# Patient Record
Sex: Female | Born: 1965 | Race: Black or African American | Hispanic: No | Marital: Married | Smoking: Never smoker
Health system: Southern US, Community
[De-identification: ages and names within clinical notes are randomized; demographics above are authoritative.]

## PROBLEM LIST (undated history)

## (undated) DIAGNOSIS — J189 Pneumonia, unspecified organism: Secondary | ICD-10-CM

## (undated) DIAGNOSIS — R0602 Shortness of breath: Secondary | ICD-10-CM

## (undated) DIAGNOSIS — K5900 Constipation, unspecified: Secondary | ICD-10-CM

## (undated) DIAGNOSIS — J3089 Other allergic rhinitis: Secondary | ICD-10-CM

## (undated) DIAGNOSIS — R5383 Other fatigue: Secondary | ICD-10-CM

## (undated) DIAGNOSIS — D649 Anemia, unspecified: Secondary | ICD-10-CM

## (undated) DIAGNOSIS — T8859XA Other complications of anesthesia, initial encounter: Secondary | ICD-10-CM

## (undated) DIAGNOSIS — M549 Dorsalgia, unspecified: Secondary | ICD-10-CM

## (undated) DIAGNOSIS — E785 Hyperlipidemia, unspecified: Secondary | ICD-10-CM

## (undated) DIAGNOSIS — K219 Gastro-esophageal reflux disease without esophagitis: Secondary | ICD-10-CM

## (undated) DIAGNOSIS — J329 Chronic sinusitis, unspecified: Secondary | ICD-10-CM

## (undated) DIAGNOSIS — T4145XA Adverse effect of unspecified anesthetic, initial encounter: Secondary | ICD-10-CM

## (undated) DIAGNOSIS — K2970 Gastritis, unspecified, without bleeding: Secondary | ICD-10-CM

## (undated) DIAGNOSIS — I499 Cardiac arrhythmia, unspecified: Secondary | ICD-10-CM

## (undated) DIAGNOSIS — E119 Type 2 diabetes mellitus without complications: Secondary | ICD-10-CM

## (undated) DIAGNOSIS — M25475 Effusion, left foot: Secondary | ICD-10-CM

## (undated) DIAGNOSIS — R51 Headache: Secondary | ICD-10-CM

## (undated) DIAGNOSIS — B9681 Helicobacter pylori [H. pylori] as the cause of diseases classified elsewhere: Secondary | ICD-10-CM

## (undated) DIAGNOSIS — J45909 Unspecified asthma, uncomplicated: Secondary | ICD-10-CM

## (undated) DIAGNOSIS — E559 Vitamin D deficiency, unspecified: Secondary | ICD-10-CM

## (undated) DIAGNOSIS — E538 Deficiency of other specified B group vitamins: Secondary | ICD-10-CM

## (undated) DIAGNOSIS — R519 Headache, unspecified: Secondary | ICD-10-CM

## (undated) DIAGNOSIS — Z87442 Personal history of urinary calculi: Secondary | ICD-10-CM

## (undated) DIAGNOSIS — Z8489 Family history of other specified conditions: Secondary | ICD-10-CM

## (undated) DIAGNOSIS — T884XXA Failed or difficult intubation, initial encounter: Secondary | ICD-10-CM

## (undated) DIAGNOSIS — M25471 Effusion, right ankle: Secondary | ICD-10-CM

## (undated) DIAGNOSIS — K469 Unspecified abdominal hernia without obstruction or gangrene: Secondary | ICD-10-CM

## (undated) DIAGNOSIS — M1990 Unspecified osteoarthritis, unspecified site: Secondary | ICD-10-CM

## (undated) DIAGNOSIS — F419 Anxiety disorder, unspecified: Secondary | ICD-10-CM

## (undated) DIAGNOSIS — M2550 Pain in unspecified joint: Secondary | ICD-10-CM

## (undated) HISTORY — DX: Dorsalgia, unspecified: M54.9

## (undated) HISTORY — DX: Pain in unspecified joint: M25.50

## (undated) HISTORY — DX: Gastritis, unspecified, without bleeding: K29.70

## (undated) HISTORY — PX: APPENDECTOMY: SHX54

## (undated) HISTORY — PX: COLONOSCOPY W/ BIOPSIES AND POLYPECTOMY: SHX1376

## (undated) HISTORY — PX: BIOPSY ENDOMETRIAL: PRO11

## (undated) HISTORY — DX: Type 2 diabetes mellitus without complications: E11.9

## (undated) HISTORY — DX: Deficiency of other specified B group vitamins: E53.8

## (undated) HISTORY — DX: Hyperlipidemia, unspecified: E78.5

## (undated) HISTORY — DX: Effusion, right ankle: M25.471

## (undated) HISTORY — DX: Shortness of breath: R06.02

## (undated) HISTORY — PX: ROTATOR CUFF REPAIR: SHX139

## (undated) HISTORY — DX: Vitamin D deficiency, unspecified: E55.9

## (undated) HISTORY — DX: Helicobacter pylori (H. pylori) as the cause of diseases classified elsewhere: B96.81

## (undated) HISTORY — DX: Other fatigue: R53.83

## (undated) HISTORY — DX: Constipation, unspecified: K59.00

## (undated) HISTORY — DX: Effusion, left foot: M25.475

---

## 2014-04-21 ENCOUNTER — Emergency Department (HOSPITAL_BASED_OUTPATIENT_CLINIC_OR_DEPARTMENT_OTHER)
Admission: EM | Admit: 2014-04-21 | Discharge: 2014-04-21 | Disposition: A | Discharge status: Home / Self Care | Payer: Managed Care, Other (non HMO) | Attending: Emergency Medicine | Admitting: Emergency Medicine

## 2014-04-21 ENCOUNTER — Encounter (HOSPITAL_BASED_OUTPATIENT_CLINIC_OR_DEPARTMENT_OTHER): Payer: Self-pay | Admitting: Emergency Medicine

## 2014-04-21 ENCOUNTER — Emergency Department (HOSPITAL_BASED_OUTPATIENT_CLINIC_OR_DEPARTMENT_OTHER): Payer: Managed Care, Other (non HMO)

## 2014-04-21 DIAGNOSIS — R102 Pelvic and perineal pain: Secondary | ICD-10-CM

## 2014-04-21 DIAGNOSIS — N949 Unspecified condition associated with female genital organs and menstrual cycle: Secondary | ICD-10-CM | POA: Insufficient documentation

## 2014-04-21 DIAGNOSIS — Z3202 Encounter for pregnancy test, result negative: Secondary | ICD-10-CM | POA: Diagnosis not present

## 2014-04-21 DIAGNOSIS — Z9089 Acquired absence of other organs: Secondary | ICD-10-CM | POA: Diagnosis not present

## 2014-04-21 DIAGNOSIS — Z9889 Other specified postprocedural states: Secondary | ICD-10-CM | POA: Diagnosis not present

## 2014-04-21 DIAGNOSIS — N926 Irregular menstruation, unspecified: Secondary | ICD-10-CM | POA: Insufficient documentation

## 2014-04-21 LAB — URINALYSIS, ROUTINE W REFLEX MICROSCOPIC
Bilirubin Urine: NEGATIVE
GLUCOSE, UA: NEGATIVE mg/dL
Hgb urine dipstick: NEGATIVE
KETONES UR: NEGATIVE mg/dL
Leukocytes, UA: NEGATIVE
Nitrite: NEGATIVE
PROTEIN: NEGATIVE mg/dL
Specific Gravity, Urine: 1.034 — ABNORMAL HIGH (ref 1.005–1.030)
Urobilinogen, UA: 1 mg/dL (ref 0.0–1.0)
pH: 6.5 (ref 5.0–8.0)

## 2014-04-21 LAB — WET PREP, GENITAL
Clue Cells Wet Prep HPF POC: NONE SEEN
TRICH WET PREP: NONE SEEN
Yeast Wet Prep HPF POC: NONE SEEN

## 2014-04-21 LAB — PREGNANCY, URINE: Preg Test, Ur: NEGATIVE

## 2014-04-21 NOTE — ED Notes (Signed)
Pt returned from [place]  Via wheelchair with tech

## 2014-04-21 NOTE — ED Provider Notes (Addendum)
CSN: [id number]     Arrival date & time 04/21/14  1643 History   First MD Initiated Contact with Patient 04/21/14 1654     Chief Complaint  Patient presents with  . Pelvic Pain     (Consider location/radiation/quality/duration/timing/severity/associated sxs/prior Treatment) HPI Complains of left-sided infraumbilical pain, nonradiating onset May 2015. Pain waxes and wanes is worse around the time of her menses. Not affected by food. Improved with urination She treated herself with Tylenol and Advil with partial relief. No nausea or vomiting. No other associated symptoms. No vaginal discharge. Last menstrual period 04/04/2014. No vaginal discharge. No change in bowel habits. History reviewed. No pertinent past medical history. Past Surgical History  Procedure Laterality Date  . Appendectomy    . Biopsy endometrial     diverticulosis No family history on file. History  Substance Use Topics  . Smoking status: Never Smoker   . Smokeless tobacco: Not on file  . Alcohol Use: Yes     Comment: occ   OB History   Grav Para Term Preterm Abortions TAB SAB Ect Mult Living                 Review of Systems  Constitutional: Negative.   HENT: Negative.   Respiratory: Negative.   Cardiovascular: Negative.   Gastrointestinal: Negative.   Genitourinary:       Pelvic pain, irregular menses  Musculoskeletal: Negative.   Skin: Negative.   Neurological: Negative.   Psychiatric/Behavioral: Negative.   All other systems reviewed and are negative.     Allergies  Fish allergy  Home Medications   Prior to Admission medications   Medication Sig Start Date End Date Taking? Authorizing Provider  Cetirizine HCl (ZYRTEC PO) Take by mouth.   Yes Historical Provider, MD   BP 145/84  Pulse 77  Temp(Src) 98.1 F (36.7 C) (Oral)  Resp 18  Ht 5\' 6"  (1.676 m)  Wt 315 lb (142.883 kg)  BMI 50.87 kg/m2  SpO2 98%  LMP 04/04/2014 Physical Exam  Nursing note and vitals reviewed. Constitutional:  She appears well-developed and well-nourished.  HENT:  Head: Normocephalic and atraumatic.  Eyes: Conjunctivae are normal. Pupils are equal, round, and reactive to light.  Neck: Neck supple. No tracheal deviation present. No thyromegaly present.  Cardiovascular: Normal rate and regular rhythm.   No murmur heard. Pulmonary/Chest: Effort normal and breath sounds normal.  Abdominal: Soft. Bowel sounds are normal. She exhibits no distension. There is no tenderness.  Morbidly obese  Genitourinary:  No external lesion cervical os closed. No cervical motion tenderness. No discharge. No blood in vault. Left adnexal tenderness present. No obvious mass  Musculoskeletal: Normal range of motion. She exhibits no edema and no tenderness.  Neurological: She is alert. Coordination normal.  Skin: Skin is warm and dry. No rash noted.  Psychiatric: She has a normal mood and affect.    ED Course  Procedures (including critical care time) Labs Review Labs Reviewed  URINALYSIS, ROUTINE W REFLEX MICROSCOPIC  PREGNANCY, URINE    Imaging Review No results found.   EKG Interpretation None     8 pM patient resting comfortably Results for orders placed during the hospital encounter of 04/21/14  WET PREP, GENITAL      Result Value Ref Range   Yeast Wet Prep HPF POC NONE SEEN  NONE SEEN   Trich, Wet Prep NONE SEEN  NONE SEEN   Clue Cells Wet Prep HPF POC NONE SEEN  NONE SEEN   WBC, Wet Prep  HPF POC FEW (*) NONE SEEN  URINALYSIS, ROUTINE W REFLEX MICROSCOPIC      Result Value Ref Range   Color, Urine YELLOW  YELLOW   APPearance CLOUDY (*) CLEAR   Specific Gravity, Urine 1.034 (*) 1.005 - 1.030   pH 6.5  5.0 - 8.0   Glucose, UA NEGATIVE  NEGATIVE mg/dL   Hgb urine dipstick NEGATIVE  NEGATIVE   Bilirubin Urine NEGATIVE  NEGATIVE   Ketones, ur NEGATIVE  NEGATIVE mg/dL   Protein, ur NEGATIVE  NEGATIVE mg/dL   Urobilinogen, UA 1.0  0.0 - 1.0 mg/dL   Nitrite NEGATIVE  NEGATIVE   Leukocytes, UA  NEGATIVE  NEGATIVE  PREGNANCY, URINE      Result Value Ref Range   Preg Test, Ur NEGATIVE  NEGATIVE   US Transvaginal Non-ob  04/21/2014   CLINICAL DATA:  Left adnexal tenderness.  EXAM: TRANSABDOMINAL ULTRASOUND OF PELVIS  DOPPLER ULTRASOUND OF OVARIES  TECHNIQUE: Transabdominal ultrasound examination of the pelvis was performed including evaluation of the uterus, ovaries, adnexal regions, and pelvic cul-de-sac.  Color and duplex Doppler ultrasound was utilized to evaluate blood flow to the ovaries.  FINDINGS: Uterus  Measurements: 10.4 x 6.4 x 6.9 cm. 1.3 x 0 1.0 x 1.5 intramural fundal fibroid.  None.  Endometrium  Thickness: 12 mm  No focal abnormality visualized.  Right ovary  Measurements: 2.6 x 2.0 x 2.3 cm Normal appearance/no adnexal mass.  Left ovary  Measurements: 2.7 x 1.8 x 2.3 cm Normal appearance/no adnexal mass. Both ovaries are difficult to visualize due to overlying bowel.  Pulsed Doppler evaluation demonstrates normal low-resistance arterial and venous waveforms in both ovaries.  IMPRESSION: 1. 1.3 cm fibroid. 2. Otherwise normal exam.  No evidence of ovarian torsion.   Electronically Signed   By: Marcello Moores  Register   On: 04/21/2014 19:05   US Pelvis Complete  04/21/2014   CLINICAL DATA:  Left adnexal tenderness.  EXAM: TRANSABDOMINAL ULTRASOUND OF PELVIS  DOPPLER ULTRASOUND OF OVARIES  TECHNIQUE: Transabdominal ultrasound examination of the pelvis was performed including evaluation of the uterus, ovaries, adnexal regions, and pelvic cul-de-sac.  Color and duplex Doppler ultrasound was utilized to evaluate blood flow to the ovaries.  FINDINGS: Uterus  Measurements: 10.4 x 6.4 x 6.9 cm. 1.3 x 0 1.0 x 1.5 intramural fundal fibroid.  None.  Endometrium  Thickness: 12 mm  No focal abnormality visualized.  Right ovary  Measurements: 2.6 x 2.0 x 2.3 cm Normal appearance/no adnexal mass.  Left ovary  Measurements: 2.7 x 1.8 x 2.3 cm Normal appearance/no adnexal mass. Both ovaries are difficult to  visualize due to overlying bowel.  Pulsed Doppler evaluation demonstrates normal low-resistance arterial and venous waveforms in both ovaries.  IMPRESSION: 1. 1.3 cm fibroid. 2. Otherwise normal exam.  No evidence of ovarian torsion.   Electronically Signed   By: Marcello Moores  Register   On: 04/21/2014 19:05   [place] Art/ven Flow Abd Pelv Doppler  04/21/2014   CLINICAL DATA:  Left adnexal tenderness.  EXAM: TRANSABDOMINAL ULTRASOUND OF PELVIS  DOPPLER ULTRASOUND OF OVARIES  TECHNIQUE: Transabdominal ultrasound examination of the pelvis was performed including evaluation of the uterus, ovaries, adnexal regions, and pelvic cul-de-sac.  Color and duplex Doppler ultrasound was utilized to evaluate blood flow to the ovaries.  FINDINGS: Uterus  Measurements: 10.4 x 6.4 x 6.9 cm. 1.3 x 0 1.0 x 1.5 intramural fundal fibroid.  None.  Endometrium  Thickness: 12 mm  No focal abnormality visualized.  Right ovary  Measurements: 2.6  x 2.0 x 2.3 cm Normal appearance/no adnexal mass.  Left ovary  Measurements: 2.7 x 1.8 x 2.3 cm Normal appearance/no adnexal mass. Both ovaries are difficult to visualize due to overlying bowel.  Pulsed Doppler evaluation demonstrates normal low-resistance arterial and venous waveforms in both ovaries.  IMPRESSION: 1. 1.3 cm fibroid. 2. Otherwise normal exam.  No evidence of ovarian torsion.   Electronically Signed   By: Marcello Moores  Register   On: 04/21/2014 19:05    MDM  Plan cultures pending. Patient requesting urine culture as she reports frequent urinary tract infections with normal UA Tylenol for pain. Patient to followup with gynecologist next month. Has scheduled appointment Final diagnoses:  None   diagnosis pelvic pain      Orlie Dakin, MD 04/21/14 2010  Orlie Dakin, MD 04/21/14 2011

## 2014-04-21 NOTE — ED Notes (Signed)
C/o pelvic pain x "few months"-GYN had plans for [place] however pt has recently moved

## 2014-04-21 NOTE — Discharge Instructions (Signed)
Take Tylenol as directed for pain. Keep your scheduled appointment with your gynecologist October. Take the disc of your ultrasound with you to your appointment

## 2014-04-22 LAB — GC/CHLAMYDIA PROBE AMP
CT Probe RNA: NEGATIVE
GC PROBE AMP APTIMA: NEGATIVE

## 2014-04-22 LAB — HIV ANTIBODY (ROUTINE TESTING W REFLEX): HIV: NONREACTIVE

## 2014-04-22 LAB — RPR

## 2014-04-23 LAB — URINE CULTURE
Colony Count: >=100000
Special Requests: NORMAL

## 2014-05-31 ENCOUNTER — Encounter (HOSPITAL_COMMUNITY): Payer: Self-pay | Admitting: Emergency Medicine

## 2014-05-31 ENCOUNTER — Emergency Department (HOSPITAL_COMMUNITY): Payer: Managed Care, Other (non HMO)

## 2014-05-31 ENCOUNTER — Emergency Department (HOSPITAL_COMMUNITY)
Admission: EM | Admit: 2014-05-31 | Discharge: 2014-05-31 | Disposition: A | Discharge status: Home / Self Care | Payer: Managed Care, Other (non HMO) | Attending: Emergency Medicine | Admitting: Emergency Medicine

## 2014-05-31 DIAGNOSIS — R109 Unspecified abdominal pain: Secondary | ICD-10-CM

## 2014-05-31 DIAGNOSIS — Z3202 Encounter for pregnancy test, result negative: Secondary | ICD-10-CM | POA: Insufficient documentation

## 2014-05-31 DIAGNOSIS — Z79899 Other long term (current) drug therapy: Secondary | ICD-10-CM | POA: Diagnosis not present

## 2014-05-31 DIAGNOSIS — Z9049 Acquired absence of other specified parts of digestive tract: Secondary | ICD-10-CM | POA: Diagnosis not present

## 2014-05-31 DIAGNOSIS — R1032 Left lower quadrant pain: Secondary | ICD-10-CM | POA: Insufficient documentation

## 2014-05-31 DIAGNOSIS — N200 Calculus of kidney: Secondary | ICD-10-CM

## 2014-05-31 LAB — COMPREHENSIVE METABOLIC PANEL
ALT: 11 U/L (ref 0–35)
ANION GAP: 14 (ref 5–15)
AST: 15 U/L (ref 0–37)
Albumin: 3.5 g/dL (ref 3.5–5.2)
Alkaline Phosphatase: 95 U/L (ref 39–117)
BILIRUBIN TOTAL: 0.3 mg/dL (ref 0.3–1.2)
BUN: 7 mg/dL (ref 6–23)
CO2: 21 meq/L (ref 19–32)
CREATININE: 0.6 mg/dL (ref 0.50–1.10)
Calcium: 9.3 mg/dL (ref 8.4–10.5)
Chloride: 104 mEq/L (ref 96–112)
GFR calc Af Amer: >90 mL/min (ref >90)
Glucose, Bld: 133 mg/dL — ABNORMAL HIGH (ref 70–99)
Potassium: 4.5 mEq/L (ref 3.7–5.3)
Sodium: 139 mEq/L (ref 137–147)
Total Protein: 8 g/dL (ref 6.0–8.3)

## 2014-05-31 LAB — POC URINE PREG, ED: Preg Test, Ur: NEGATIVE

## 2014-05-31 LAB — URINALYSIS, ROUTINE W REFLEX MICROSCOPIC
BILIRUBIN URINE: NEGATIVE
Glucose, UA: NEGATIVE mg/dL
Ketones, ur: NEGATIVE mg/dL
Leukocytes, UA: NEGATIVE
NITRITE: NEGATIVE
PH: 6 (ref 5.0–8.0)
Protein, ur: NEGATIVE mg/dL
Specific Gravity, Urine: 1.01 (ref 1.005–1.030)
Urobilinogen, UA: 0.2 mg/dL (ref 0.0–1.0)

## 2014-05-31 LAB — CBC WITH DIFFERENTIAL/PLATELET
Basophils Absolute: 0 10*3/uL (ref 0.0–0.1)
Basophils Relative: 0 % (ref 0–1)
Eosinophils Absolute: 0.1 10*3/uL (ref 0.0–0.7)
Eosinophils Relative: 2 % (ref 0–5)
HEMATOCRIT: 37.8 % (ref 36.0–46.0)
HEMOGLOBIN: 11.9 g/dL — AB (ref 12.0–15.0)
Lymphocytes Relative: 44 % (ref 12–46)
Lymphs Abs: 2.9 10*3/uL (ref 0.7–4.0)
MCH: 24.4 pg — ABNORMAL LOW (ref 26.0–34.0)
MCHC: 31.5 g/dL (ref 30.0–36.0)
MCV: 77.6 fL — ABNORMAL LOW (ref 78.0–100.0)
MONO ABS: 0.4 10*3/uL (ref 0.1–1.0)
MONOS PCT: 6 % (ref 3–12)
NEUTROS ABS: 3.1 10*3/uL (ref 1.7–7.7)
Neutrophils Relative %: 48 % (ref 43–77)
Platelets: 206 10*3/uL (ref 150–400)
RBC: 4.87 MIL/uL (ref 3.87–5.11)
RDW: 16.3 % — ABNORMAL HIGH (ref 11.5–15.5)
WBC: 6.5 10*3/uL (ref 4.0–10.5)

## 2014-05-31 LAB — URINE MICROSCOPIC-ADD ON

## 2014-05-31 MED ORDER — ONDANSETRON HCL 4 MG/2ML IJ SOLN
4.0000 mg | Freq: Once | INTRAMUSCULAR | Status: AC
  Administered 2014-05-31: 4 mg via INTRAVENOUS
  Filled 2014-05-31: qty 2

## 2014-05-31 MED ORDER — OXYCODONE-ACETAMINOPHEN 5-325 MG PO TABS: 1.0000 | ORAL_TABLET | Freq: Four times a day (QID) | ORAL | Status: DC | PRN

## 2014-05-31 MED ORDER — HYDROMORPHONE HCL 1 MG/ML IJ SOLN
1.0000 mg | Freq: Once | INTRAMUSCULAR | Status: AC
  Administered 2014-05-31: 1 mg via INTRAVENOUS
  Filled 2014-05-31: qty 1

## 2014-05-31 MED ORDER — ONDANSETRON HCL 4 MG PO TABS: 4.0000 mg | ORAL_TABLET | Freq: Four times a day (QID) | ORAL | Status: DC

## 2014-05-31 MED ORDER — TAMSULOSIN HCL 0.4 MG PO CAPS: 0.4000 mg | ORAL_CAPSULE | Freq: Every day | ORAL | Status: DC

## 2014-05-31 NOTE — Discharge Instructions (Signed)
Do not drive or operate heavy machinery for 4-6 hours after taking pain medication. °

## 2014-05-31 NOTE — ED Provider Notes (Signed)
CSN: [id number]     Arrival date & time 05/31/14  0349 History   First MD Initiated Contact with Patient 05/31/14 [phone]     Chief Complaint  Patient presents with  . Abdominal Pain  . Flank Pain     (Consider location/radiation/quality/duration/timing/severity/associated sxs/prior Treatment) HPI Comments: Patient presents today with a chief complaint of left flank pain. She reports acute onset of pain yesterday morning at 4 AM. She reports that the pain radiates to the left lower abdomen. She states that the pain has been coming in "spasms"  She describes it as sharp.  She has not taken anything for pain prior to arrival.  She has a history of kidney stones and states that the pain feels similar. Pain associated with nausea, but no vomiting or diarrhea.  No dysuria, increased frequency, or urgency.  No fever or chills.  No vaginal discharge.    The history is provided by the patient.    History reviewed. No pertinent past medical history. Past Surgical History  Procedure Laterality Date  . Appendectomy    . Biopsy endometrial     No family history on file. History  Substance Use Topics  . Smoking status: Never Smoker   . Smokeless tobacco: Not on file  . Alcohol Use: Yes     Comment: occ   OB History   Grav Para Term Preterm Abortions TAB SAB Ect Mult Living                 Review of Systems  All other systems reviewed and are negative.     Allergies  Fish allergy  Home Medications   Prior to Admission medications   Medication Sig Start Date End Date Taking? Authorizing Provider  acetaminophen (TYLENOL) 500 MG tablet Take 1,000 mg by mouth every 6 (six) hours as needed for moderate pain.   Yes Historical Provider, MD  cetirizine (ZYRTEC) 10 MG tablet Take 10 mg by mouth daily.   Yes Historical Provider, MD   BP 121/65  Pulse 63  Temp(Src) 98.1 F (36.7 C) (Oral)  Resp 20  Ht 5\' 6"  (1.676 m)  Wt 314 lb (142.429 kg)  BMI 50.70 kg/m2  SpO2 97%  LMP  04/27/2014 Physical Exam  Nursing note and vitals reviewed. Constitutional: She appears well-developed and well-nourished.  HENT:  Head: Normocephalic and atraumatic.  Mouth/Throat: Oropharynx is clear and moist.  Neck: Normal range of motion. Neck supple.  Cardiovascular: Normal rate, regular rhythm and normal heart sounds.   Pulmonary/Chest: Effort normal and breath sounds normal.  Abdominal: Soft. Bowel sounds are normal. She exhibits no distension and no mass. There is no tenderness. There is no rebound and no guarding.  Left  CVA  tenderness  Musculoskeletal: Normal range of motion.  Neurological: She is alert.  Skin: Skin is warm and dry.  Psychiatric: She has a normal mood and affect.    ED Course  Procedures (including critical care time) Labs Review Labs Reviewed  CBC WITH DIFFERENTIAL - Abnormal; Notable for the following:    Hemoglobin 11.9 (*)    MCV 77.6 (*)    MCH 24.4 (*)    RDW 16.3 (*)    All other components within normal limits  COMPREHENSIVE METABOLIC PANEL - Abnormal; Notable for the following:    Glucose, Bld 133 (*)    All other components within normal limits  URINALYSIS, ROUTINE W REFLEX MICROSCOPIC - Abnormal; Notable for the following:    Color, Urine STRAW (*)  Hgb urine dipstick TRACE (*)    All other components within normal limits  URINE MICROSCOPIC-ADD ON - Abnormal; Notable for the following:    Squamous Epithelial / LPF FEW (*)    Bacteria, UA FEW (*)    All other components within normal limits  POC URINE PREG, ED    Imaging Review Ct Renal Stone Study  05/31/2014   CLINICAL DATA:  LEFT lower abdominal pain that radiates to the back.  EXAM: CT ABDOMEN AND PELVIS WITHOUT CONTRAST  TECHNIQUE: Multidetector CT imaging of the abdomen and pelvis was performed following the standard protocol without IV contrast.  COMPARISON:  None.  FINDINGS: Unenhanced CT was performed per clinician order. Lack of IV contrast limits sensitivity and  specificity, especially for evaluation of abdominal/pelvic solid viscera.  No intrarenal or proximal ureteral calculi on either side. No evidence of hydronephrosis or other secondary signs of upper urinary tract obstruction. Within limits of unenhanced technique, normal appearing kidneys.  Again, within limits of unenhanced technique, remaining visualized upper abdomen unremarkable. Visualized extreme lung bases clear. Small umbilical hernia containing only fat. Increased body habitus.  At the UVJ on the LEFT, there is a tiny 1-2 mm nonobstructing calculus as seen on image 81 series 201, and image 85 series 202. Phleboliths are noted elsewhere in the pelvis. Suspected uterine fibroids. No appendiceal inflammation.  IMPRESSION: Nonobstructing 1-2 mm calculus LEFT UVJ. No intrarenal calculi or hydronephrosis.   Electronically Signed   By: Rolla Flatten M.D.   On: 05/31/2014 08:09     EKG Interpretation None      MDM   Final diagnoses:  Left flank pain   Patient presents today due to left flank pain.  UA did not show evidence of UTI.  Creatine WNL.  Remainder labs unremarkable aside from mild leukocytosis.  CT scan showing a 1-2 mm calculus of the left UVJ.  Pain consistent with a kidney stone.  Significant improvement in pain during ED course.  Pain and nausea controlled at time of discharge.  Patient discharged home with Rx for Percocet, Flomax, and Zofran.  Patient given referral to Urology.     Hyman Bible, PA-C 05/31/14 2309

## 2014-05-31 NOTE — ED Notes (Signed)
Attempted to start IV and draw blood unsucessful

## 2014-05-31 NOTE — ED Notes (Signed)
Pt comes from home with left lower abd pain that radiates to back since yesterday morning. C/o pain 10/10, denies dysuria and hesitancy, c/o frequency, c/o nausea denies vomiting and diarrhea.

## 2014-05-31 NOTE — ED Notes (Signed)
PA at BS.  

## 2014-06-02 NOTE — ED Provider Notes (Signed)
Medical screening examination/treatment/procedure(s) were performed by non-physician practitioner and as supervising physician I was immediately available for consultation/collaboration.   EKG Interpretation None        Merryl Hacker, MD 06/02/14 [phone]

## 2016-01-18 LAB — HEMOGLOBIN A1C: HEMOGLOBIN A1C: 6.2

## 2016-01-18 LAB — BASIC METABOLIC PANEL
BUN: 11 mg/dL (ref 4–21)
CREATININE: 0.8 mg/dL (ref 0.5–1.1)
GLUCOSE: 84 mg/dL
POTASSIUM: 4.4 mmol/L (ref 3.4–5.3)
SODIUM: 141 mmol/L (ref 137–147)

## 2016-01-18 LAB — TSH: TSH: 2.16 u[IU]/mL (ref 0.41–5.90)

## 2016-04-19 LAB — CBC AND DIFFERENTIAL
HEMATOCRIT: 32 % — AB (ref 36–46)
HEMOGLOBIN: 9.9 g/dL — AB (ref 12.0–16.0)
NEUTROS ABS: 3 /uL
WBC: 6.2 10^3/mL

## 2016-04-19 LAB — BASIC METABOLIC PANEL
BUN: 7 mg/dL (ref 4–21)
Creatinine: 0.5 mg/dL (ref 0.5–1.1)
Glucose: 113 mg/dL
Potassium: 4.4 mmol/L (ref 3.4–5.3)
Sodium: 140 mmol/L (ref 137–147)

## 2016-04-19 LAB — TSH: TSH: 2.7 u[IU]/mL (ref 0.41–5.90)

## 2016-04-19 LAB — HEPATIC FUNCTION PANEL
ALK PHOS: 99 U/L (ref 25–125)
ALT: 10 U/L (ref 7–35)
AST: 11 U/L — AB (ref 13–35)
BILIRUBIN, TOTAL: 0.2 mg/dL

## 2016-11-23 ENCOUNTER — Encounter (HOSPITAL_COMMUNITY): Payer: Self-pay | Admitting: *Deleted

## 2016-11-26 ENCOUNTER — Encounter (HOSPITAL_COMMUNITY)
Admission: RE | Disposition: A | Discharge status: Home / Self Care | Payer: Self-pay | Source: Ambulatory Visit | Attending: Gastroenterology

## 2016-11-26 ENCOUNTER — Ambulatory Visit (HOSPITAL_COMMUNITY): Payer: 59 | Admitting: Anesthesiology

## 2016-11-26 ENCOUNTER — Ambulatory Visit (HOSPITAL_COMMUNITY)
Admission: RE | Admit: 2016-11-26 | Discharge: 2016-11-26 | Disposition: A | Discharge status: Home / Self Care | Payer: 59 | Source: Ambulatory Visit | Attending: Gastroenterology | Admitting: Gastroenterology

## 2016-11-26 ENCOUNTER — Encounter (HOSPITAL_COMMUNITY): Payer: Self-pay

## 2016-11-26 DIAGNOSIS — K2950 Unspecified chronic gastritis without bleeding: Secondary | ICD-10-CM | POA: Diagnosis not present

## 2016-11-26 DIAGNOSIS — R140 Abdominal distension (gaseous): Secondary | ICD-10-CM | POA: Diagnosis present

## 2016-11-26 DIAGNOSIS — D649 Anemia, unspecified: Secondary | ICD-10-CM | POA: Diagnosis not present

## 2016-11-26 DIAGNOSIS — Z8049 Family history of malignant neoplasm of other genital organs: Secondary | ICD-10-CM | POA: Insufficient documentation

## 2016-11-26 DIAGNOSIS — Z8249 Family history of ischemic heart disease and other diseases of the circulatory system: Secondary | ICD-10-CM | POA: Insufficient documentation

## 2016-11-26 DIAGNOSIS — K449 Diaphragmatic hernia without obstruction or gangrene: Secondary | ICD-10-CM | POA: Diagnosis not present

## 2016-11-26 DIAGNOSIS — Z91013 Allergy to seafood: Secondary | ICD-10-CM | POA: Diagnosis not present

## 2016-11-26 DIAGNOSIS — R7303 Prediabetes: Secondary | ICD-10-CM | POA: Insufficient documentation

## 2016-11-26 DIAGNOSIS — F410 Panic disorder [episodic paroxysmal anxiety] without agoraphobia: Secondary | ICD-10-CM | POA: Diagnosis not present

## 2016-11-26 DIAGNOSIS — K219 Gastro-esophageal reflux disease without esophagitis: Secondary | ICD-10-CM | POA: Insufficient documentation

## 2016-11-26 DIAGNOSIS — Z800 Family history of malignant neoplasm of digestive organs: Secondary | ICD-10-CM | POA: Insufficient documentation

## 2016-11-26 DIAGNOSIS — I499 Cardiac arrhythmia, unspecified: Secondary | ICD-10-CM | POA: Diagnosis not present

## 2016-11-26 DIAGNOSIS — Z820 Family history of epilepsy and other diseases of the nervous system: Secondary | ICD-10-CM | POA: Insufficient documentation

## 2016-11-26 DIAGNOSIS — Z86010 Personal history of colonic polyps: Secondary | ICD-10-CM | POA: Diagnosis not present

## 2016-11-26 DIAGNOSIS — Z79899 Other long term (current) drug therapy: Secondary | ICD-10-CM | POA: Diagnosis not present

## 2016-11-26 DIAGNOSIS — B9681 Helicobacter pylori [H. pylori] as the cause of diseases classified elsewhere: Secondary | ICD-10-CM | POA: Insufficient documentation

## 2016-11-26 DIAGNOSIS — Z87442 Personal history of urinary calculi: Secondary | ICD-10-CM | POA: Diagnosis not present

## 2016-11-26 DIAGNOSIS — R51 Headache: Secondary | ICD-10-CM | POA: Diagnosis not present

## 2016-11-26 DIAGNOSIS — Z833 Family history of diabetes mellitus: Secondary | ICD-10-CM | POA: Insufficient documentation

## 2016-11-26 DIAGNOSIS — M1990 Unspecified osteoarthritis, unspecified site: Secondary | ICD-10-CM | POA: Insufficient documentation

## 2016-11-26 DIAGNOSIS — J45909 Unspecified asthma, uncomplicated: Secondary | ICD-10-CM | POA: Diagnosis not present

## 2016-11-26 HISTORY — DX: Unspecified osteoarthritis, unspecified site: M19.90

## 2016-11-26 HISTORY — DX: Gastro-esophageal reflux disease without esophagitis: K21.9

## 2016-11-26 HISTORY — DX: Anxiety disorder, unspecified: F41.9

## 2016-11-26 HISTORY — DX: Other complications of anesthesia, initial encounter: T88.59XA

## 2016-11-26 HISTORY — DX: Chronic sinusitis, unspecified: J32.9

## 2016-11-26 HISTORY — DX: Anemia, unspecified: D64.9

## 2016-11-26 HISTORY — DX: Headache: R51

## 2016-11-26 HISTORY — DX: Adverse effect of unspecified anesthetic, initial encounter: T41.45XA

## 2016-11-26 HISTORY — DX: Headache, unspecified: R51.9

## 2016-11-26 HISTORY — DX: Pneumonia, unspecified organism: J18.9

## 2016-11-26 HISTORY — DX: Failed or difficult intubation, initial encounter: T88.4XXA

## 2016-11-26 HISTORY — DX: Family history of other specified conditions: Z84.89

## 2016-11-26 HISTORY — DX: Cardiac arrhythmia, unspecified: I49.9

## 2016-11-26 HISTORY — DX: Unspecified asthma, uncomplicated: J45.909

## 2016-11-26 HISTORY — DX: Personal history of urinary calculi: Z87.442

## 2016-11-26 HISTORY — PX: ESOPHAGOGASTRODUODENOSCOPY (EGD) WITH PROPOFOL: SHX5813

## 2016-11-26 HISTORY — DX: Other allergic rhinitis: J30.89

## 2016-11-26 HISTORY — DX: Unspecified abdominal hernia without obstruction or gangrene: K46.9

## 2016-11-26 LAB — GLUCOSE, CAPILLARY: Glucose-Capillary: 131 mg/dL — ABNORMAL HIGH (ref 65–99)

## 2016-11-26 SURGERY — ESOPHAGOGASTRODUODENOSCOPY (EGD) WITH PROPOFOL
Anesthesia: Monitor Anesthesia Care

## 2016-11-26 MED ORDER — PANTOPRAZOLE SODIUM 20 MG PO TBEC: 20.0000 mg | DELAYED_RELEASE_TABLET | Freq: Every day | ORAL | Status: DC

## 2016-11-26 MED ORDER — PROPOFOL 10 MG/ML IV BOLUS
INTRAVENOUS | Status: DC | PRN
  Administered 2016-11-26: 20 mg via INTRAVENOUS

## 2016-11-26 MED ORDER — PROPOFOL 500 MG/50ML IV EMUL
INTRAVENOUS | Status: DC | PRN
  Administered 2016-11-26: 100 ug/kg/min via INTRAVENOUS

## 2016-11-26 MED ORDER — LACTATED RINGERS IV SOLN
INTRAVENOUS | Status: DC
  Administered 2016-11-26: 1000 mL via INTRAVENOUS

## 2016-11-26 MED ORDER — PANTOPRAZOLE SODIUM 20 MG PO TBEC: 20.0000 mg | DELAYED_RELEASE_TABLET | Freq: Every day | ORAL | 1 refills | Status: DC

## 2016-11-26 MED ORDER — SODIUM CHLORIDE 0.9 % IV SOLN: INTRAVENOUS | Status: DC

## 2016-11-26 SURGICAL SUPPLY — 14 items

## 2016-11-26 NOTE — Op Note (Signed)
[facility] Patient Name: Tanya Houston Procedure Date : 11/26/2016 MRN: [id number] Attending MD: Otis Brace , MD Date of Birth: 01/24/1966 CSN: [id number] Age: 51 Admit Type: Outpatient Procedure:                Upper GI endoscopy Indications:              Suspected gastro-esophageal reflux disease,                            Abdominal bloating Providers:                Otis Brace, MD, Carolynn Comment, RN, Cherylynn Ridges, Technician, Rhae Lerner, CRNA Referring MD:              Medicines:                Sedation Administered by an Anesthesia Professional Complications:            No immediate complications. Estimated Blood Loss:     Estimated blood loss was minimal. Procedure:                Pre-Anesthesia Assessment:                           - Prior to the procedure, a History and Physical                            was performed, and patient medications and                            allergies were reviewed. The patient's tolerance of                            previous anesthesia was also reviewed. The risks                            and benefits of the procedure and the sedation                            options and risks were discussed with the patient.                            All questions were answered, and informed consent                            was obtained. Prior Anticoagulants: The patient has                            taken no previous anticoagulant or antiplatelet                            agents. ASA Grade Assessment: III - A patient with  severe systemic disease. After reviewing the risks                            and benefits, the patient was deemed in                            satisfactory condition to undergo the procedure.                           After obtaining informed consent, the endoscope was                            passed under direct vision. Throughout the                           procedure, the patient's blood pressure, pulse, and                            oxygen saturations were monitored continuously. The                            [id number] ([id number]) scope was introduced through the                            mouth, and advanced to the second part of duodenum.                            The upper GI endoscopy was accomplished without                            difficulty. The patient tolerated the procedure                            well. Scope In: Scope Out: Findings:      The Z-line was regular and was found 39 cm from the incisors.      A small hiatal hernia was present.      Scattered mild inflammation characterized by erythema and friability was       found in the gastric antrum. Biopsies were taken with a cold forceps for       Helicobacter pylori testing.      The cardia and gastric fundus were normal on retroflexion except for       poor distention.      The duodenal bulb, first portion of the duodenum and second portion of       the duodenum were normal. Impression:               - Z-line regular, 39 cm from the incisors.                           - Small hiatal hernia.                           - Gastritis. Biopsied.                           -  Normal duodenal bulb, first portion of the                            duodenum and second portion of the duodenum. Moderate Sedation:      Moderate (conscious) sedation was personally administered by an       anesthesia professional. The following parameters were monitored: oxygen       saturation, heart rate, blood pressure, and response to care. Recommendation:           - Patient has a contact number available for                            emergencies. The signs and symptoms of potential                            delayed complications were discussed with the                            patient. Return to normal activities tomorrow.                            Written discharge  instructions were provided to the                            patient.                           - Resume previous diet.                           - Continue present medications.                           - Await pathology results.                           - Return to my office as previously scheduled.                           - Discharge patient to home (ambulatory). Procedure Code(s):        --- Professional ---                           [phone], Esophagogastroduodenoscopy, flexible,                            transoral; with biopsy, single or multiple Diagnosis Code(s):        --- Professional ---                           K44.9, Diaphragmatic hernia without obstruction or                            gangrene                           K29.70, Gastritis, unspecified, without bleeding  R14.0, Abdominal distension (gaseous) CPT copyright 2016 [employer]. All rights reserved. The codes documented in this report are preliminary and upon coder review may  be revised to meet current compliance requirements. Otis Brace, MD Otis Brace, MD 11/26/2016 8:34:21 AM Number of Addenda: 0

## 2016-11-26 NOTE — Anesthesia Preprocedure Evaluation (Addendum)
Anesthesia Evaluation  Patient identified by MRN, date of birth, ID band Patient awake    Reviewed: Allergy & Precautions, NPO status , Patient's Chart, lab work & pertinent test results  Airway Mallampati: II       Dental  (+) Teeth Intact   Pulmonary asthma ,    breath sounds clear to auscultation       Cardiovascular  Rhythm:Regular Rate:Normal     Neuro/Psych    GI/Hepatic GERD  ,  Endo/Other  Morbid obesity  Renal/GU      Musculoskeletal   Abdominal   Peds  Hematology   Anesthesia Other Findings   Reproductive/Obstetrics                           Anesthesia Physical Anesthesia Plan  ASA: II  Anesthesia Plan: MAC   Post-op Pain Management:    Induction: Intravenous  Airway Management Planned: Nasal Cannula  Additional Equipment:   Intra-op Plan:   Post-operative Plan:   Informed Consent: I have reviewed the patients History and Physical, chart, labs and discussed the procedure including the risks, benefits and alternatives for the proposed anesthesia with the patient or authorized representative who has indicated his/her understanding and acceptance.   Dental advisory given  Plan Discussed with: CRNA and Anesthesiologist  Anesthesia Plan Comments:         Anesthesia Quick Evaluation

## 2016-11-26 NOTE — H&P (Signed)
Reason for Admission. GERD  HPI: Tanya Houston is a 51 y.o. female here for EGD for further evaluation of acid reflux along with abdominal bloating. Patient with long-standing acid reflux currently not on any PPI secondary to concerns for side effects. Denied dysphagia or odynophagia  Past Medical History:  Diagnosis Date  . Anemia   . Anxiety    panic attacks  . Arthritis   . Asthma   . Borderline diabetes   . Complication of anesthesia    difficulty breathing for a few a minutes after a procedure  . Difficult intubation    pt unsure   . Environmental and seasonal allergies   . Family history of adverse reaction to anesthesia    " my son hard a hard time waking up "  . GERD (gastroesophageal reflux disease)   . Headache   . Hernia, abdominal   . History of kidney stones   . Irregular heartbeat   . Pneumonia   . Recurrent sinus infections     Past Surgical History:  Procedure Laterality Date  . APPENDECTOMY    . BIOPSY ENDOMETRIAL    . COLONOSCOPY W/ BIOPSIES AND POLYPECTOMY    . ROTATOR CUFF REPAIR      Prior to Admission medications   Medication Sig Start Date End Date Taking? Authorizing Provider  cetirizine (ZYRTEC) 10 MG tablet Take 10 mg by mouth daily.   Yes Historical Provider, MD  acetaminophen (TYLENOL) 500 MG tablet Take 1,000 mg by mouth every 6 (six) hours as needed for moderate pain.    Historical Provider, MD  ondansetron (ZOFRAN) 4 MG tablet Take 1 tablet (4 mg total) by mouth every 6 (six) hours. 05/31/14   Hyman Bible, PA-C  oxyCODONE-acetaminophen (PERCOCET/ROXICET) 5-325 MG per tablet Take 1-2 tablets by mouth every 6 (six) hours as needed for severe pain. 05/31/14   Heather Laisure, PA-C  tamsulosin (FLOMAX) 0.4 MG CAPS capsule Take 1 capsule (0.4 mg total) by mouth daily. 05/31/14   Hyman Bible, PA-C    Scheduled Meds: Continuous Infusions: . lactated ringers 1,000 mL (11/26/16 0737)   PRN Meds:.  Allergies as of 10/26/2016 -  Review Complete 05/31/2014  Allergen Reaction Noted  . Fish allergy Shortness Of Breath and Itching 04/21/2014    Family History  Problem Relation Age of Onset  . Diabetes Mother   . Heart attack Mother   . Colon cancer Father   . Cervical cancer Sister   . Diabetes Sister   . Heart attack Sister   . Epilepsy Brother     Social History   Social History  . Marital status: Married    Spouse name: N/A  . Number of children: N/A  . Years of education: N/A   Occupational History  . Not on file.   Social History Main Topics  . Smoking status: Never Smoker  . Smokeless tobacco: Never Used  . Alcohol use Yes     Comment: occ  . Drug use: No  . Sexual activity: Not on file   Other Topics Concern  . Not on file   Social History Narrative  . No narrative on file    Review of Systems: All negative except as stated above in HPI.  Physical Exam: Vital signs: Vitals:   11/26/16 0716  BP: (!) 119/58  Pulse: 87  Resp: 18  Temp: 98.2 F (36.8 C)     General:   Obese well-nourished, pleasant and cooperative in NAD Lungs:  Clear throughout to  auscultation.   No wheezes, crackles, or rhonchi. No acute distress. Heart:  Regular rate and rhythm; no murmurs, clicks, rubs,  or gallops. Abdomen: Soft, nontender, nondistended, bowel sounds present Rectal:  Deferred  GI:  Lab Results: No results for input(s): WBC, HGB, HCT, PLT in the last 72 hours. BMET No results for input(s): NA, K, CL, CO2, GLUCOSE, BUN, CREATININE, CALCIUM in the last 72 hours. LFT No results for input(s): PROT, ALBUMIN, AST, ALT, ALKPHOS, BILITOT, BILIDIR, IBILI in the last 72 hours. PT/INR No results for input(s): LABPROT, INR in the last 72 hours.   Studies/Results: No results found.  Impression/Plan: - Long-standing acid reflux - Abdominal bloating - Obesity  Recommendations ------------------------- - EGD today for further evaluation. Risk benefits alternatives discussed with the  patient. She verbalized understanding. - Further plan based on endoscopic finding.    LOS: 0 days   Otis Brace  MD, FACP 11/26/2016, 8:10 AM  Pager [phone] If no answer or after 5 PM call [phone]

## 2016-11-26 NOTE — Anesthesia Procedure Notes (Signed)
Procedure Name: MAC Date/Time: 11/26/2016 8:12 AM Performed by: Kyung Rudd Pre-anesthesia Checklist: Patient identified, Emergency Drugs available, Suction available and Patient being monitored Patient Re-evaluated:Patient Re-evaluated prior to inductionOxygen Delivery Method: Nasal cannula Intubation Type: IV induction Placement Confirmation: positive ETCO2 Dental Injury: Teeth and Oropharynx as per pre-operative assessment

## 2016-11-26 NOTE — Discharge Instructions (Signed)

## 2016-11-26 NOTE — Anesthesia Postprocedure Evaluation (Signed)
Anesthesia Post Note  Patient: Tanya Houston  Procedure(s) Performed: Procedure(s) (LRB): ESOPHAGOGASTRODUODENOSCOPY (EGD) WITH PROPOFOL (N/A)  Patient location during evaluation: PACU Anesthesia Type: MAC Level of consciousness: awake Pain management: pain level controlled Vital Signs Assessment: post-procedure vital signs reviewed and stable Respiratory status: spontaneous breathing Cardiovascular status: stable Anesthetic complications: no       Last Vitals:  Vitals:   11/26/16 0850 11/26/16 0900  BP: 111/68 117/76  Pulse: 70 64  Resp: (!) 23 (!) 25  Temp:      Last Pain:  Vitals:   11/26/16 0835  TempSrc: Oral                 [name]

## 2016-11-26 NOTE — Anesthesia Postprocedure Evaluation (Signed)
Anesthesia Post Note  Patient: Tanya Houston  Procedure(s) Performed: Procedure(s) (LRB): ESOPHAGOGASTRODUODENOSCOPY (EGD) WITH PROPOFOL (N/A)  Patient location during evaluation: PACU Anesthesia Type: MAC Level of consciousness: awake Pain management: pain level controlled Vital Signs Assessment: post-procedure vital signs reviewed and stable Respiratory status: spontaneous breathing Cardiovascular status: stable Anesthetic complications: no       Last Vitals:  Vitals:   11/26/16 0850 11/26/16 0900  BP: 111/68 117/76  Pulse: 70 64  Resp: (!) 23 (!) 25  Temp:      Last Pain:  Vitals:   11/26/16 0835  TempSrc: Oral                 Jerrard Bradburn

## 2016-11-26 NOTE — Transfer of Care (Signed)
Immediate Anesthesia Transfer of Care Note  Patient: Tanya Houston  Procedure(s) Performed: Procedure(s): ESOPHAGOGASTRODUODENOSCOPY (EGD) WITH PROPOFOL (N/A)  Patient Location: Endoscopy Unit  Anesthesia Type:MAC  Level of Consciousness: awake, alert  and oriented  Airway & Oxygen Therapy: Patient Spontanous Breathing and Patient connected to nasal cannula oxygen  Post-op Assessment: Report given to RN, Post -op Vital signs reviewed and stable and Patient moving all extremities X 4  Post vital signs: Reviewed and stable  Last Vitals:  Vitals:   11/26/16 0716  BP: (!) 119/58  Pulse: 87  Resp: 18  Temp: 36.8 C    Last Pain:  Vitals:   11/26/16 0716  TempSrc: Oral         Complications: No apparent anesthesia complications

## 2016-11-26 NOTE — Anesthesia Preprocedure Evaluation (Signed)
Anesthesia Evaluation Anesthesia Physical Anesthesia Plan Anesthesia Quick Evaluation  

## 2016-12-03 ENCOUNTER — Ambulatory Visit: Payer: 59 | Admitting: Family Medicine

## 2016-12-10 ENCOUNTER — Ambulatory Visit (INDEPENDENT_AMBULATORY_CARE_PROVIDER_SITE_OTHER): Payer: 59 | Admitting: Family Medicine

## 2016-12-10 ENCOUNTER — Encounter: Payer: Self-pay | Admitting: Family Medicine

## 2016-12-10 VITALS — BP 110/80 | HR 79 | Resp 12 | Ht 66.0 in | Wt 310.1 lb

## 2016-12-10 DIAGNOSIS — Z6841 Body Mass Index (BMI) 40.0 and over, adult: Secondary | ICD-10-CM | POA: Diagnosis not present

## 2016-12-10 DIAGNOSIS — R51 Headache: Secondary | ICD-10-CM | POA: Diagnosis not present

## 2016-12-10 DIAGNOSIS — R739 Hyperglycemia, unspecified: Secondary | ICD-10-CM

## 2016-12-10 DIAGNOSIS — E1165 Type 2 diabetes mellitus with hyperglycemia: Secondary | ICD-10-CM | POA: Diagnosis not present

## 2016-12-10 DIAGNOSIS — J309 Allergic rhinitis, unspecified: Secondary | ICD-10-CM | POA: Diagnosis not present

## 2016-12-10 DIAGNOSIS — L819 Disorder of pigmentation, unspecified: Secondary | ICD-10-CM

## 2016-12-10 DIAGNOSIS — R519 Headache, unspecified: Secondary | ICD-10-CM

## 2016-12-10 LAB — MICROALBUMIN / CREATININE URINE RATIO
Creatinine,U: 262.9 mg/dL
Microalb Creat Ratio: 0.7 mg/g (ref 0.0–30.0)
Microalb, Ur: 1.8 mg/dL (ref 0.0–1.9)

## 2016-12-10 LAB — HEMOGLOBIN A1C: HEMOGLOBIN A1C: 7.6 % — AB (ref 4.6–6.5)

## 2016-12-10 LAB — LIPID PANEL
CHOLESTEROL: 225 mg/dL — AB (ref 0–200)
HDL: 55.2 mg/dL (ref >39.00)
LDL CALC: 151 mg/dL — AB (ref 0–99)
NonHDL: 169.49
Total CHOL/HDL Ratio: 4
Triglycerides: 93 mg/dL (ref 0.0–149.0)
VLDL: 18.6 mg/dL (ref 0.0–40.0)

## 2016-12-10 NOTE — Progress Notes (Signed)
HPI:   [name] is a 51 y.o. female, who is here today to establish care with me.  Former PCP: Dr Simona Huh, [place] Last preventive routine visit: 01/2016 She follows with gyn and has one in town.   Chronic medical problems: DM II, H. Pylori infection. She is currently following with gastroenterologist (At [facility]), recently it was recommended treatment for H. pylori. She discontinue abx and PPI because exacerbation of GI symptoms. She tells me that she is not willing to take any treatment, she tells me that she has read online that some people can have a positive H. pylori test without disease. She does not feel like this is causing symptoms for which she was initially evaluated, LUQ pain.   Concerns today: She has a few concerns today,some have been already addressed by former PCP and following with specialists.   -Reporting recurrent vaginal yeast infections and groin erythematous rash for the past few months, feeling better now,she follows with her gyn. HgA1C was done recently at her gyn's office, 7.5 (about 2 weeks ago). Hx of IFG. She denies abdominal pain, nausea, vomiting, polyuria, polydipsia, polyphagia.  -Obesity: She does not exercise regularly. She works 3rd shift, so due to work schedule she eats once daily for the past month, has lost about 4 Lb in the past week.  Pruritic generalized rash for a few months, residual pigmentation changes. Rash started on back, initially red and scaly. She is already following with dermatologists. According to patient, diagnosis of psoriasis was mention, biopsy result is pending.  She also has intermittent "hives",pruritic rash. She follows with immunologists, according to pt, she was instructed to take Benadryl,Allegra, and Zyrtec but has not done so consistently.She also uses Flonase nasal spray as needed.  + Hx if allergic rhinitis: Postnasal drainage, rhinorrhea, nasal congestion. Intermittent dizziness,  exacerbated with standing up fast, lasts a couple seconds. No earache or hearing loss. No fever or chills.  Headache: For the past few days she has had pressure, mild frontal and occipital headache, she attributes it to "sinus issues." She took Ibuprofen yesterday and has helped. She denies visual changes, associated nausea or vomiting, or focal weakness. + Stress.   Review of Systems  Constitutional: Positive for fatigue. Negative for activity change, appetite change and fever.  HENT: Positive for congestion, postnasal drip, rhinorrhea and sinus pressure. Negative for ear pain, hearing loss, mouth sores, nosebleeds, sore throat, trouble swallowing and voice change.   Eyes: Positive for itching. Negative for redness and visual disturbance.  Respiratory: Negative for cough, shortness of breath and wheezing.   Cardiovascular: Negative for chest pain, palpitations and leg swelling.  Gastrointestinal: Negative for blood in stool, nausea and vomiting.       Negative for changes in bowel habits.  Endocrine: Negative for polydipsia, polyphagia and polyuria.  Genitourinary: Negative for decreased urine volume, dysuria and hematuria.  Musculoskeletal: Negative for gait problem and myalgias.  Skin: Positive for rash. Negative for wound.  Allergic/Immunologic: Positive for environmental allergies.  Neurological: Positive for dizziness and headaches. Negative for syncope, weakness and numbness.  Psychiatric/Behavioral: Negative for confusion. The patient is nervous/anxious.       Current Outpatient Prescriptions on File Prior to Visit  Medication Sig Dispense Refill  . cetirizine (ZYRTEC) 10 MG tablet Take 10 mg by mouth daily.     No current facility-administered medications on file prior to visit.      Past Medical History:  Diagnosis Date  . Anemia   .  Anxiety    panic attacks  . Arthritis   . Asthma   . Borderline diabetes   . Complication of anesthesia    difficulty breathing for  a few a minutes after a procedure  . Difficult intubation    pt unsure   . Environmental and seasonal allergies   . Family history of adverse reaction to anesthesia    " my son hard a hard time waking up "  . GERD (gastroesophageal reflux disease)   . Headache   . Hernia, abdominal   . History of kidney stones   . Irregular heartbeat   . Pneumonia   . Recurrent sinus infections    Allergies  Allergen Reactions  . Adhesive [Tape] Hives    And Band Aids  . Fish Allergy Shortness Of Breath and Itching  . Latex Hives  . Sulfa Antibiotics Hives    Family History  Problem Relation Age of Onset  . Diabetes Mother   . Heart attack Mother   . Colon cancer Father   . Cervical cancer Sister   . Diabetes Sister   . Heart attack Sister   . Epilepsy Brother     Social History   Social History  . Marital status: Married    Spouse name: N/A  . Number of children: N/A  . Years of education: N/A   Social History Main Topics  . Smoking status: Never Smoker  . Smokeless tobacco: Never Used  . Alcohol use Yes     Comment: occ  . Drug use: No  . Sexual activity: Not Asked   Other Topics Concern  . None   Social History Narrative  . None    Vitals:   12/10/16 0941  BP: 110/80  Pulse: 79  Resp: 12  O2 sat at RA 97% Body mass index is 50.06 kg/m.   Physical Exam  Nursing note and vitals reviewed. Constitutional: She is oriented to person, place, and time. She appears well-developed. No distress.  HENT:  Head: Atraumatic.  Right Ear: Tympanic membrane, external ear and ear canal normal.  Left Ear: Tympanic membrane, external ear and ear canal normal.  Nose: Septal deviation present. Right sinus exhibits no maxillary sinus tenderness and no frontal sinus tenderness. Left sinus exhibits no maxillary sinus tenderness and no frontal sinus tenderness.  Mouth/Throat: Oropharynx is clear and moist and mucous membranes are normal.  Post nasal drainage,hypertrophic  turbinates.  Eyes: Conjunctivae and EOM are normal. Pupils are equal, round, and reactive to light.  Neck: No tracheal deviation present. No thyroid mass and no thyromegaly present.  Cardiovascular: Normal rate and regular rhythm.   No murmur heard. Pulses:      Dorsalis pedis pulses are 2+ on the right side, and 2+ on the left side.  Respiratory: Effort normal and breath sounds normal. No respiratory distress.  GI: Soft. She exhibits no mass. There is no hepatomegaly. There is no tenderness.  Musculoskeletal: She exhibits edema (1+ pitting LE and pedal edema,bilateral).       Cervical back: She exhibits normal range of motion and no bony tenderness.  Tenderness upon palpation of the cervical paraspinal muscles, bilateral. Also pain upon palpation of the scalp and occipital and parietal area.  Lymphadenopathy:    She has no cervical adenopathy.  Neurological: She is alert and oriented to person, place, and time. She has normal strength. No cranial nerve deficit. Coordination and gait normal.  Skin: Skin is warm. No rash noted. No erythema.  Hyperpigmented lesions  scattered on abdomen,back. No erythematous rash appreciated.  Psychiatric: Her mood appears anxious.  Well groomed, good eye contact.     ASSESSMENT AND PLAN:   Jamyia was seen today for establish care.  Diagnoses and all orders for this visit:  Headache, unspecified headache type  Possible etiologies discussed, certainly allergies can cause frontal headache. He has improved.  Cervical muscles massage and local heat may help. She has a wig, it could also aggravate headache. ?Tension headache. Instructed about warning signs. Follow-up as needed.  Hyperglycemia  Repeat A1c today to confirm diagnosis.  At the time of the visit I don't have a copy of present A1c.  -     HgB A1c  Uncontrolled type 2 diabetes mellitus with hyperglycemia, without long-term current use of insulin ([place])  New Dx. Eye exam  07/2016. Further recommendations in regard to pharmacologic treatment would be given according to A1c results. Regular exercise and healthy diet with avoidance of added sugar food intake is an important part of treatment and recommended. Annual eye exam, periodic dental and foot care recommended. F/U in 72months  -     Fructosamine -     Microalbumin / creatinine urine ratio  Morbid obesity with BMI of 50.0-59.9, adult (HCC)  We discussed benefits of wt loss as well as adverse effects of obesity. Consistency with healthy diet and physical activity recommended. Daily brisk walking for 15-30 min as tolerated.  -     Lipid panel  Allergic rhinitis, unspecified seasonality, unspecified trigger  Continue with Flonase nasal spray, nasal saline drops as needed. Zyrtec 10 mg daily. Continue following with immunologists.  Postinflammatory pigmentary changes  Lesions seem to be residual changes to rash she had a few weeks ago. Recommend continuing to follow with dermatologist.      Ludmila Ebarb G. [place], MD  [facility]. [place] office.

## 2016-12-10 NOTE — Progress Notes (Signed)
Pre visit review using our clinic review tool, if applicable. No additional management support is needed unless otherwise documented below in the visit note. 

## 2016-12-10 NOTE — Patient Instructions (Addendum)
A few things to remember from today's visit:   Headache, unspecified headache type  Hyperglycemia - Plan: HgB A1c  Uncontrolled type 2 diabetes mellitus with hyperglycemia, without long-term current use of insulin (HCC) - Plan: Fructosamine, Microalbumin / creatinine urine ratio  Morbid obesity with BMI of 50.0-59.9, adult ([place]) - Plan: Lipid panel  ? Tension headache.  HgA1C goal < 7.0. Avoid sugar added food:regular soft drinks, energy drinks, and sports drinks. candy. cakes. cookies. pies and cobblers. sweet rolls, pastries, and donuts. fruit drinks, such as fruitades and fruit punch. dairy desserts, such as ice cream  Mediterranean diet has showed benefits for sugar control.  How much and what type of carbohydrate foods are important for managing diabetes. The balance between how much insulin is in your body and the carbohydrate you eat makes a difference in your blood glucose levels.  Fasting blood sugar ideally 130 or less, 2 hours after meals less than 180.   Regular exercise also will help with controlling disease, daily brisk walking as tolerated for 15-30 min definitively will help.   Avoid skipping meals, blood sugar might drop and cause serious problems. Remember checking feet periodically, good dental hygiene, and annual eye exam.    Please be sure medication list is accurate. If a new problem present, please set up appointment sooner than planned today.

## 2016-12-12 LAB — FRUCTOSAMINE: FRUCTOSAMINE: 220 umol/L (ref 190–270)

## 2016-12-13 DIAGNOSIS — E119 Type 2 diabetes mellitus without complications: Secondary | ICD-10-CM | POA: Insufficient documentation

## 2016-12-13 DIAGNOSIS — E1165 Type 2 diabetes mellitus with hyperglycemia: Secondary | ICD-10-CM | POA: Insufficient documentation

## 2016-12-18 ENCOUNTER — Other Ambulatory Visit: Payer: Self-pay

## 2016-12-18 MED ORDER — METFORMIN HCL 500 MG PO TABS: 500.0000 mg | ORAL_TABLET | Freq: Two times a day (BID) | ORAL | 3 refills | Status: DC

## 2016-12-27 ENCOUNTER — Ambulatory Visit (INDEPENDENT_AMBULATORY_CARE_PROVIDER_SITE_OTHER): Payer: 59 | Admitting: Obstetrics and Gynecology

## 2016-12-27 ENCOUNTER — Encounter: Payer: Self-pay | Admitting: Obstetrics and Gynecology

## 2016-12-27 VITALS — BP 122/68 | HR 84 | Resp 18 | Ht 66.0 in | Wt 307.0 lb

## 2016-12-27 DIAGNOSIS — R109 Unspecified abdominal pain: Secondary | ICD-10-CM

## 2016-12-27 DIAGNOSIS — R238 Other skin changes: Secondary | ICD-10-CM | POA: Diagnosis not present

## 2016-12-27 DIAGNOSIS — B372 Candidiasis of skin and nail: Secondary | ICD-10-CM

## 2016-12-27 DIAGNOSIS — R21 Rash and other nonspecific skin eruption: Secondary | ICD-10-CM | POA: Diagnosis not present

## 2016-12-27 DIAGNOSIS — N898 Other specified noninflammatory disorders of vagina: Secondary | ICD-10-CM | POA: Diagnosis not present

## 2016-12-27 DIAGNOSIS — L292 Pruritus vulvae: Secondary | ICD-10-CM

## 2016-12-27 DIAGNOSIS — Z87440 Personal history of urinary (tract) infections: Secondary | ICD-10-CM | POA: Diagnosis not present

## 2016-12-27 MED ORDER — NYSTATIN 100000 UNIT/GM EX POWD: Freq: Three times a day (TID) | CUTANEOUS | 2 refills | Status: DC

## 2016-12-27 MED ORDER — BETAMETHASONE VALERATE 0.1 % EX OINT: TOPICAL_OINTMENT | CUTANEOUS | 0 refills | Status: DC

## 2016-12-27 MED ORDER — TERCONAZOLE 0.4 % VA CREA: 1.0000 | TOPICAL_CREAM | Freq: Every day | VAGINAL | 1 refills | Status: DC

## 2016-12-27 NOTE — Progress Notes (Addendum)
51 y.o. [id number] MarriedAfrican AmericanF here for chronic yeast infections. She states she has chronic candida intertrigo and yeast vaginitis. She scratches, her skin tears and gets more sore.  She has seen a dermatologist, she thought it might be psoriasis. She did have a + biopsy for yeast. The medication she was given made the tissue more irritated (ketoconazole cream). Terazol cream has helped her vagina and her skin issues.  Her issues with recurrent yeast infections have been going on for the last year. The current vaginal symptoms started in the last week, itching in the clitoral region.  The skin area is worse than the vaginal issues. Not sure if she has had a vaginal culture before.  She has diabetes, recent HgbA1C was 7.6. She has lost 10 lbs since that visit.  Not sexually active for the last 7 months because of all of the skin issues.  The patient has a h/o recurrent UTI's, takes macrobid with intercourse. States her symptoms typically start with mild flank pain and she is having that now. Requests urine culture     Patient's last menstrual period was 11/22/2016.          Sexually active: Yes.   not in the past 6 months The current method of family planning is none.    Exercising: Yes.    walking Smoker:  no  Health Maintenance: Pap:  08/2015 WNL per patient  History of abnormal Pap:  Yes atypical cells  MMG:  08/2015 WNL per patient  Colonoscopy: 2017 BMD:   Never TDaP:  Unsure  Gardasil: N/A   reports that she has never smoked. She has never used smokeless tobacco. She reports that she drinks alcohol. She reports that she does not use drugs.  Past Medical History:  Diagnosis Date  . Anemia   . Anxiety    panic attacks  . Arthritis   . Asthma   . Borderline diabetes   . Complication of anesthesia    difficulty breathing for a few a minutes after a procedure  . Difficult intubation    pt unsure   . Environmental and seasonal allergies   . Family history of adverse  reaction to anesthesia    " my son hard a hard time waking up "  . GERD (gastroesophageal reflux disease)   . Headache   . Helicobacter pylori gastritis   . Hernia, abdominal   . History of kidney stones   . Irregular heartbeat   . Pneumonia   . Recurrent sinus infections     Past Surgical History:  Procedure Laterality Date  . APPENDECTOMY    . BIOPSY ENDOMETRIAL    . COLONOSCOPY W/ BIOPSIES AND POLYPECTOMY    . ESOPHAGOGASTRODUODENOSCOPY (EGD) WITH PROPOFOL N/A 11/26/2016   Procedure: ESOPHAGOGASTRODUODENOSCOPY (EGD) WITH PROPOFOL;  Surgeon: Otis Brace, MD;  Location: [facility];  Service: Gastroenterology;  Laterality: N/A;  . ROTATOR CUFF REPAIR      Current Outpatient Prescriptions  Medication Sig Dispense Refill  . cetirizine (ZYRTEC) 10 MG tablet Take 10 mg by mouth daily.    . diphenhydrAMINE (BENADRYL) 25 MG tablet Take 25 mg by mouth every 6 (six) hours as needed.    . mometasone (ELOCON) 0.1 % cream Apply 1 application topically daily.    Marland Kitchen nystatin (MYCOSTATIN/NYSTOP) powder Apply topically 4 (four) times daily.    Marland Kitchen terconazole (TERAZOL 7) 0.4 % vaginal cream Place 1 applicator vaginally at bedtime.     No current facility-administered medications for this visit.  Family History  Problem Relation Age of Onset  . Diabetes Mother   . Heart attack Mother   . Colon cancer Father   . Cervical cancer Sister   . Diabetes Sister   . Heart attack Sister   . Epilepsy Brother     Review of Systems  Constitutional: Negative.   HENT: Negative.   Eyes: Negative.   Respiratory: Negative.   Cardiovascular: Negative.   Gastrointestinal: Negative.   Endocrine: Negative.   Genitourinary: Negative.        Vaginal itching Breast rash/itching  Musculoskeletal: Negative.   Skin: Negative.   Allergic/Immunologic: Negative.   Neurological: Negative.   Psychiatric/Behavioral: Negative.     Exam:   BP 122/68 (BP Location: Right Arm, Patient Position: Sitting,  Cuff Size: Normal)   Pulse 84   Resp 18   Ht 5\' 6"  (1.676 m)   Wt (!) 307 lb (139.3 kg)   LMP 11/22/2016   BMI 49.55 kg/m   Weight change: @WEIGHTCHANGE @ Height:   Height: 5\' 6"  (167.6 cm)  Ht Readings from Last 3 Encounters:  12/27/16 5\' 6"  (1.676 m)  12/10/16 5\' 6"  (1.676 m)  05/31/14 5\' 6"  (1.676 m)    General appearance: alert, cooperative and appears stated age Skin: Skin color, texture, turgor normal. Evidence of chronic irritation under bilateral breasts. Under her panus the skin is irritated, on the left it is weeping in spots, skin is darkened, evidence of chronic irritation. Bilateral groin with some graying of the skin, c/w chronic irritation.    Pelvic: External genitalia:  no lesions, above the clitoris is a small fissure and some whittenting              Urethra:  normal appearing urethra with no masses, tenderness or lesions              Bartholins and Skenes: normal                 Vagina: normal appearing vagina with normal color and discharge, no lesions              Cervix: No lesions  Chaperone was present for exam.  A:  Candida intertrigo  Suspect psoriasis   Focal genital pruritus  H/O recurrent UTI's, c/o flank pain  P:   Will send to a new dermatologist (not a good fit with the last one)  Yeast culture with sensitivities of skin (under panus) and vaginal   Vaginitis panel  Will continue nystatin powder and terazol to her skin (only thing that has helped)  Will call in valisone ointment for the peri clitoral irritation  Will get a copy of her prior records  Further plans after results are in  Urine for culture    35 minutes spent face to face, over 50% in counseling  [facility] and her last GYN Pap from 08/22/16: Negative with negative HPV Ultrasound from 1/18: Anteverted uterus, normal sized with an endometrial stripe of 1.04 cm, 2 small myomas (largest 1.2 cm) and a simple 2.2 cm left ovarian cyst.

## 2016-12-28 LAB — WET PREP BY MOLECULAR PROBE
Candida species: NOT DETECTED
Gardnerella vaginalis: NOT DETECTED
Trichomonas vaginosis: NOT DETECTED

## 2016-12-28 LAB — URINE CULTURE

## 2016-12-31 ENCOUNTER — Ambulatory Visit: Payer: 59 | Admitting: Registered"

## 2017-01-02 ENCOUNTER — Telehealth: Payer: Self-pay | Admitting: *Deleted

## 2017-01-02 NOTE — Telephone Encounter (Signed)
Spoke with patient and gave results -eh  

## 2017-01-02 NOTE — Telephone Encounter (Signed)
-----   Message from Salvadore Dom, MD sent at 01/01/2017  3:45 PM EDT ----- So far the fungal skin culture is negative, still in progress. The other culture is still pending

## 2017-01-02 NOTE — Telephone Encounter (Signed)
Left message to call regarding results -eh 

## 2017-01-02 NOTE — Telephone Encounter (Signed)
Patient is returning a call to Elaine. °

## 2017-01-10 ENCOUNTER — Encounter: Payer: Self-pay | Admitting: Family Medicine

## 2017-01-15 ENCOUNTER — Ambulatory Visit: Payer: 59 | Admitting: Dietician

## 2017-01-15 ENCOUNTER — Ambulatory Visit: Payer: 59 | Admitting: Registered"

## 2017-01-25 LAB — FUNGUS CULTURE W SMEAR

## 2017-01-28 LAB — CULT, FUNGUS, SKIN,HAIR,NAIL W/KOH

## 2017-01-29 ENCOUNTER — Telehealth: Payer: Self-pay | Admitting: Obstetrics and Gynecology

## 2017-01-29 NOTE — Telephone Encounter (Signed)
Patient was last seen 12/27/16 with Dr.Jertson and feels that she was supposed to have a follow up appointment. Patient cannot remember when she was told to return for follow up.

## 2017-01-29 NOTE — Telephone Encounter (Signed)
Routing to [place] for review and advise. I do not see the need for follow up noted in visit note from 12/27/2016.

## 2017-01-30 NOTE — Telephone Encounter (Signed)
Patient called again following up on below message.

## 2017-01-30 NOTE — Telephone Encounter (Signed)
I would recommend she return if she hasn't improved with the treatment given. She isn't due for routine GYN care until January. See if she has f/u with a new dermatologist.  I'm happy to see her any time if she feels she should be seen.

## 2017-01-31 NOTE — Telephone Encounter (Signed)
Her vaginitis pane was negative for yeast so I wouldn't start suppression yet.  Did she f/u with the dermatologist for her skin? If I can show recurrent yeast vaginitis, then I would put her on suppression

## 2017-01-31 NOTE — Telephone Encounter (Signed)
Spoke with patient. Advised of message as seen below from [place]. Patient verbalizes understanding. States that she has been continuing to use the betamethasone ointment and feels better with use. When she stops using the ointment feels symptoms returning. "I thought I was going to need to come back because after it cleared she was going to put me on a regimen of Diflucan." Advised I will review with Dr.Jertson and return call. Patient is agreeable.

## 2017-01-31 NOTE — Telephone Encounter (Signed)
Left message to call Tanya Houston at [phone]. 

## 2017-02-01 NOTE — Telephone Encounter (Signed)
Spoke with patient, advised as seen below per Dr. Talbert Nan. Patient states she is scheduled to see dermatology on 6/22. Patient states whe her and spouse were intimate last she felt like inside of vagina was burning, felt like a "cutting" feeling. Patient states she has not experienced this before, still has some itching. Does not use any type of lubricant or oil. Recommended OV for further evaluation, patient request first morning or last appt of afternoon. Patient scheduled for 02/07/17 at 8:15am with Dr. Gwendolyn Lima. Patient is agreeable to date and time.  Routing to provider for final review. Patient is agreeable to disposition. Will close encounter.

## 2017-02-01 NOTE — Telephone Encounter (Signed)
Patient returning your call.

## 2017-02-07 ENCOUNTER — Encounter: Payer: Self-pay | Admitting: Obstetrics and Gynecology

## 2017-02-07 ENCOUNTER — Ambulatory Visit (INDEPENDENT_AMBULATORY_CARE_PROVIDER_SITE_OTHER): Payer: 59 | Admitting: Obstetrics and Gynecology

## 2017-02-07 VITALS — BP 112/74 | HR 84 | Resp 18 | Wt 314.0 lb

## 2017-02-07 DIAGNOSIS — Z862 Personal history of diseases of the blood and blood-forming organs and certain disorders involving the immune mechanism: Secondary | ICD-10-CM

## 2017-02-07 DIAGNOSIS — Z8639 Personal history of other endocrine, nutritional and metabolic disease: Secondary | ICD-10-CM

## 2017-02-07 DIAGNOSIS — N912 Amenorrhea, unspecified: Secondary | ICD-10-CM

## 2017-02-07 DIAGNOSIS — N760 Acute vaginitis: Secondary | ICD-10-CM | POA: Diagnosis not present

## 2017-02-07 DIAGNOSIS — R609 Edema, unspecified: Secondary | ICD-10-CM

## 2017-02-07 MED ORDER — BETAMETHASONE VALERATE 0.1 % EX OINT: TOPICAL_OINTMENT | CUTANEOUS | 0 refills | Status: DC

## 2017-02-07 MED ORDER — MEDROXYPROGESTERONE ACETATE 5 MG PO TABS: 5.0000 mg | ORAL_TABLET | Freq: Every day | ORAL | 0 refills | Status: DC

## 2017-02-07 NOTE — Progress Notes (Signed)
GYNECOLOGY  VISIT   HPI: 51 y.o.   Married  [place] American  female   [phone] with Patient's last menstrual period was 11/22/2016.   Here c/o vaginal itching and burning with intercourse. She had a negative vaginal yeast culture last month. Steroid ointment has helped. She has run out of the ointment. She used it 2-3 x last week.  She has had trouble getting an appointment at the new dermatology office.  Her candida intertrigo has gotten a little better. She has environmental allergies and gets hives from walking out side.     She has diabetes, her fasting FS are ranging from 130-150. Primary gave her metformin, she hasn't taken it because of the fear of the side effects. She has been loosing weight, got down to 309, now with fluid back up to 314.  Currently she having mild vulvar itching, but increase in vaginal itching. No abnormal d/c Tried to have sex for the first time in 8 months. Finally felt she was feeling well enough. Burning and dryness with intercourse, felt like she was tearing. Unable to continue. She was having regular cycles from last August until 4/18. Menses were monthly x 6-7 days. Saturating a pad every couple of hours. No bleeding since April. No bleeding from 3-7/17.  In January,  Dr Leo Grosser tried to do an endometrial biopsy, couldn't tolerate it in the office.  No hot flashes or nightsweats   GYNECOLOGIC HISTORY: Patient's last menstrual period was 11/22/2016. Contraception:none Menopausal hormone therapy: none        OB History    Gravida Para Term Preterm AB Living   3 2 2   1 2    SAB TAB Ectopic Multiple Live Births           2         Patient Active Problem List   Diagnosis Date Noted  . Uncontrolled type 2 diabetes mellitus with hyperglycemia, without long-term current use of insulin ([place]) 12/13/2016  . Diabetes mellitus type II, uncontrolled ([place]) 12/10/2016  . Morbid obesity with BMI of 50.0-59.9, adult ([place]) 12/10/2016  . Allergic rhinitis  12/10/2016    Past Medical History:  Diagnosis Date  . Anemia   . Anxiety    panic attacks  . Arthritis   . Asthma   . Borderline diabetes   . Complication of anesthesia    difficulty breathing for a few a minutes after a procedure  . Difficult intubation    pt unsure   . Environmental and seasonal allergies   . Family history of adverse reaction to anesthesia    " my son hard a hard time waking up "  . GERD (gastroesophageal reflux disease)   . Headache   . Helicobacter pylori gastritis   . Hernia, abdominal   . History of kidney stones   . Irregular heartbeat   . Pneumonia   . Recurrent sinus infections     Past Surgical History:  Procedure Laterality Date  . APPENDECTOMY    . BIOPSY ENDOMETRIAL    . COLONOSCOPY W/ BIOPSIES AND POLYPECTOMY    . ESOPHAGOGASTRODUODENOSCOPY (EGD) WITH PROPOFOL N/A 11/26/2016   Procedure: ESOPHAGOGASTRODUODENOSCOPY (EGD) WITH PROPOFOL;  Surgeon: Otis Brace, MD;  Location: [facility];  Service: Gastroenterology;  Laterality: N/A;  . ROTATOR CUFF REPAIR      Current Outpatient Prescriptions  Medication Sig Dispense Refill  . betamethasone valerate ointment (VALISONE) 0.1 % Apply a pea sized amount BID x 1-2 weeks as needed. Not for long term  daily use. 30 g 0  . cetirizine (ZYRTEC) 10 MG tablet Take 10 mg by mouth daily.    . mometasone (ELOCON) 0.1 % cream Apply 1 application topically daily.    Marland Kitchen nystatin (MYCOSTATIN/NYSTOP) powder Apply topically 3 (three) times daily. 56.7 g 2  . terconazole (TERAZOL 7) 0.4 % vaginal cream Place 1 applicator vaginally at bedtime. 45 g 1   No current facility-administered medications for this visit.      ALLERGIES: Adhesive [tape]; Fish allergy; Latex; Peanut oil; and Sulfa antibiotics  Family History  Problem Relation Age of Onset  . Diabetes Mother   . Heart attack Mother   . Colon cancer Father   . Cervical cancer Sister   . Diabetes Sister   . Heart attack Sister   . Epilepsy  Brother     Social History   Social History  . Marital status: Married    Spouse name: N/A  . Number of children: N/A  . Years of education: N/A   Occupational History  . Not on file.   Social History Main Topics  . Smoking status: Never Smoker  . Smokeless tobacco: Never Used  . Alcohol use Yes     Comment: occ  . Drug use: No  . Sexual activity: Yes    Partners: Male    Birth control/ protection: None   Other Topics Concern  . Not on file   Social History Narrative  . No narrative on file    Review of Systems  Constitutional: Negative.   HENT: Negative.   Eyes: Negative.   Respiratory: Negative.   Cardiovascular: Negative.   Gastrointestinal: Negative.   Genitourinary:       Vaginal itching and burning with intercourse   Musculoskeletal: Negative.   Skin: Negative.   Neurological: Negative.   Endo/Heme/Allergies: Negative.   Psychiatric/Behavioral: Negative.     PHYSICAL EXAMINATION:    BP 112/74 (BP Location: Right Wrist, Patient Position: Sitting, Cuff Size: Normal)   Pulse 84   Resp 18   Wt (!) 314 lb (142.4 kg)   LMP 11/22/2016   BMI 50.68 kg/m     General appearance: alert, cooperative and appears stated age Skin: much improved, no active signs of candida intertrigo Pelvic: External genitalia:  no lesions              Urethra:  normal appearing urethra with no masses, tenderness or lesions              Bartholins and Skenes: normal                 Vagina: normal appearing vagina with mild atrophy, mild erythema, no discharge2              Cervix: no lesions               Ext: 2-3+ pitting edema Chaperone was present for exam.  ASSESSMENT Vaginitis Vaginal dryness Amenorrhea, no cycle since 11/22/16, suspect she is preimenopausal Diabetes H/O heavy cycles and anemia (hgb 9.9 g/dl 9 months ago) 2-3+ pitting edema     PLAN Vaginitis panel Steroid ointment, not for daily use, can use it 2 x a week Vaseline as needed Suspect some of her  vaginal discomfort is from atrophy Discussed vaginal lubrication CBC,TSH, Prolactin, FSH, HgbA1C Provera 5 mg x 5 days (aware she shouldn't take if any chance of pregnancy) Encouraged her to start the metformin and speak to her primary about her concerns with the metformin Will review  office notes from prior GYN Also encouraged her to f/u with her primary for pitting edema in her lower extremities   An After Visit Summary was printed and given to the patient.  25 minutes face to face time of which over 50% was spent in counseling.   CC: Dr Betty Martinique

## 2017-02-08 LAB — HEMOGLOBIN A1C
Est. average glucose Bld gHb Est-mCnc: 146 mg/dL
HEMOGLOBIN A1C: 6.7 % — AB (ref 4.8–5.6)

## 2017-02-08 LAB — FOLLICLE STIMULATING HORMONE: FSH: 7.1 m[IU]/mL

## 2017-02-08 LAB — CBC
HEMATOCRIT: 33.3 % — AB (ref 34.0–46.6)
Hemoglobin: 9.9 g/dL — ABNORMAL LOW (ref 11.1–15.9)
MCH: 23 pg — ABNORMAL LOW (ref 26.6–33.0)
MCHC: 29.7 g/dL — ABNORMAL LOW (ref 31.5–35.7)
MCV: 77 fL — ABNORMAL LOW (ref 79–97)
Platelets: 242 10*3/uL (ref 150–379)
RBC: 4.3 x10E6/uL (ref 3.77–5.28)
RDW: 16.8 % — AB (ref 12.3–15.4)
WBC: 6.3 10*3/uL (ref 3.4–10.8)

## 2017-02-08 LAB — TSH: TSH: 2.62 u[IU]/mL (ref 0.450–4.500)

## 2017-02-08 LAB — PROLACTIN: PROLACTIN: 26.2 ng/mL — AB (ref 4.8–23.3)

## 2017-02-08 NOTE — Addendum Note (Signed)
Addended by: Dorothy Spark on: 02/08/2017 11:21 AM   Modules accepted: Orders

## 2017-02-10 LAB — VAGINITIS/VAGINOSIS, DNA PROBE
CANDIDA SPECIES: NEGATIVE
Gardnerella vaginalis: NEGATIVE
Trichomonas vaginosis: NEGATIVE

## 2017-02-10 LAB — FERRITIN: FERRITIN: 33 ng/mL (ref 15–150)

## 2017-02-10 LAB — SPECIMEN STATUS REPORT

## 2017-02-11 ENCOUNTER — Telehealth: Payer: Self-pay

## 2017-02-11 DIAGNOSIS — R7989 Other specified abnormal findings of blood chemistry: Secondary | ICD-10-CM

## 2017-02-11 DIAGNOSIS — E229 Hyperfunction of pituitary gland, unspecified: Secondary | ICD-10-CM

## 2017-02-11 DIAGNOSIS — B372 Candidiasis of skin and nail: Secondary | ICD-10-CM

## 2017-02-11 DIAGNOSIS — N912 Amenorrhea, unspecified: Secondary | ICD-10-CM

## 2017-02-11 NOTE — Telephone Encounter (Signed)
Left message to call [place] at [phone].  Notes recorded by Salvadore Dom, MD on 02/10/2017 at 2:57 PM EDT Please advise the patient of normal results. Awaiting ferritin result.  Please see how she is feeling ------  Notes recorded by Salvadore Dom, MD on 02/08/2017 at 11:21 AM EDT Please inform the patient that she is still anemic, I'm adding a ferritin to determine if she has an iron deficiency.  Her Prolactin level is slightly elevated (this is a hormone secreted by the brain that can cause amenorrhea or oligomenorrhea). Please have her come back for a repeat prolactin level (she shouldn't have sex or strenuous exercise prior to the repeat blood work).  Her [place] is in a premenopausal range, I suspect she is perimenopausal Her HgbA1C has improved from 2 months ago, but is still elevated. She should discuss this further with her primary.  Her TSH is normal.  Her vaginitis panel is still pending. Please check with the lab, they are supposed to have them back by the next morning.

## 2017-02-12 ENCOUNTER — Telehealth: Payer: Self-pay

## 2017-02-12 NOTE — Telephone Encounter (Signed)
lmtcb

## 2017-02-12 NOTE — Telephone Encounter (Signed)
Tanya Dom, MD  Falicia Lizotte, Harley Hallmark, RN        Verline Lema,  When you have time, can you please try and get an appointment with [facility] for this patient. She has had issues with difficult to treat candida intertrigo (at the moment improved, but gets very bad).    Left message to call [place] at [phone].

## 2017-02-12 NOTE — Telephone Encounter (Signed)
Patient returning call.

## 2017-02-12 NOTE — Telephone Encounter (Signed)
Patient is returning a call to [place]. Patient said to please leave details on her voicemail. Patient is hard to get in contact with by phone during business hours.

## 2017-02-12 NOTE — Telephone Encounter (Addendum)
Spoke with patient, advised of results and recommendations as seen below per Dr. Talbert Nan. Patient accepts referral to [facility], order placed. Advised patient our referral department will f/u with scheduling. Patient scheduled for repeat prolactin level on 02/19/17 at 8:30am, patient declined earlier appointment d/t work schedule, request first lab appointment of day. Patient states she is still experiencing vaginal itching. Patient would like Dr. Talbert Nan to know she took metformin and checked blood sugar following morning, reports 125. Patient verbalizes understanding and is agreeable.  Routing to provider for final review. Patient is agreeable to disposition. Will close encounter.  Cc: Theresia Lo

## 2017-02-13 NOTE — Telephone Encounter (Signed)
Given the negative vaginitis panel, I don't think she has a current yeast infection. The steroid ointment should help her feel better. If she isn't feeling better by next week she should call. I think it's great that she started the metformin.

## 2017-02-14 NOTE — Telephone Encounter (Signed)
Patient notified per note & elaine hanner,cma

## 2017-02-18 NOTE — Telephone Encounter (Signed)
Left detailed message with patient (ok per DPR) with recommendation from Dr. Talbert Nan

## 2017-02-19 ENCOUNTER — Other Ambulatory Visit: Payer: Self-pay

## 2017-02-19 ENCOUNTER — Telehealth: Payer: Self-pay | Admitting: Obstetrics and Gynecology

## 2017-02-19 NOTE — Telephone Encounter (Signed)
Left message on voicemail to reschedule missed lab appointment. °

## 2017-02-27 LAB — FUNGUS CULTURE W SMEAR

## 2017-02-28 ENCOUNTER — Telehealth: Payer: Self-pay | Admitting: Obstetrics and Gynecology

## 2017-02-28 NOTE — Telephone Encounter (Signed)
Called to patient's office number [phone] per request there was no answer and patient requests no voicemail be left at the number. Left message on mobile number to call Tanya Houston at [phone].

## 2017-02-28 NOTE — Telephone Encounter (Signed)
Spoke with patient. Patient states that she was prescribed Provera due to irregular menses. Reports she started her menses on 02/23/2017 without taking Provera. Bleeding is like a normal menses for the patient. Denies heavy bleeding. Is wearing a pad that she changes every 2 hours or more. Advised patient that she will need to monitor for prolonged or heavy bleeding and notify the office if this occurs. Will need to keep a bleeding calendar and if she does not have a menses for 3 months in a row will need to contact the office. Rescheduled patient missed lab appointment for prolactin recheck to 03/01/2017 at 2 pm. Patient is agreeable to date and time. Advised to abstain from intercourse and no to perform vigorous activity before lab appointment.   Routing to provider for final review. Patient agreeable to disposition. Will close encounter.

## 2017-02-28 NOTE — Telephone Encounter (Signed)
Patient called to let the nurse know she did not have to take the medication to make her menstrual cycle start. It started on 02/23/17 and is still ongoing as usual.

## 2017-03-01 ENCOUNTER — Other Ambulatory Visit (INDEPENDENT_AMBULATORY_CARE_PROVIDER_SITE_OTHER): Payer: 59

## 2017-03-01 DIAGNOSIS — R7989 Other specified abnormal findings of blood chemistry: Secondary | ICD-10-CM

## 2017-03-01 DIAGNOSIS — E229 Hyperfunction of pituitary gland, unspecified: Secondary | ICD-10-CM

## 2017-03-01 DIAGNOSIS — N912 Amenorrhea, unspecified: Secondary | ICD-10-CM

## 2017-03-02 LAB — PROLACTIN: Prolactin: 22.6 ng/mL (ref 4.8–23.3)

## 2017-03-11 ENCOUNTER — Encounter: Payer: Self-pay | Admitting: Obstetrics and Gynecology

## 2017-04-07 NOTE — Progress Notes (Deleted)
HPI:   [name] is a 51 y.o. female, who is here today for 4 months follow up.   She was last seen on 12/10/16.  Since her last OV she has followed with her gyn, Dr Talbert Nan.  Diabetes Mellitus II:   Dx in 11/2016. Last eye exam 07/2016.  Currently on non pharmacologic treatment.  Checking BS's : ***  *** denies abdominal pain, nausea, vomiting, polydipsia, polyuria, or polyphagia. ***numbness, tingling, or burning.    Lab Results  Component Value Date   CREATININE 0.5 04/19/2016   BUN 7 04/19/2016   NA 140 04/19/2016   K 4.4 04/19/2016   CL 104 05/31/2014   CO2 21 05/31/2014    Lab Results  Component Value Date   HGBA1C 6.7 (H) 02/07/2017   Lab Results  Component Value Date   MICROALBUR 1.8 12/10/2016    Obesity:  Since her last OV she has *** Exercise: ***   Wt Readings from Last 3 Encounters:  02/07/17 (!) 314 lb (142.4 kg)  12/27/16 (!) 307 lb (139.3 kg)  12/10/16 (!) 310 lb 2 oz (140.7 kg)    Review of Systems  [review of 2 to 9 systems] ***  Current Outpatient Prescriptions on File Prior to Visit  Medication Sig Dispense Refill  . betamethasone valerate ointment (VALISONE) 0.1 % Apply a pea sized amount BID x 1-2 weeks as needed. Not for long term daily use. 30 g 0  . cetirizine (ZYRTEC) 10 MG tablet Take 10 mg by mouth daily.    . medroxyPROGESTERone (PROVERA) 5 MG tablet Take 1 tablet (5 mg total) by mouth daily. 5 tablet 0  . mometasone (ELOCON) 0.1 % cream Apply 1 application topically daily.    Marland Kitchen nystatin (MYCOSTATIN/NYSTOP) powder Apply topically 3 (three) times daily. 56.7 g 2  . terconazole (TERAZOL 7) 0.4 % vaginal cream Place 1 applicator vaginally at bedtime. 45 g 1   No current facility-administered medications on file prior to visit.      Past Medical History:  Diagnosis Date  . Anemia   . Anxiety    panic attacks  . Arthritis   . Asthma   . Borderline diabetes   . Complication of anesthesia    difficulty breathing for a few a minutes after a procedure  . Difficult intubation    pt unsure   . Environmental and seasonal allergies   . Family history of adverse reaction to anesthesia    " my son hard a hard time waking up "  . GERD (gastroesophageal reflux disease)   . Headache   . Helicobacter pylori gastritis   . Hernia, abdominal   . History of kidney stones   . Irregular heartbeat   . Pneumonia   . Recurrent sinus infections    Allergies  Allergen Reactions  . Adhesive [Tape] Hives    And Band Aids  . Fish Allergy Shortness Of Breath and Itching  . Latex Hives  . Peanut Oil   . Sulfa Antibiotics Hives    Social History   Social History  . Marital status: Married    Spouse name: N/A  . Number of children: N/A  . Years of education: N/A   Social History Main Topics  . Smoking status: Never Smoker  . Smokeless tobacco: Never Used  . Alcohol use Yes     Comment: occ  . Drug use: No  . Sexual activity: Yes    Partners: Male    Birth control/  protection: None   Other Topics Concern  . Not on file   Social History Narrative  . No narrative on file    There were no vitals filed for this visit. There is no height or weight on file to calculate BMI.      Physical Exam  [12+ exam elements] ***  ASSESSMENT AND PLAN:   [name] was seen today for *** months follow-up.   There are no diagnoses linked to this encounter.         -[name] was advised to return sooner than planned today if new concerns arise.       Betty G. Martinique, MD  [facility]. [place] office.

## 2017-04-08 ENCOUNTER — Ambulatory Visit: Payer: 59 | Admitting: Family Medicine

## 2017-04-14 NOTE — Progress Notes (Signed)
HPI:   [name] is a 51 y.o. female, who is here today for 4 months follow up.   She was last seen on 12/10/16.  Since her last OV she has followed with her gyn, Dr Talbert Nan.  Diabetes Mellitus II:   Dx in 11/2016. Last eye exam 07/2016.  Currently on non pharmacologic treatment, Metformin caused sharp chest pain. Denies exertional chest pain, diaphoresis,or dyspnea. She denies GI symptoms.   Checking BS's : 101-150, post prandial 130 or less.  She denies abdominal pain, nausea, vomiting, polydipsia, polyuria, or polyphagia. No numbness, tingling, or burning.    Lab Results  Component Value Date   CREATININE 0.5 04/19/2016   BUN 7 04/19/2016   NA 140 04/19/2016   K 4.4 04/19/2016   CL 104 05/31/2014   CO2 21 05/31/2014    Lab Results  Component Value Date   HGBA1C 6.7 (H) 02/07/2017   Lab Results  Component Value Date   MICROALBUR 1.8 12/10/2016    Obesity:  Since her last OV she has stopped ice cream, decreased sodas, increased water. Exercise: Not exercising. Planning on starting the treadmill.   Wt Readings from Last 3 Encounters:  04/15/17 (!) 307 lb (139.3 kg)  02/07/17 (!) 314 lb (142.4 kg)  12/27/16 (!) 307 lb (139.3 kg)   Skin pruritus, she missed appt with dermatology. She has rash under breast and groin areas. Hx of eczema, applying valisoline cream.  Currently she is completing Abx treatment die to recent dental surgery, Amoxicillin. Abx seemed to exacerbated pruritus under breast and groin,"so badly." She is also using Nystatin powder. She denies fever,chills, or ulcers.   Review of Systems  Constitutional: Negative for activity change, appetite change, fatigue, fever and unexpected weight change.  HENT: Negative for mouth sores, nosebleeds and trouble swallowing.   Eyes: Negative for redness and visual disturbance.  Respiratory: Negative for cough, shortness of breath and wheezing.   Cardiovascular: Negative for chest  pain, palpitations and leg swelling.  Gastrointestinal: Negative for abdominal pain, nausea and vomiting.       Negative for changes in bowel habits.  Genitourinary: Negative for decreased urine volume, dysuria and hematuria.  Musculoskeletal: Negative for gait problem and myalgias.  Skin: Positive for rash. Negative for wound.  Neurological: Negative for syncope, weakness and headaches.  Psychiatric/Behavioral: Negative for confusion. The patient is nervous/anxious.       Current Outpatient Prescriptions on File Prior to Visit  Medication Sig Dispense Refill  . betamethasone valerate ointment (VALISONE) 0.1 % Apply a pea sized amount BID x 1-2 weeks as needed. Not for long term daily use. 30 g 0  . cetirizine (ZYRTEC) 10 MG tablet Take 10 mg by mouth daily.    Marland Kitchen terconazole (TERAZOL 7) 0.4 % vaginal cream Place 1 applicator vaginally at bedtime. 45 g 1   No current facility-administered medications on file prior to visit.      Past Medical History:  Diagnosis Date  . Anemia   . Anxiety    panic attacks  . Arthritis   . Asthma   . Borderline diabetes   . Complication of anesthesia    difficulty breathing for a few a minutes after a procedure  . Difficult intubation    pt unsure   . Environmental and seasonal allergies   . Family history of adverse reaction to anesthesia    " my son hard a hard time waking up "  . GERD (gastroesophageal reflux disease)   .  Headache   . Helicobacter pylori gastritis   . Hernia, abdominal   . History of kidney stones   . Irregular heartbeat   . Pneumonia   . Recurrent sinus infections    Allergies  Allergen Reactions  . Adhesive [Tape] Hives    And Band Aids  . Fish Allergy Shortness Of Breath and Itching  . Latex Hives  . Peanut Oil   . Sulfa Antibiotics Hives    Social History   Social History  . Marital status: Married    Spouse name: N/A  . Number of children: N/A  . Years of education: N/A   Social History Main Topics   . Smoking status: Never Smoker  . Smokeless tobacco: Never Used  . Alcohol use Yes     Comment: occ  . Drug use: No  . Sexual activity: Yes    Partners: Male    Birth control/ protection: None   Other Topics Concern  . None   Social History Narrative  . None    Vitals:   04/15/17 0720  BP: 128/80  Pulse: 93  Resp: 12  SpO2: 97%   Body mass index is 49.55 kg/m.   Physical Exam  Nursing note and vitals reviewed. Constitutional: She is oriented to person, place, and time. She appears well-developed. No distress.  HENT:  Head: Normocephalic and atraumatic.  Mouth/Throat: Oropharynx is clear and moist and mucous membranes are normal.  Eyes: Pupils are equal, round, and reactive to light. Conjunctivae are normal.  Cardiovascular: Normal rate and regular rhythm.   No murmur heard. Pulses:      Dorsalis pedis pulses are 2+ on the right side, and 2+ on the left side.  Respiratory: Effort normal and breath sounds normal. No respiratory distress.  GI: Soft. She exhibits no mass. There is no hepatomegaly. There is no tenderness.  Musculoskeletal: She exhibits edema (Trace pitting edema LE, bilateral). She exhibits no tenderness.  Lymphadenopathy:    She has no cervical adenopathy.  Neurological: She is alert and oriented to person, place, and time. She has normal strength. Coordination and gait normal.  Skin: Skin is warm. Rash noted. No erythema.  Hyperpigmentation changes under breast bilateral and inguinal areas, bilateral. Mild erythema on inguinal area. Neck left-sided scaly area with post inflammatory pigmentation changes.  I do not appreciate indurationor tenderness.  Psychiatric: Her mood appears anxious.  Well groomed, good eye contact.   Diabetic Foot Exam - Simple   Simple Foot Form Diabetic Foot exam was performed with the following findings:  Yes 04/15/2017  7:41 AM  Visual Inspection No deformities, no ulcerations, no other skin breakdown bilaterally:   Yes Sensation Testing Intact to touch and monofilament testing bilaterally:  Yes Pulse Check Posterior Tibialis and Dorsalis pulse intact bilaterally:  Yes Comments      ASSESSMENT AND PLAN:   [name] was seen today for 4 months follow-up.   Diagnoses and all orders for this visit:  Lab Results  Component Value Date   HGBA1C 6.7 (H) 02/07/2017   Lab Results  Component Value Date   CREATININE 0.5 04/19/2016   BUN 7 04/19/2016   NA 140 04/19/2016   K 4.4 04/19/2016   CL 104 05/31/2014   CO2 21 05/31/2014    Uncontrolled type 2 diabetes mellitus with hyperglycemia, without long-term current use of insulin ([place])  HgA1C pending. Will consider Victoza depending of HgA1C. Regular exercise and healthy diet with avoidance of added sugar food intake is an important  part of treatment and recommended. Annual eye exam, periodic dental and foot care recommended. F/U in 5-6 months  -     Basic metabolic panel; Future -     Hemoglobin A1c; Future  Eczema, unspecified type  Continue topical steroid (Valisone), small amount at the time. Side effects discussed. Continue following with dermatologists.   Intertrigo  Educated about Dx and treatment options as well as prognosis. Diflucan 150 mg weekly x 3 doses recommended. Topical Nystatin/triamcinolone , small amount on affected areas as needed bid. Antibacterial soap and keeps areas dry.   -     nystatin-triamcinolone (MYCOLOG II) cream; Apply 1 application topically 2 (two) times daily as needed. -     fluconazole (DIFLUCAN) 150 MG tablet; Take 1 tablet (150 mg total) by mouth once a week.   Morbid obesity with BMI of 50.0-59.9, adult ([place])  She has lost about 3 Lb since her last OV. We discussed benefits of wt loss as well as adverse effects of obesity. Consistency with healthy diet and physical activity recommended. Daily brisk walking for 15-30 min as tolerated. F/U in 5-6 months.    -[name] was advised to return sooner than planned today if new concerns arise.       Tanya Houston G. Martinique, MD  [facility]. [place] office.

## 2017-04-15 ENCOUNTER — Ambulatory Visit (INDEPENDENT_AMBULATORY_CARE_PROVIDER_SITE_OTHER): Payer: 59 | Admitting: Family Medicine

## 2017-04-15 ENCOUNTER — Encounter: Payer: Self-pay | Admitting: Family Medicine

## 2017-04-15 VITALS — BP 128/80 | HR 93 | Resp 12 | Ht 66.0 in | Wt 307.0 lb

## 2017-04-15 DIAGNOSIS — L309 Dermatitis, unspecified: Secondary | ICD-10-CM

## 2017-04-15 DIAGNOSIS — Z6841 Body Mass Index (BMI) 40.0 and over, adult: Secondary | ICD-10-CM | POA: Diagnosis not present

## 2017-04-15 DIAGNOSIS — L304 Erythema intertrigo: Secondary | ICD-10-CM

## 2017-04-15 DIAGNOSIS — E1165 Type 2 diabetes mellitus with hyperglycemia: Secondary | ICD-10-CM | POA: Diagnosis not present

## 2017-04-15 MED ORDER — FLUCONAZOLE 150 MG PO TABS: 150.0000 mg | ORAL_TABLET | ORAL | 0 refills | Status: DC

## 2017-04-15 MED ORDER — NYSTATIN-TRIAMCINOLONE 100000-0.1 UNIT/GM-% EX CREA: 1.0000 "application " | TOPICAL_CREAM | Freq: Two times a day (BID) | CUTANEOUS | 0 refills | Status: DC | PRN

## 2017-04-15 NOTE — Patient Instructions (Signed)
A few things to remember from today's visit:   Intertrigo  Eczema, unspecified type  Uncontrolled type 2 diabetes mellitus with hyperglycemia, without long-term current use of insulin ([place]) - Plan: Basic metabolic panel, Hemoglobin A1c  Future labs.  Will consider Victoza.  HgA1C goal < 7.0. Avoid sugar added food:regular soft drinks, energy drinks, and sports drinks. candy. cakes. cookies. pies and cobblers. sweet rolls, pastries, and donuts. fruit drinks, such as fruitades and fruit punch. dairy desserts, such as ice cream  Mediterranean diet has showed benefits for sugar control.  How much and what type of carbohydrate foods are important for managing diabetes. The balance between how much insulin is in your body and the carbohydrate you eat makes a difference in your blood glucose levels.  Fasting blood sugar ideally 130 or less, 2 hours after meals less than 180.   Regular exercise also will help with controlling disease, daily brisk walking as tolerated for 15-30 min definitively will help.   Avoid skipping meals, blood sugar might drop and cause serious problems. Remember checking feet periodically, good dental hygiene, and annual eye exam.      Please be sure medication list is accurate. If a new problem present, please set up appointment sooner than planned today.

## 2017-04-16 ENCOUNTER — Telehealth: Payer: Self-pay | Admitting: Family Medicine

## 2017-04-16 ENCOUNTER — Other Ambulatory Visit: Payer: Self-pay

## 2017-04-16 MED ORDER — FLUCONAZOLE 150 MG PO TABS: 150.0000 mg | ORAL_TABLET | ORAL | 0 refills | Status: DC

## 2017-04-16 MED ORDER — NYSTATIN-TRIAMCINOLONE 100000-0.1 UNIT/GM-% EX CREA: 1.0000 "application " | TOPICAL_CREAM | Freq: Two times a day (BID) | CUTANEOUS | 0 refills | Status: DC | PRN

## 2017-04-16 MED ORDER — TRIAMCINOLONE ACETONIDE 0.1 % EX CREA: 1.0000 "application " | TOPICAL_CREAM | Freq: Two times a day (BID) | CUTANEOUS | 0 refills | Status: DC

## 2017-04-16 MED ORDER — NYSTATIN 100000 UNIT/GM EX CREA: 1.0000 "application " | TOPICAL_CREAM | Freq: Two times a day (BID) | CUTANEOUS | 0 refills | Status: DC

## 2017-04-16 MED ORDER — FLUCONAZOLE 150 MG PO TABS: 150.0000 mg | ORAL_TABLET | ORAL | 0 refills | Status: AC

## 2017-04-16 NOTE — Telephone Encounter (Signed)
Spoke to patient and advised that [facility] and [facility] on [facility].  I apologized that we didn't specify with her which pharmacy before sending, but did advise [facility] and [facility] to [facility].  Patient acknowledged understanding .

## 2017-04-16 NOTE — Telephone Encounter (Signed)
° °  Pt said she contacted [facility] and they do not have the below meds    fluconazole (DIFLUCAN) 150 MG tablet  nystatin-triamcinolone (MYCOLOG II) cream     Phamacy: [facility] [address]

## 2017-04-16 NOTE — Telephone Encounter (Signed)
Pt medication was called in to the wrong pharmacy.  Pharm:  [facility]  Pt very nasty b/c she state that her medication is always called in to the wrong pharmacy.

## 2017-04-16 NOTE — Telephone Encounter (Signed)
Rxs sent in again. 

## 2017-04-19 ENCOUNTER — Encounter: Payer: Self-pay | Admitting: Family Medicine

## 2017-05-01 ENCOUNTER — Ambulatory Visit (INDEPENDENT_AMBULATORY_CARE_PROVIDER_SITE_OTHER): Payer: 59 | Admitting: Obstetrics and Gynecology

## 2017-05-01 ENCOUNTER — Ambulatory Visit: Payer: 59 | Admitting: Obstetrics and Gynecology

## 2017-05-01 ENCOUNTER — Encounter: Payer: Self-pay | Admitting: Obstetrics and Gynecology

## 2017-05-01 VITALS — BP 160/80 | HR 84 | Resp 20 | Wt 309.0 lb

## 2017-05-01 DIAGNOSIS — L309 Dermatitis, unspecified: Secondary | ICD-10-CM | POA: Diagnosis not present

## 2017-05-01 DIAGNOSIS — R350 Frequency of micturition: Secondary | ICD-10-CM

## 2017-05-01 DIAGNOSIS — N762 Acute vulvitis: Secondary | ICD-10-CM | POA: Diagnosis not present

## 2017-05-01 DIAGNOSIS — Z8639 Personal history of other endocrine, nutritional and metabolic disease: Secondary | ICD-10-CM

## 2017-05-01 DIAGNOSIS — B372 Candidiasis of skin and nail: Secondary | ICD-10-CM

## 2017-05-01 LAB — POCT URINALYSIS DIPSTICK
Bilirubin, UA: NEGATIVE
Glucose, UA: NEGATIVE
Ketones, UA: NEGATIVE
Nitrite, UA: NEGATIVE
PROTEIN UA: NEGATIVE
UROBILINOGEN UA: NEGATIVE U/dL — AB
pH, UA: 6 (ref 5.0–8.0)

## 2017-05-01 MED ORDER — HYDROCORTISONE 2.5 % RE CREA: TOPICAL_CREAM | RECTAL | 0 refills | Status: DC

## 2017-05-01 MED ORDER — KETOCONAZOLE 2 % EX CREA: 1.0000 "application " | TOPICAL_CREAM | Freq: Every day | CUTANEOUS | 0 refills | Status: DC

## 2017-05-01 MED ORDER — HYDROXYZINE HCL 25 MG PO TABS: 25.0000 mg | ORAL_TABLET | Freq: Three times a day (TID) | ORAL | 0 refills | Status: DC | PRN

## 2017-05-01 NOTE — Progress Notes (Signed)
GYNECOLOGY  VISIT   HPI: 51 y.o.   Married  [place] American  female   [phone] with Patient's last menstrual period was 11/21/2016.   here c/o vaginal itching, urinary frequency    She c/o a one week h/o, uncomfortable itching in the groin, vulva, perianal area. Her symptoms have been severe in the last 1-2 days. Trying not to scratch. Burning is terrible. She has been using a generic of neosporin topically because she is worried that the scratching will cause a bacterial infection. She also started taking some left over amoxicillin one x a day.  She see's Dr Martinique for her diabetes, she started metformin since her last visit here. She has an appointment to go back to see Dr Martinique later this month. She checks her FS 2 x day. Fasting is usually 107-123, 2 hour PP goes up to 130-150. She is so thirsty on the medication.  She c/o a one month h/o urinary frequency and urgency. External dysuria only. She has an appointment to see dermatology later this month for her multiple skin issues.   GYNECOLOGIC HISTORY: Patient's last menstrual period was 11/21/2016. Contraception:none  Menopausal hormone therapy: none         OB History    Gravida Para Term Preterm AB Living   3 2 2   1 2    SAB TAB Ectopic Multiple Live Births           2         Patient Active Problem List   Diagnosis Date Noted  . Eczema 04/15/2017  . Uncontrolled type 2 diabetes mellitus with hyperglycemia, without long-term current use of insulin ([place]) 12/13/2016  . Diabetes mellitus type II, uncontrolled ([place]) 12/10/2016  . Morbid obesity with BMI of 50.0-59.9, adult ([place]) 12/10/2016  . Allergic rhinitis 12/10/2016    Past Medical History:  Diagnosis Date  . Anemia   . Anxiety    panic attacks  . Arthritis   . Asthma   . Borderline diabetes   . Complication of anesthesia    difficulty breathing for a few a minutes after a procedure  . Difficult intubation    pt unsure   . Environmental and seasonal allergies   .  Family history of adverse reaction to anesthesia    " my son hard a hard time waking up "  . GERD (gastroesophageal reflux disease)   . Headache   . Helicobacter pylori gastritis   . Hernia, abdominal   . History of kidney stones   . Irregular heartbeat   . Pneumonia   . Recurrent sinus infections     Past Surgical History:  Procedure Laterality Date  . APPENDECTOMY    . BIOPSY ENDOMETRIAL    . COLONOSCOPY W/ BIOPSIES AND POLYPECTOMY    . ESOPHAGOGASTRODUODENOSCOPY (EGD) WITH PROPOFOL N/A 11/26/2016   Procedure: ESOPHAGOGASTRODUODENOSCOPY (EGD) WITH PROPOFOL;  Surgeon: Otis Brace, MD;  Location: [facility];  Service: Gastroenterology;  Laterality: N/A;  . ROTATOR CUFF REPAIR      Current Outpatient Prescriptions  Medication Sig Dispense Refill  . cetirizine (ZYRTEC) 10 MG tablet Take 10 mg by mouth daily.    . fluconazole (DIFLUCAN) 150 MG tablet Take 1 tablet (150 mg total) by mouth once a week. 3 tablet 0  . metFORMIN (GLUCOPHAGE) 500 MG tablet Take 500 mg by mouth 2 (two) times daily with a meal.    . nystatin cream (MYCOSTATIN) Apply 1 application topically 2 (two) times daily. 30 g 0  No current facility-administered medications for this visit.      ALLERGIES: Adhesive [tape]; Fish allergy; Latex; Peanut oil; and Sulfa antibiotics  Family History  Problem Relation Age of Onset  . Diabetes Mother   . Heart attack Mother   . Colon cancer Father   . Cervical cancer Sister   . Diabetes Sister   . Heart attack Sister   . Epilepsy Brother     Social History   Social History  . Marital status: Married    Spouse name: N/A  . Number of children: N/A  . Years of education: N/A   Occupational History  . Not on file.   Social History Main Topics  . Smoking status: Never Smoker  . Smokeless tobacco: Never Used  . Alcohol use Yes     Comment: occ  . Drug use: No  . Sexual activity: Yes    Partners: Male    Birth control/ protection: None   Other Topics  Concern  . Not on file   Social History Narrative  . No narrative on file    Review of Systems  Constitutional: Negative.   HENT: Negative.   Eyes: Negative.   Respiratory: Negative.   Cardiovascular: Negative.   Gastrointestinal: Negative.   Genitourinary: Positive for frequency.       Vaginal itching  Rectal itching  Musculoskeletal: Negative.   Skin: Positive for itching and rash.  Neurological: Negative.   Endo/Heme/Allergies: Negative.   Psychiatric/Behavioral: The patient is nervous/anxious.     PHYSICAL EXAMINATION:    BP (!) 160/80 (BP Location: Right Arm, Patient Position: Sitting, Cuff Size: Large)   Pulse 84   Resp 20   Wt (!) 309 lb (140.2 kg)   LMP 11/21/2016   BMI 49.87 kg/m     General appearance: alert, cooperative and appears stated age Mild erythema under her panus and bilateral grown.   Pelvic: External genitalia:  no lesions, small laceration on the left vulva from itching. Mild erythema              Urethra:  normal appearing urethra with no masses, tenderness or lesions              Bartholins and Skenes: normal                 Vagina: normal appearing vagina with normal color and discharge, no lesions              Cervix:no lesions  Chaperone was present for exam.  Wet prep: no clue, no trich, + wbc KOH: no yeast PH: 5   ASSESSMENT Candida intertrigo, no signs of vaginal yeast.  Suspect dermatitis on top of partially treated intertrigo Urinary frequency    PLAN Send affirm Urine for ua, c&s Treat with ketoconazole, hydrocortisone and atarax   An After Visit Summary was printed and given to the patient.  ~20 minutes face to face time of which over 50% was spent in counseling.

## 2017-05-02 ENCOUNTER — Telehealth: Payer: Self-pay | Admitting: *Deleted

## 2017-05-02 LAB — VAGINITIS/VAGINOSIS, DNA PROBE
CANDIDA SPECIES: NEGATIVE
GARDNERELLA VAGINALIS: NEGATIVE
Trichomonas vaginosis: NEGATIVE

## 2017-05-02 LAB — URINALYSIS, MICROSCOPIC ONLY: CASTS: NONE SEEN /LPF

## 2017-05-02 NOTE — Telephone Encounter (Signed)
Patient returning your call.

## 2017-05-02 NOTE — Telephone Encounter (Signed)
-----   Message from Salvadore Dom, MD sent at 05/02/2017 12:55 PM EDT ----- Please inform of negative vaginitis probe and negative micro ua. See if she is feeling any better since starting the new medication

## 2017-05-02 NOTE — Telephone Encounter (Signed)
Spoke with patient and gave results. She states she is feeling a little better and the itching is improving. Advised patient to call back if symptoms do not resolve. -eh

## 2017-05-02 NOTE — Telephone Encounter (Signed)
Left message to call regarding results -eh 

## 2017-05-03 ENCOUNTER — Other Ambulatory Visit: Payer: Self-pay | Admitting: Obstetrics and Gynecology

## 2017-05-03 MED ORDER — PHENAZOPYRIDINE HCL 200 MG PO TABS: 200.0000 mg | ORAL_TABLET | Freq: Three times a day (TID) | ORAL | 0 refills | Status: DC | PRN

## 2017-05-03 MED ORDER — NITROFURANTOIN MONOHYD MACRO 100 MG PO CAPS
100.0000 mg | ORAL_CAPSULE | Freq: Two times a day (BID) | ORAL | 0 refills | Status: DC
Start: 2017-05-03 — End: 2017-08-29

## 2017-05-06 LAB — URINE CULTURE

## 2017-05-08 ENCOUNTER — Telehealth: Payer: Self-pay | Admitting: *Deleted

## 2017-05-08 MED ORDER — NYSTATIN 100000 UNIT/GM EX OINT: 1.0000 "application " | TOPICAL_OINTMENT | Freq: Two times a day (BID) | CUTANEOUS | 0 refills | Status: DC

## 2017-05-08 NOTE — Telephone Encounter (Signed)
-----   Message from Salvadore Dom, MD sent at 05/07/2017  6:28 PM EDT ----- Please call and check on the patient, confirm that she started on the macrobid and let her know the infection should respond to that antibiotic

## 2017-05-08 NOTE — Telephone Encounter (Signed)
Left message to call regarding message below -eh

## 2017-05-08 NOTE — Telephone Encounter (Signed)
I've sent a script for the nystatin ointment as requested. She should try this prior to using the powder. I think she really needs to get in to see dermatology since she has such severe recurrent symptoms.

## 2017-05-08 NOTE — Telephone Encounter (Signed)
Spoke with patient and gave results. Patient states that she is still having symptoms. She is requesting a nystatin oint.and powder to help with her itching.  She states this helps most.

## 2017-05-13 NOTE — Telephone Encounter (Signed)
Left another message to call regarding message below -eh

## 2017-05-13 NOTE — Telephone Encounter (Signed)
Spoke with patient and gave message below. Patient voiced understanding. Patient has an appointment with [facility] on 05-30-17. Patient requested that I call to see if I can get the appointment moved up closer. I called and left a message on voice mail at the office requesting a call back regarding appointment-eh

## 2017-05-15 ENCOUNTER — Other Ambulatory Visit: Payer: 59

## 2017-05-17 ENCOUNTER — Other Ambulatory Visit (INDEPENDENT_AMBULATORY_CARE_PROVIDER_SITE_OTHER): Payer: 59

## 2017-05-17 DIAGNOSIS — E1165 Type 2 diabetes mellitus with hyperglycemia: Secondary | ICD-10-CM | POA: Diagnosis not present

## 2017-05-17 LAB — BASIC METABOLIC PANEL
BUN: 6 mg/dL (ref 6–23)
CHLORIDE: 100 meq/L (ref 96–112)
CO2: 28 meq/L (ref 19–32)
CREATININE: 0.64 mg/dL (ref 0.40–1.20)
Calcium: 9.4 mg/dL (ref 8.4–10.5)
GFR: 126.22 mL/min (ref >60.00)
Glucose, Bld: 91 mg/dL (ref 70–99)
Potassium: 4 mEq/L (ref 3.5–5.1)
Sodium: 135 mEq/L (ref 135–145)

## 2017-05-17 LAB — HEMOGLOBIN A1C: Hgb A1c MFr Bld: 7 % — ABNORMAL HIGH (ref 4.6–6.5)

## 2017-05-24 ENCOUNTER — Telehealth: Payer: Self-pay | Admitting: Obstetrics and Gynecology

## 2017-05-24 ENCOUNTER — Other Ambulatory Visit: Payer: Self-pay

## 2017-05-24 ENCOUNTER — Other Ambulatory Visit: Payer: Self-pay | Admitting: Obstetrics and Gynecology

## 2017-05-24 MED ORDER — METFORMIN HCL 500 MG PO TABS: ORAL_TABLET | ORAL | 1 refills | Status: DC

## 2017-05-24 NOTE — Telephone Encounter (Signed)
Patient called to check on the status her refill requests. She said, "I desperately needs these refills taken care of today. I'm not sure of the names of the prescriptions but I just need refills on the last 3 medications Dr. Talbert Nan gave me." Patient advised we normally need 48 hours to process refills requests.

## 2017-05-24 NOTE — Telephone Encounter (Signed)
error 

## 2017-05-24 NOTE — Telephone Encounter (Signed)
Medication refill request: Proctozone  Last OV:  12/27/16 JJ -- seen for Candidal Intertrigo  Next AEX: 01/02/18 Last MMG (if hormonal medication request): 08/2015 WNL per patient  Refill authorized: 05/01/17 #30g w/0 refills  Medication refill request: Ketoconazole Refill authorized: 05/01/17 #15g w/0 refills  Medication refill request: Nystatin  Refill authorized: 05/08/17 #30g w/0 refills  Today please advise

## 2017-05-28 ENCOUNTER — Telehealth: Payer: Self-pay

## 2017-05-28 NOTE — Telephone Encounter (Signed)
She can try other medications for DM II like Victoza or Trulicity, these meds are injectable ,not insulin Other oral options: Jardiance (or Farxiga),Januvia 100 mg,or my less favorable Glimepiride 4 mg before breakfast. The latter one can cause low BS's if she skips meals. 88 is not "low BS" If she still wants endocrinology evaluation it is OK to place referral.  Thanks, BJ

## 2017-05-28 NOTE — Telephone Encounter (Signed)
Patient called to report that she has not been taking Metformin as directed. She has not taken the 2 tabs qhs d/t extremely dry mouth which then keeps her from sleeping and then when she is tired she gets anxious/panicky. She also has been skipping occasional meals and has noticed her blood sugar drop to 88 and she feels shaky and tired. Advised pt to not skip meals, eat small snacks and be aware of low blood sugar signs. She is asking for referrals to Endo and Nutrition to help her manage her DM. She would also like to know what you want her to do about the Metformin.  Dr. Martinique - Please advise. Thanks!

## 2017-05-28 NOTE — Telephone Encounter (Signed)
Spoke with pt and advised. She has scheduled OV to discuss medication options. Nothing further needed at this time.

## 2017-06-04 NOTE — Progress Notes (Deleted)
HPI:   Ms.Peggye Teed is a 51 y.o. female, who is here today to discussed some of her chronic medical problems and treatment options.  She was seen on 04/15/17.  Since her last OV, she followed with her gyn, Dx with candidal intertrigo and referred back to her dermatologists.  Diabetes Mellitus II:    She did not tolerate Metformin well. *** A few treatment options were suggested but she wanted to discussed this problem here in the office.   *** denies abdominal pain, nausea, vomiting, polydipsia, polyuria, or polyphagia    Lab Results  Component Value Date   CREATININE 0.64 05/17/2017   BUN 6 05/17/2017   NA 135 05/17/2017   K 4.0 05/17/2017   CL 100 05/17/2017   CO2 28 05/17/2017    Lab Results  Component Value Date   HGBA1C 7.0 (H) 05/17/2017   Lab Results  Component Value Date   MICROALBUR 1.8 12/10/2016          Review of Systems    Current Outpatient Prescriptions on File Prior to Visit  Medication Sig Dispense Refill  . cetirizine (ZYRTEC) 10 MG tablet Take 10 mg by mouth daily.    . hydrOXYzine (ATARAX/VISTARIL) 25 MG tablet Take 1 tablet (25 mg total) by mouth 3 (three) times daily as needed. 30 tablet 0  . ketoconazole (NIZORAL) 2 % cream APPLY  CREAM TOPICALLY TO AFFECTED AREA ONCE DAILY FOR  2-3  WEEKS 15 g 0  . metFORMIN (GLUCOPHAGE) 500 MG tablet Take 1 tablet by mouth in the morning and 2 tablets by mouth in the evening. 270 tablet 1  . nitrofurantoin, macrocrystal-monohydrate, (MACROBID) 100 MG capsule Take 1 capsule (100 mg total) by mouth 2 (two) times daily. 10 capsule 0  . nystatin ointment (MYCOSTATIN) APPLY 1 OINTMENT TOPICALLY TWICE DAILY, APPLY TO AFFECTE AREA FOR UP TO 7 DAYS 15 g 0  . phenazopyridine (PYRIDIUM) 200 MG tablet Take 1 tablet (200 mg total) by mouth 3 (three) times daily as needed. 6 tablet 0  . PROCTOZONE-HC 2.5 % rectal cream USE TWICE DAILY FOR 1 WEEK, THEN ONCE A DAY FOR 1 WEEK , THEN EVERY OTHER DAY FOR  ONE WEEK 28 g 0   No current facility-administered medications on file prior to visit.      Past Medical History:  Diagnosis Date  . Anemia   . Anxiety    panic attacks  . Arthritis   . Asthma   . Borderline diabetes   . Complication of anesthesia    difficulty breathing for a few a minutes after a procedure  . Difficult intubation    pt unsure   . Environmental and seasonal allergies   . Family history of adverse reaction to anesthesia    " my son hard a hard time waking up "  . GERD (gastroesophageal reflux disease)   . Headache   . Helicobacter pylori gastritis   . Hernia, abdominal   . History of kidney stones   . Irregular heartbeat   . Pneumonia   . Recurrent sinus infections    Allergies  Allergen Reactions  . Adhesive [Tape] Hives    And Band Aids  . Fish Allergy Shortness Of Breath and Itching  . Latex Hives  . Peanut Oil   . Sulfa Antibiotics Hives    Social History   Social History  . Marital status: Married    Spouse name: N/A  . Number of children: N/A  .  Years of education: N/A   Social History Main Topics  . Smoking status: Never Smoker  . Smokeless tobacco: Never Used  . Alcohol use Yes     Comment: occ  . Drug use: No  . Sexual activity: Yes    Partners: Male    Birth control/ protection: None   Other Topics Concern  . Not on file   Social History Narrative  . No narrative on file    There were no vitals filed for this visit. There is no height or weight on file to calculate BMI.      Physical Exam    ASSESSMENT AND PLAN:     There are no diagnoses linked to this encounter.             Betty G. Martinique, MD  [facility]. [place] office.

## 2017-06-05 ENCOUNTER — Ambulatory Visit: Payer: 59 | Admitting: *Deleted

## 2017-06-16 ENCOUNTER — Other Ambulatory Visit: Payer: Self-pay | Admitting: Obstetrics and Gynecology

## 2017-06-17 NOTE — Telephone Encounter (Signed)
Medication refill request: nystatin ointment Last OV:  12/27/16 JJ  Next AEX: 01/02/18  Last MMG (if hormonal medication request): 08/2015 WNL per patient  Refill authorized: 05/24/17 #15, 0RF. Today please advise

## 2017-08-29 ENCOUNTER — Encounter: Payer: Self-pay | Admitting: Obstetrics and Gynecology

## 2017-08-29 ENCOUNTER — Other Ambulatory Visit: Payer: Self-pay

## 2017-08-29 ENCOUNTER — Ambulatory Visit: Payer: 59 | Admitting: Obstetrics and Gynecology

## 2017-08-29 VITALS — BP 148/94 | HR 80 | Resp 18 | Wt 311.0 lb

## 2017-08-29 DIAGNOSIS — N760 Acute vaginitis: Secondary | ICD-10-CM

## 2017-08-29 DIAGNOSIS — R350 Frequency of micturition: Secondary | ICD-10-CM | POA: Diagnosis not present

## 2017-08-29 DIAGNOSIS — R1030 Lower abdominal pain, unspecified: Secondary | ICD-10-CM

## 2017-08-29 LAB — POCT URINALYSIS DIPSTICK
Bilirubin, UA: NEGATIVE
Blood, UA: NEGATIVE
Glucose, UA: NEGATIVE
Ketones, UA: NEGATIVE
LEUKOCYTES UA: NEGATIVE
NITRITE UA: NEGATIVE
PH UA: 6 (ref 5.0–8.0)
PROTEIN UA: NEGATIVE
Urobilinogen, UA: NEGATIVE E.U./dL — AB

## 2017-08-29 MED ORDER — TRIAMCINOLONE ACETONIDE 0.5 % EX OINT: TOPICAL_OINTMENT | CUTANEOUS | 0 refills | Status: DC

## 2017-08-29 MED ORDER — HYDROXYZINE HCL 25 MG PO TABS: 25.0000 mg | ORAL_TABLET | Freq: Three times a day (TID) | ORAL | 0 refills | Status: DC | PRN

## 2017-08-29 MED ORDER — NITROFURANTOIN MONOHYD MACRO 100 MG PO CAPS: 100.0000 mg | ORAL_CAPSULE | Freq: Two times a day (BID) | ORAL | 0 refills | Status: DC

## 2017-08-29 MED ORDER — PHENAZOPYRIDINE HCL 200 MG PO TABS: 200.0000 mg | ORAL_TABLET | Freq: Three times a day (TID) | ORAL | 0 refills | Status: DC | PRN

## 2017-08-29 NOTE — Progress Notes (Signed)
GYNECOLOGY  VISIT   HPI: 52 y.o.   Married  [place] American  female   [phone] with Patient's last menstrual period was 06/24/2017.   here for c/o vaginal itching X3 weeks.  She has had recurrent issues with candida intertrigo.  She has diabetes, feels her symptoms of vaginal/vulvar itching are worse with metformin, states she will never take anything for her diabetes again. She has checking her FS, after eating as up to 146, sometimes she is jittery and her glucose was in the 80's. Her primary has managed her diabetes.  She was feeling fine before taking the metformin.  Now she is feeling itchy on the vulva and vaginally. She has some left over terazole cream, burns when she uses it. She has tried triamcinolone ointment one time a day for the last week. She has been rubbing the terazole external. Using nystatin cream one time a day which seems to be helping some. No abnormal vaginal discharge.  She thinks she has a bladder infection, so started some amoxicillin she had at home. Symptoms are lower back pain and lower abdominal discomfort with frequent urination. Burns externally with voiding.  She has been taking 500 mg of amoxicillin q 12 hours since Saturday (bottle says 4 x a day).  She has some mild lower abdominal pain for weeks, intermittent. BM's are softer than normal, worse odor than normal. Caliber of her stools has changed, thinner. Colonoscopy last year was normal.   GYNECOLOGIC HISTORY: Patient's last menstrual period was 06/24/2017. Contraception: none Menopausal hormone therapy: none        OB History    Gravida Para Term Preterm AB Living   3 2 2   1 2    SAB TAB Ectopic Multiple Live Births           2         Patient Active Problem List   Diagnosis Date Noted  . Eczema 04/15/2017  . Uncontrolled type 2 diabetes mellitus with hyperglycemia, without long-term current use of insulin ([place]) 12/13/2016  . Diabetes mellitus type II, uncontrolled ([place]) 12/10/2016  . Morbid  obesity with BMI of 50.0-59.9, adult ([facility]) 12/10/2016  . Allergic rhinitis 12/10/2016    Past Medical History:  Diagnosis Date  . Anemia   . Anxiety    panic attacks  . Arthritis   . Asthma   . Borderline diabetes   . Complication of anesthesia    difficulty breathing for a few a minutes after a procedure  . Difficult intubation    pt unsure   . Environmental and seasonal allergies   . Family history of adverse reaction to anesthesia    " my son hard a hard time waking up "  . GERD (gastroesophageal reflux disease)   . Headache   . Helicobacter pylori gastritis   . Hernia, abdominal   . History of kidney stones   . Irregular heartbeat   . Pneumonia   . Recurrent sinus infections     Past Surgical History:  Procedure Laterality Date  . APPENDECTOMY    . BIOPSY ENDOMETRIAL    . COLONOSCOPY W/ BIOPSIES AND POLYPECTOMY    . ESOPHAGOGASTRODUODENOSCOPY (EGD) WITH PROPOFOL N/A 11/26/2016   Procedure: ESOPHAGOGASTRODUODENOSCOPY (EGD) WITH PROPOFOL;  Surgeon: Otis Brace, MD;  Location: [facility];  Service: Gastroenterology;  Laterality: N/A;  . ROTATOR CUFF REPAIR      Current Outpatient Medications  Medication Sig Dispense Refill  . amoxicillin (AMOXIL) 500 MG capsule Take 500 mg by mouth  2 (two) times daily.    . cetirizine (ZYRTEC) 10 MG tablet Take 10 mg by mouth daily.    . hydrOXYzine (ATARAX/VISTARIL) 25 MG tablet Take 1 tablet (25 mg total) by mouth 3 (three) times daily as needed. 30 tablet 0  . mupirocin ointment (BACTROBAN) 2 % Place 1 application into the nose 2 (two) times daily.    Marland Kitchen nystatin ointment (MYCOSTATIN) APPLY  OINTMENT TOPICALLY TO AFFECTED AREA TWICE DAILY FOR 7 DAYS 30 g 1  . PROCTOZONE-HC 2.5 % rectal cream USE TWICE DAILY FOR 1 WEEK, THEN ONCE A DAY FOR 1 WEEK , THEN EVERY OTHER DAY FOR ONE WEEK 28 g 0  . terconazole (TERAZOL 7) 0.4 % vaginal cream Place 1 applicator vaginally at bedtime.    . triamcinolone (KENALOG) 0.025 % cream Apply 1  application topically 2 (two) times daily.     No current facility-administered medications for this visit.      ALLERGIES: Adhesive [tape]; Fish allergy; Latex; Peanut oil; and Sulfa antibiotics  Family History  Problem Relation Age of Onset  . Diabetes Mother   . Heart attack Mother   . Colon cancer Father   . Cervical cancer Sister   . Diabetes Sister   . Heart attack Sister   . Epilepsy Brother     Social History   Socioeconomic History  . Marital status: Married    Spouse name: Not on file  . Number of children: Not on file  . Years of education: Not on file  . Highest education level: Not on file  Social Needs  . Financial resource strain: Not on file  . Food insecurity - worry: Not on file  . Food insecurity - inability: Not on file  . Transportation needs - medical: Not on file  . Transportation needs - non-medical: Not on file  Occupational History  . Not on file  Tobacco Use  . Smoking status: Never Smoker  . Smokeless tobacco: Never Used  Substance and Sexual Activity  . Alcohol use: Yes    Comment: occ  . Drug use: No  . Sexual activity: Yes    Partners: Male    Birth control/protection: None  Other Topics Concern  . Not on file  Social History Narrative  . Not on file    Review of Systems  Constitutional: Negative.   HENT: Negative.   Eyes: Negative.   Respiratory: Negative.   Cardiovascular: Negative.   Gastrointestinal: Negative.   Genitourinary:       Vaginal itching  Musculoskeletal: Positive for back pain.  Skin: Negative.   Neurological: Negative.   Endo/Heme/Allergies: Negative.   Psychiatric/Behavioral: Negative.     PHYSICAL EXAMINATION:    BP (!) 148/94 (BP Location: Right Arm, Patient Position: Sitting, Cuff Size: Large)   Pulse 80   Resp 18   Wt (!) 311 lb (141.1 kg)   LMP 06/24/2017   BMI 50.20 kg/m     General appearance: alert, cooperative and appears stated age Abdomen: soft, mild diffuse tenderness, non  distended, no masses,  no organomegaly  Pelvic: External genitalia:  no lesions, mild erythema, no plaques, lesions or fissures.               Urethra:  normal appearing urethra with no masses, tenderness or lesions              Bartholins and Skenes: normal                 Vagina: normal  appearing mildly atrophic vagina with normal color and discharge, no lesions              Cervix: no lesions              Bimanual Exam:  Uterus:  normal size, contour, position, consistency, mobility, non-tender and anteverted              Adnexa: no masses, mildly tender on the left                Chaperone was present for exam.  Wet prep: no clue, no trich, rare wbc KOH: no yeast PH: 4.5    ASSESSMENT Vulvovaginitis, negative vaginal slides Symptoms of UTI, she has been using amoxicillin for days, symptoms slightly improved, negative dip (?partially treated) No signs of candida intertrigo    PLAN Stop using the terazol and nystatin Send affirm Treat with topical steroids (will increase the dose) for the next 1-2 weeks only, then needs to stop Vulvar skin care reviewed, use Vaseline as needed Will send urine for ua, c&s Will treat with macrobid and pyridium while awaiting the culture Atarax for pruritus Recommended she discuss abdominal discomfort and bowel issues with her primary Recommended she discuss diabetes management with her primary (states she is not going on any more medication for her diabetes). Discussed the risks of uncontrolled diabetes.  She will return in 2 weeks for an annual exam   An After Visit Summary was printed and given to the patient.  Over 30 minutes face to face time of which over 50% was spent in counseling.

## 2017-08-30 LAB — URINALYSIS, MICROSCOPIC ONLY
Bacteria, UA: NONE SEEN
Casts: NONE SEEN /lpf

## 2017-08-30 LAB — VAGINITIS/VAGINOSIS, DNA PROBE
Candida Species: NEGATIVE
GARDNERELLA VAGINALIS: NEGATIVE
TRICHOMONAS VAG: NEGATIVE

## 2017-09-01 LAB — URINE CULTURE

## 2017-09-03 ENCOUNTER — Telehealth: Payer: Self-pay | Admitting: Obstetrics and Gynecology

## 2017-09-03 NOTE — Telephone Encounter (Signed)
Patient received a message via MyChart with her results and to call if she is not feeling better. Patient would like to talk with a nurse.

## 2017-09-03 NOTE — Telephone Encounter (Signed)
Left message to call Clair Bardwell at [phone].  

## 2017-09-03 NOTE — Telephone Encounter (Addendum)
Spoke with patient. Reports voiding q1 hr, urgency and voids are "regular, full amounts". Only tried 1-2 pyridium initially, unsure if it helped. Will try again to see if symptoms improve. Patient will return call on 1/16 with update. Patient verbalizes understanding and is agreeable.  Routing to Dr. Rosann Auerbach.

## 2017-09-03 NOTE — Telephone Encounter (Signed)
Spoke with patient. Calling with update for Dr. Talbert Nan.   Completed macrobid today, urinary symptoms have not resolved, have improved. Reports lower back pain and frequency. Only took 1-2 pyridium.  08/29/17 UC and micro negative.  Denies any other symptoms, reports other "issues" have resolved.   Recommended OV for further evaluation, patient declined. Patient request Dr. Talbert Nan review and send in additional antibiotics or repeat UA if needed. Advised will review with Dr. Talbert Nan and return call. Patient is agreeable.    Dr. Talbert Nan -please review and advise?

## 2017-09-03 NOTE — Telephone Encounter (Signed)
She didn't have a UTI on her recent culture. She doesn't have an infection now. If the pyridium made her feel better and she doesn't have a UTI I think she should see urology.  Please get a sense of how frequently she is voiding, if she is going small or large amounts and if she has urgency to void. If she had frequency, urgency and small voids we could do a trial of ditropan, otherwise I would have her see Urology.

## 2017-09-03 NOTE — Telephone Encounter (Signed)
Left message to call Khiree Bukhari at [phone].  

## 2017-09-04 NOTE — Telephone Encounter (Signed)
Spoke with patient. Patient states she did not take pyridium last night, feels symptoms are improving, feels better today.   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 Voiding q 3-4 hours, has increased fluid intake, no longer experiencing urgency.   Advised patient to continue to monitor, if symptoms do not resolve, return call to office. Patient is scheduled for AEX on 09/19/17. Will update Dr. Talbert Nan and return call with any additional recommendations.   Routing to provider for final review. Patient is agreeable to disposition. Will close encounter.

## 2017-09-05 ENCOUNTER — Telehealth: Payer: Self-pay | Admitting: Obstetrics and Gynecology

## 2017-09-05 NOTE — Telephone Encounter (Signed)
I agree with the plan.

## 2017-09-05 NOTE — Telephone Encounter (Signed)
Spoke with patient. Patient states menses started on 09/04/17, reports flow as spotting and bright red. Feels like cramping she has been experience was likely r/t menses getting ready to start. Reports menses have been irregular, last cycle 06/24/17.   Urinary symptoms have resolved. Advised patient she can take OTC motrin PRN for cramping. Continue to monitor bleeding, return call to office should bleeding increase to changing pad q1-2 hours or severe pain develop. Keep AEX scheduled for 1/31 with Dr. Talbert Nan. Will review with Dr. Talbert Nan and return call with any additional recommendations, patient is agreeable.   Dr. Talbert Nan -any additional recommendations?

## 2017-09-05 NOTE — Telephone Encounter (Signed)
Patient states Tanya Houston told her to call back today and give an update.

## 2017-09-05 NOTE — Telephone Encounter (Signed)
Left message to call Natallia Stellmach at [phone].  

## 2017-09-13 DIAGNOSIS — L304 Erythema intertrigo: Secondary | ICD-10-CM | POA: Diagnosis not present

## 2017-09-18 ENCOUNTER — Encounter: Payer: Self-pay | Admitting: Obstetrics and Gynecology

## 2017-09-18 ENCOUNTER — Ambulatory Visit: Payer: 59 | Admitting: Obstetrics and Gynecology

## 2017-09-18 ENCOUNTER — Other Ambulatory Visit: Payer: Self-pay

## 2017-09-18 VITALS — BP 118/68 | HR 76 | Resp 18 | Ht 65.5 in | Wt 309.0 lb

## 2017-09-18 DIAGNOSIS — Z124 Encounter for screening for malignant neoplasm of cervix: Secondary | ICD-10-CM

## 2017-09-18 DIAGNOSIS — Z01419 Encounter for gynecological examination (general) (routine) without abnormal findings: Secondary | ICD-10-CM | POA: Diagnosis not present

## 2017-09-18 DIAGNOSIS — N946 Dysmenorrhea, unspecified: Secondary | ICD-10-CM

## 2017-09-18 DIAGNOSIS — K645 Perianal venous thrombosis: Secondary | ICD-10-CM | POA: Diagnosis not present

## 2017-09-18 DIAGNOSIS — N914 Secondary oligomenorrhea: Secondary | ICD-10-CM

## 2017-09-18 DIAGNOSIS — Z8619 Personal history of other infectious and parasitic diseases: Secondary | ICD-10-CM | POA: Diagnosis not present

## 2017-09-18 DIAGNOSIS — N952 Postmenopausal atrophic vaginitis: Secondary | ICD-10-CM

## 2017-09-18 DIAGNOSIS — N763 Subacute and chronic vulvitis: Secondary | ICD-10-CM

## 2017-09-18 DIAGNOSIS — K625 Hemorrhage of anus and rectum: Secondary | ICD-10-CM | POA: Diagnosis not present

## 2017-09-18 DIAGNOSIS — N951 Menopausal and female climacteric states: Secondary | ICD-10-CM | POA: Diagnosis not present

## 2017-09-18 MED ORDER — ESTRADIOL 10 MCG VA TABS: 1.0000 | ORAL_TABLET | VAGINAL | 3 refills | Status: DC

## 2017-09-18 MED ORDER — IBUPROFEN 800 MG PO TABS: 800.0000 mg | ORAL_TABLET | Freq: Three times a day (TID) | ORAL | 1 refills | Status: DC | PRN

## 2017-09-18 MED ORDER — MEDROXYPROGESTERONE ACETATE 5 MG PO TABS: ORAL_TABLET | ORAL | 1 refills | Status: DC

## 2017-09-18 NOTE — Patient Instructions (Signed)
EXERCISE AND DIET:  We recommended that you start or continue a regular exercise program for good health. Regular exercise means any activity that makes your heart beat faster and makes you sweat.  We recommend exercising at least 30 minutes per day at least 3 days a week, preferably 4 or 5.  We also recommend a diet low in fat and sugar.  Inactivity, poor dietary choices and obesity can cause diabetes, heart attack, stroke, and kidney damage, among others.    ALCOHOL AND SMOKING:  Women should limit their alcohol intake to no more than 7 drinks/beers/glasses of wine (combined, not each!) per week. Moderation of alcohol intake to this level decreases your risk of breast cancer and liver damage. And of course, no recreational drugs are part of a healthy lifestyle.  And absolutely no smoking or even second hand smoke. Most people know smoking can cause heart and lung diseases, but did you know it also contributes to weakening of your bones? Aging of your skin?  Yellowing of your teeth and nails?  CALCIUM AND VITAMIN D:  Adequate intake of calcium and Vitamin D are recommended.  The recommendations for exact amounts of these supplements seem to change often, but generally speaking 600 mg of calcium (either carbonate or citrate) and 800 units of Vitamin D per day seems prudent. Certain women may benefit from higher intake of Vitamin D.  If you are among these women, your doctor will have told you during your visit.    PAP SMEARS:  Pap smears, to check for cervical cancer or precancers,  have traditionally been done yearly, although recent scientific advances have shown that most women can have pap smears less often.  However, every woman still should have a physical exam from her gynecologist every year. It will include a breast check, inspection of the vulva and vagina to check for abnormal growths or skin changes, a visual exam of the cervix, and then an exam to evaluate the size and shape of the uterus and  ovaries.  And after 51 years of age, a rectal exam is indicated to check for rectal cancers. We will also provide age appropriate advice regarding health maintenance, like when you should have certain vaccines, screening for sexually transmitted diseases, bone density testing, colonoscopy, mammograms, etc.   MAMMOGRAMS:  All women over 40 years old should have a yearly mammogram. Many facilities now offer a "3D" mammogram, which may cost around $50 extra out of pocket. If possible,  we recommend you accept the option to have the 3D mammogram performed.  It both reduces the number of women who will be called back for extra views which then turn out to be normal, and it is better than the routine mammogram at detecting truly abnormal areas.    COLONOSCOPY:  Colonoscopy to screen for colon cancer is recommended for all women at age 50.  We know, you hate the idea of the prep.  We agree, BUT, having colon cancer and not knowing it is worse!!  Colon cancer so often starts as a polyp that can be seen and removed at colonscopy, which can quite literally save your life!  And if your first colonoscopy is normal and you have no family history of colon cancer, most women don't have to have it again for 10 years.  Once every ten years, you can do something that may end up saving your life, right?  We will be happy to help you get it scheduled when you are ready.    Be sure to check your insurance coverage so you understand how much it will cost.  It may be covered as a preventative service at no cost, but you should check your particular policy.      Breast Self-Awareness Breast self-awareness means being familiar with how your breasts look and feel. It involves checking your breasts regularly and reporting any changes to your health care provider. Practicing breast self-awareness is important. A change in your breasts can be a sign of a serious medical problem. Being familiar with how your breasts look and feel allows  you to find any problems early, when treatment is more likely to be successful. All women should practice breast self-awareness, including women who have had breast implants. How to do a breast self-exam One way to learn what is normal for your breasts and whether your breasts are changing is to do a breast self-exam. To do a breast self-exam: Look for Changes  1. Remove all the clothing above your waist. 2. Stand in front of a mirror in a room with good lighting. 3. Put your hands on your hips. 4. Push your hands firmly downward. 5. Compare your breasts in the mirror. Look for differences between them (asymmetry), such as: ? Differences in shape. ? Differences in size. ? Puckers, dips, and bumps in one breast and not the other. 6. Look at each breast for changes in your skin, such as: ? Redness. ? Scaly areas. 7. Look for changes in your nipples, such as: ? Discharge. ? Bleeding. ? Dimpling. ? Redness. ? A change in position. Feel for Changes  Carefully feel your breasts for lumps and changes. It is best to do this while lying on your back on the floor and again while sitting or standing in the shower or tub with soapy water on your skin. Feel each breast in the following way:  Place the arm on the side of the breast you are examining above your head.  Feel your breast with the other hand.  Start in the nipple area and make  inch (2 cm) overlapping circles to feel your breast. Use the pads of your three middle fingers to do this. Apply light pressure, then medium pressure, then firm pressure. The light pressure will allow you to feel the tissue closest to the skin. The medium pressure will allow you to feel the tissue that is a little deeper. The firm pressure will allow you to feel the tissue close to the ribs.  Continue the overlapping circles, moving downward over the breast until you feel your ribs below your breast.  Move one finger-width toward the center of the body.  Continue to use the  inch (2 cm) overlapping circles to feel your breast as you move slowly up toward your collarbone.  Continue the up and down exam using all three pressures until you reach your armpit.  Write Down What You Find  Write down what is normal for each breast and any changes that you find. Keep a written record with breast changes or normal findings for each breast. By writing this information down, you do not need to depend only on memory for size, tenderness, or location. Write down where you are in your menstrual cycle, if you are still menstruating. If you are having trouble noticing differences in your breasts, do not get discouraged. With time you will become more familiar with the variations in your breasts and more comfortable with the exam. How often should I examine my breasts? Examine   your breasts every month. If you are breastfeeding, the best time to examine your breasts is after a feeding or after using a breast pump. If you menstruate, the best time to examine your breasts is 5-7 days after your period is over. During your period, your breasts are lumpier, and it may be more difficult to notice changes. When should I see my health care provider? See your health care provider if you notice:  A change in shape or size of your breasts or nipples.  A change in the skin of your breast or nipples, such as a reddened or scaly area.  Unusual discharge from your nipples.  A lump or thick area that was not there before.  Pain in your breasts.  Anything that concerns you.  This information is not intended to replace advice given to you by your health care provider. Make sure you discuss any questions you have with your health care provider. Document Released: 08/06/2005 Document Revised: 01/12/2016 Document Reviewed: 06/26/2015 [employer] Interactive Patient Education  2018 [employer]  

## 2017-09-18 NOTE — Progress Notes (Signed)
52 y.o. [id number] MarriedAfrican AmericanF here for annual exam.   She saw dermatology last week for her chronic vulvitis, told her to keep using what she is using.  She doesn't use tampons, was told not to by prior gynecologist, had bad "reaction".  Cycle are very irregular, she can go 4-6 months without a cycle, or in the past 2 x a month. In the last year she has had the following cycles: 4/18 for 6 days, 7/18 for 6 days, 10/18 for 7 days, 11/18 for 4 days, 1/19 light cycle x 6 days.  Can saturate a pad every couple of hours. Bad cramps. Normal TSH and prolactin in the summer. She has had vasomotor symptoms for many years, no change.   Period Duration (Days): 7 days  Period Pattern: (!) Irregular Menstrual Flow: Light, Moderate Menstrual Control: Maxi pad, Thin pad Menstrual Control Change Freq (Hours): changes pad 1-2 timesa day  Dysmenorrhea: (!) Moderate Dysmenorrhea Symptoms: Cramping  Not sexually active, too dry, too painful, feels like someone is cutting the inside of her.   Patient's last menstrual period was 09/04/2017.          Sexually active: No  The current method of family planning is none.    Exercising: No.  The patient does not participate in regular exercise at present. Smoker:  no  Health Maintenance: Pap:   History of abnormal Pap:  no MMG:  09/1016 WNL per patient with Dr Leo Grosser Colonoscopy:  10/2016 WNL per patient  BMD:   Years ago TDaP:  unsure Gardasil: N/A   reports that  has never smoked. she has never used smokeless tobacco. She reports that she drinks alcohol. She reports that she does not use drugs.  Past Medical History:  Diagnosis Date  . Anemia   . Anxiety    panic attacks  . Arthritis   . Asthma   . Complication of anesthesia    difficulty breathing for a few a minutes after a procedure  . Difficult intubation    pt unsure   . Environmental and seasonal allergies   . Family history of adverse reaction to anesthesia    " my son hard a  hard time waking up "  . GERD (gastroesophageal reflux disease)   . Headache   . Helicobacter pylori gastritis   . Hernia, abdominal   . History of kidney stones   . Irregular heartbeat   . Pneumonia   . Recurrent sinus infections   . Type II diabetes mellitus ([employer])     Past Surgical History:  Procedure Laterality Date  . APPENDECTOMY    . BIOPSY ENDOMETRIAL    . COLONOSCOPY W/ BIOPSIES AND POLYPECTOMY    . ESOPHAGOGASTRODUODENOSCOPY (EGD) WITH PROPOFOL N/A 11/26/2016   Procedure: ESOPHAGOGASTRODUODENOSCOPY (EGD) WITH PROPOFOL;  Surgeon: Otis Brace, MD;  Location: [place];  Service: Gastroenterology;  Laterality: N/A;  . ROTATOR CUFF REPAIR      Current Outpatient Medications  Medication Sig Dispense Refill  . cetirizine (ZYRTEC) 10 MG tablet Take 10 mg by mouth daily.    . hydrOXYzine (ATARAX/VISTARIL) 25 MG tablet Take 1 tablet (25 mg total) by mouth 3 (three) times daily as needed. 30 tablet 0  . mupirocin ointment (BACTROBAN) 2 % Place 1 application into the nose 2 (two) times daily.    . nitrofurantoin, macrocrystal-monohydrate, (MACROBID) 100 MG capsule Take 1 capsule (100 mg total) by mouth 2 (two) times daily. 10 capsule 0  . nystatin ointment (MYCOSTATIN) APPLY  OINTMENT TOPICALLY  TO AFFECTED AREA TWICE DAILY FOR 7 DAYS 30 g 1  . PROCTOZONE-HC 2.5 % rectal cream USE TWICE DAILY FOR 1 WEEK, THEN ONCE A DAY FOR 1 WEEK , THEN EVERY OTHER DAY FOR ONE WEEK 28 g 0  . terconazole (TERAZOL 7) 0.4 % vaginal cream Place 1 applicator vaginally at bedtime.    . triamcinolone ointment (KENALOG) 0.5 % Apply a pea sized amount topically bid x 1-2 weeks. Not for daily long term use 15 g 0  . Estradiol 10 MCG TABS vaginal tablet Place 1 tablet (10 mcg total) vaginally 2 (two) times a week. 24 tablet 3  . ibuprofen (ADVIL,MOTRIN) 800 MG tablet Take 1 tablet (800 mg total) by mouth every 8 (eight) hours as needed. 30 tablet 1  . medroxyPROGESTERone (PROVERA) 5 MG tablet Take one  tablet a day for 5 days every other month if you don't have a spontaneous menses 15 tablet 1   No current facility-administered medications for this visit.     Family History  Problem Relation Age of Onset  . Diabetes Mother   . Heart attack Mother   . Colon cancer Father   . Cervical cancer Sister   . Diabetes Sister   . Heart attack Sister   . Epilepsy Brother     Review of Systems  Constitutional: Negative.   HENT: Negative.   Eyes: Negative.   Respiratory: Negative.   Cardiovascular: Negative.   Gastrointestinal: Positive for abdominal pain, diarrhea and nausea.       Bloating  Endocrine: Negative.   Genitourinary: Positive for dyspareunia.       Vaginal itching  dysmenorrhea  Musculoskeletal: Negative.   Skin: Positive for rash.       Hair loss  Allergic/Immunologic: Negative.   Neurological: Negative.   Psychiatric/Behavioral: Negative.    She has an appointment with GI today  Exam:   BP 118/68 (BP Location: Right Wrist, Patient Position: Sitting)   Pulse 76   Resp 18   Ht 5' 5.5" (1.664 m)   Wt (!) 309 lb (140.2 kg)   LMP 09/04/2017   BMI 50.64 kg/m   Weight change: @WEIGHTCHANGE @ Height:   Height: 5' 5.5" (166.4 cm)  Ht Readings from Last 3 Encounters:  09/18/17 5' 5.5" (1.664 m)  04/15/17 5\' 6"  (1.676 m)  12/27/16 5\' 6"  (1.676 m)    General appearance: alert, cooperative and appears stated age Head: Normocephalic, without obvious abnormality, atraumatic Neck: no adenopathy, supple, symmetrical, trachea midline and thyroid normal to inspection and palpation Lungs: clear to auscultation bilaterally Cardiovascular: regular rate and rhythm Breasts: normal appearance, no masses or tenderness Abdomen: soft, non-tender; non distended,  no masses,  no organomegaly Extremities: extremities normal, atraumatic, no cyanosis or edema Skin: Skin color, texture, turgor normal. No rashes or lesions Lymph nodes: Cervical, supraclavicular, and axillary nodes  normal. No abnormal inguinal nodes palpated Neurologic: Grossly normal   Pelvic: External genitalia:  no lesions, mild irritation, no plaques, fissures, whitening or lesions.              Urethra:  normal appearing urethra with no masses, tenderness or lesions              Bartholins and Skenes: normal                 Vagina: normal appearing vagina with mild atrophy, normal color and discharge, no lesions              Cervix: no lesions  Bimanual Exam:  Uterus:  normal size, contour, position, consistency, mobility, non-tender              Adnexa: no mass, fullness, tenderness               Rectovaginal: Confirms               Anus:  normal sphincter tone, no lesions  Chaperone was present for exam.  A:  Well Woman with normal exam  Chronic issues with vulvitis, negative vaginitis panels, aware of vulvar skin care  Diabetes, not currently on medication, needs to f/u with her primary MD  Irregular cycles, c/w perimenopause. Normal TSH and prolactin this year  Vaginal atrophy with dyspareunia  Dysmenorrhea, helped with ibuprofen  She has an appointment with GI today for abdominal pain, nausea and diarrhea  P:   Pap with hpv  Discussed breast self exam  Discussed calcium and vit D intake  Colonoscopy UTD  She has steroid ointment for intermittent use, use Vaseline or coconut oil as needed  Recommended she use cyclic provera to bring on cycles, discussed reasoning  Start vaginal estrogen  Mammogram next month  Colonoscopy is UTD

## 2017-09-19 ENCOUNTER — Other Ambulatory Visit (HOSPITAL_COMMUNITY)
Admission: RE | Admit: 2017-09-19 | Discharge: 2017-09-19 | Disposition: A | Discharge status: Home / Self Care | Payer: 59 | Source: Ambulatory Visit | Attending: Obstetrics and Gynecology | Admitting: Obstetrics and Gynecology

## 2017-09-19 ENCOUNTER — Ambulatory Visit: Payer: 59 | Admitting: Obstetrics and Gynecology

## 2017-09-19 ENCOUNTER — Encounter: Payer: Self-pay | Admitting: Obstetrics and Gynecology

## 2017-09-19 DIAGNOSIS — Z124 Encounter for screening for malignant neoplasm of cervix: Secondary | ICD-10-CM | POA: Diagnosis present

## 2017-09-19 NOTE — Addendum Note (Signed)
Addended by: Dorothy Spark on: 09/19/2017 05:33 PM   Modules accepted: Orders

## 2017-09-24 LAB — CYTOLOGY - PAP: HPV (WINDOPATH): NOT DETECTED

## 2017-09-27 ENCOUNTER — Telehealth: Payer: Self-pay | Admitting: *Deleted

## 2017-09-27 NOTE — Telephone Encounter (Signed)
-----   Message from Salvadore Dom, MD sent at 09/24/2017  5:44 PM EST ----- Please let the patient know that her pap returned with LSIL, but negative testing for the high risk strains of HPV (still caused by hpv, just not one of the higher risk strains). Given the negative HPV, the recommendation if for a repeat pap and hpv in one year.

## 2017-09-27 NOTE — Telephone Encounter (Signed)
Notes recorded by Burnice Logan, RN on 09/27/2017 at 3:05 PM EST Left message to call Sharee Pimple at [phone].

## 2017-09-30 NOTE — Telephone Encounter (Signed)
Spoke with patient, advised as seen below per Dr. Talbert Nan. Rescheduled AEX to earlier date, 09/22/2018 at 3pm with Dr. Talbert Nan, patient verbalizes understanding and is agreeable.   08 recall placed.   Routing to provider for final review. Patient is agreeable to disposition. Will close encounter.

## 2017-10-06 DIAGNOSIS — R0602 Shortness of breath: Secondary | ICD-10-CM | POA: Diagnosis not present

## 2017-10-06 DIAGNOSIS — M25512 Pain in left shoulder: Secondary | ICD-10-CM | POA: Diagnosis not present

## 2017-10-06 DIAGNOSIS — R072 Precordial pain: Secondary | ICD-10-CM | POA: Diagnosis not present

## 2017-10-06 DIAGNOSIS — E611 Iron deficiency: Secondary | ICD-10-CM | POA: Diagnosis not present

## 2017-10-07 ENCOUNTER — Emergency Department (HOSPITAL_COMMUNITY)
Admission: EM | Admit: 2017-10-07 | Discharge: 2017-10-07 | Disposition: A | Discharge status: Home / Self Care | Payer: 59 | Attending: Emergency Medicine | Admitting: Emergency Medicine

## 2017-10-07 ENCOUNTER — Encounter (HOSPITAL_COMMUNITY): Payer: Self-pay | Admitting: Emergency Medicine

## 2017-10-07 ENCOUNTER — Emergency Department (HOSPITAL_COMMUNITY): Payer: 59

## 2017-10-07 DIAGNOSIS — E119 Type 2 diabetes mellitus without complications: Secondary | ICD-10-CM | POA: Diagnosis not present

## 2017-10-07 DIAGNOSIS — M5412 Radiculopathy, cervical region: Secondary | ICD-10-CM

## 2017-10-07 DIAGNOSIS — Z91040 Latex allergy status: Secondary | ICD-10-CM | POA: Diagnosis not present

## 2017-10-07 DIAGNOSIS — R05 Cough: Secondary | ICD-10-CM | POA: Diagnosis not present

## 2017-10-07 DIAGNOSIS — Z79899 Other long term (current) drug therapy: Secondary | ICD-10-CM | POA: Diagnosis not present

## 2017-10-07 DIAGNOSIS — R079 Chest pain, unspecified: Secondary | ICD-10-CM | POA: Diagnosis not present

## 2017-10-07 DIAGNOSIS — J45909 Unspecified asthma, uncomplicated: Secondary | ICD-10-CM | POA: Diagnosis not present

## 2017-10-07 DIAGNOSIS — Z91010 Allergy to peanuts: Secondary | ICD-10-CM | POA: Diagnosis not present

## 2017-10-07 DIAGNOSIS — M79602 Pain in left arm: Secondary | ICD-10-CM | POA: Diagnosis not present

## 2017-10-07 DIAGNOSIS — E7800 Pure hypercholesterolemia, unspecified: Secondary | ICD-10-CM | POA: Diagnosis not present

## 2017-10-07 DIAGNOSIS — M542 Cervicalgia: Secondary | ICD-10-CM | POA: Diagnosis not present

## 2017-10-07 LAB — CBC
HCT: 34.1 % — ABNORMAL LOW (ref 36.0–46.0)
Hemoglobin: 10.7 g/dL — ABNORMAL LOW (ref 12.0–15.0)
MCH: 23.4 pg — ABNORMAL LOW (ref 26.0–34.0)
MCHC: 31.4 g/dL (ref 30.0–36.0)
MCV: 74.6 fL — ABNORMAL LOW (ref 78.0–100.0)
Platelets: 284 10*3/uL (ref 150–400)
RBC: 4.57 MIL/uL (ref 3.87–5.11)
RDW: 16.7 % — ABNORMAL HIGH (ref 11.5–15.5)
WBC: 11.7 10*3/uL — ABNORMAL HIGH (ref 4.0–10.5)

## 2017-10-07 LAB — BASIC METABOLIC PANEL
Anion gap: 10 (ref 5–15)
BUN: 9 mg/dL (ref 6–20)
CO2: 23 mmol/L (ref 22–32)
Calcium: 9.3 mg/dL (ref 8.9–10.3)
Chloride: 105 mmol/L (ref 101–111)
Creatinine, Ser: 0.79 mg/dL (ref 0.44–1.00)
GFR calc Af Amer: >60 mL/min (ref >60)
GFR calc non Af Amer: >60 mL/min (ref >60)
Glucose, Bld: 212 mg/dL — ABNORMAL HIGH (ref 65–99)
Potassium: 3.5 mmol/L (ref 3.5–5.1)
Sodium: 138 mmol/L (ref 135–145)

## 2017-10-07 LAB — TROPONIN I: Troponin I: <0.03 ng/mL (ref <0.03)

## 2017-10-07 LAB — I-STAT BETA HCG BLOOD, ED (MC, WL, AP ONLY): I-stat hCG, quantitative: <5 m[IU]/mL (ref <5)

## 2017-10-07 LAB — I-STAT TROPONIN, ED: Troponin i, poc: 0.01 ng/mL (ref 0.00–0.08)

## 2017-10-07 MED ORDER — PREDNISONE 20 MG PO TABS: 40.0000 mg | ORAL_TABLET | Freq: Every day | ORAL | 0 refills | Status: DC

## 2017-10-07 MED ORDER — OXYCODONE-ACETAMINOPHEN 5-325 MG PO TABS: 1.0000 | ORAL_TABLET | ORAL | 0 refills | Status: DC | PRN

## 2017-10-07 NOTE — ED Triage Notes (Signed)
Patient sent from [facility] after being seen both yesterday and today for chest pain radiating to bilateral shoulders and down left arm. Reports sent for further evaluation after change in EKG from yesterday to today. Denies SOB, N/V. Reports taking steroid and muscle relaxer with no relief. Extensive family cardiac history.

## 2017-10-08 NOTE — ED Provider Notes (Signed)
[facility] DEPT Provider Note   CSN: [id number] Arrival date & time: 10/07/17  1459     History   Chief Complaint Chief Complaint  Patient presents with  . Chest Pain    HPI Tanya Houston is a 52 y.o. female.  HPI   52 year old female with neck, shoulder and right upper extremity pain.  She has been having this pain since Friday.  Initially treated with muscle relaxants and steroids.  Some improvement.  She followed up today and provider felt that she needed further evaluation in the emergency room.  Her pain is worse with certain movements of her neck.  Describes shooting pain down into her hand.  Some numbness.  Past Medical History:  Diagnosis Date  . Anemia   . Anxiety    panic attacks  . Arthritis   . Asthma   . Complication of anesthesia    difficulty breathing for a few a minutes after a procedure  . Difficult intubation    pt unsure   . Environmental and seasonal allergies   . Family history of adverse reaction to anesthesia    " my son hard a hard time waking up "  . GERD (gastroesophageal reflux disease)   . Headache   . Helicobacter pylori gastritis   . Hernia, abdominal   . History of kidney stones   . Irregular heartbeat   . Pneumonia   . Recurrent sinus infections   . Type II diabetes mellitus [facility])     Patient Active Problem List   Diagnosis Date Noted  . Type II diabetes mellitus ([place])   . Eczema 04/15/2017  . Uncontrolled type 2 diabetes mellitus with hyperglycemia, without long-term current use of insulin ([place]) 12/13/2016  . Diabetes mellitus type II, uncontrolled ([place]) 12/10/2016  . Morbid obesity with BMI of 50.0-59.9, adult ([place]) 12/10/2016  . Allergic rhinitis 12/10/2016    Past Surgical History:  Procedure Laterality Date  . APPENDECTOMY    . BIOPSY ENDOMETRIAL    . COLONOSCOPY W/ BIOPSIES AND POLYPECTOMY    . ESOPHAGOGASTRODUODENOSCOPY (EGD) WITH PROPOFOL N/A 11/26/2016   Procedure:  ESOPHAGOGASTRODUODENOSCOPY (EGD) WITH PROPOFOL;  Surgeon: Otis Brace, MD;  Location: [place];  Service: Gastroenterology;  Laterality: N/A;  . ROTATOR CUFF REPAIR      OB History    Gravida Para Term Preterm AB Living   3 2 2   1 2    SAB TAB Ectopic Multiple Live Births           2       Home Medications    Prior to Admission medications   Medication Sig Start Date End Date Taking? Authorizing Provider  betamethasone dipropionate (DIPROLENE) 0.05 % cream Apply 1 application topically 2 (two) times daily.  08/31/17  Yes [provider]  cyclobenzaprine (FLEXERIL) 10 MG tablet Take 10 mg by mouth 3 (three) times daily as needed for muscle spasms.  10/06/17  Yes [provider]  Estradiol 10 MCG TABS vaginal tablet Place 1 tablet (10 mcg total) vaginally 2 (two) times a week. 09/19/17  Yes Salvadore Dom, MD  hydrOXYzine (ATARAX/VISTARIL) 25 MG tablet Take 1 tablet (25 mg total) by mouth 3 (three) times daily as needed. Patient taking differently: Take 25 mg by mouth 3 (three) times daily as needed for itching.  08/29/17  Yes Salvadore Dom, MD  ibuprofen (ADVIL,MOTRIN) 800 MG tablet Take 1 tablet (800 mg total) by mouth every 8 (eight) hours as needed. Patient taking  differently: Take 800 mg by mouth every 8 (eight) hours as needed for moderate pain.  09/18/17  Yes Salvadore Dom, MD  mupirocin ointment (BACTROBAN) 2 % Place 1 application into the nose 2 (two) times daily.   Yes [provider]  nystatin cream (MYCOSTATIN) Apply 1 application topically 2 (two) times daily.  09/13/17  Yes [provider]  nystatin ointment (MYCOSTATIN) APPLY  OINTMENT TOPICALLY TO AFFECTED AREA TWICE DAILY FOR 7 DAYS 06/17/17  Yes Salvadore Dom, MD  PROCTOZONE-HC 2.5 % rectal cream USE TWICE DAILY FOR 1 WEEK, THEN ONCE A DAY FOR 1 WEEK , THEN EVERY OTHER DAY FOR ONE WEEK 05/24/17  Yes Nunzio Cobbs, MD  terconazole (TERAZOL 7) 0.4 %  vaginal cream Place 1 applicator vaginally at bedtime.   Yes [provider]  triamcinolone (KENALOG) 0.025 % ointment Apply 1 application topically 2 (two) times daily.  07/31/17  Yes [provider]  triamcinolone ointment (KENALOG) 0.5 % Apply a pea sized amount topically bid x 1-2 weeks. Not for daily long term use 08/29/17  Yes Salvadore Dom, MD  hydrocortisone (ANUSOL-HC) 25 MG suppository Place 25 mg rectally 2 (two) times daily as needed for hemorrhoids.  09/18/17   [provider]  medroxyPROGESTERone (PROVERA) 5 MG tablet Take one tablet a day for 5 days every other month if you don't have a spontaneous menses 09/18/17   Salvadore Dom, MD  nitrofurantoin, macrocrystal-monohydrate, (MACROBID) 100 MG capsule Take 1 capsule (100 mg total) by mouth 2 (two) times daily. Patient not taking: Reported on 10/07/2017 08/29/17   Salvadore Dom, MD  oxyCODONE-acetaminophen (PERCOCET/ROXICET) 5-325 MG tablet Take 1 tablet by mouth every 4 (four) hours as needed for severe pain. 10/07/17   Virgel Manifold, MD  pantoprazole (PROTONIX) 40 MG tablet Take 40 mg by mouth daily as needed (heartburn).  09/18/17   [provider]  predniSONE (DELTASONE) 20 MG tablet Take 2 tablets (40 mg total) by mouth daily. 10/07/17   Virgel Manifold, MD    Family History Family History  Problem Relation Age of Onset  . Diabetes Mother   . Heart attack Mother   . Colon cancer Father   . Cervical cancer Sister   . Diabetes Sister   . Heart attack Sister   . Epilepsy Brother     Social History Social History   Tobacco Use  . Smoking status: Never Smoker  . Smokeless tobacco: Never Used  Substance Use Topics  . Alcohol use: Yes    Comment: occ  . Drug use: No     Allergies   Adhesive [tape]; Fish allergy; Latex; Peanut oil; and Sulfa antibiotics   Review of Systems Review of Systems  All systems reviewed and negative, other than as noted in  HPI.  Physical Exam Updated Vital Signs BP 125/79   Pulse 64   Temp 98.1 F (36.7 C) (Oral)   Resp 16   SpO2 98%   Physical Exam  Constitutional: She appears well-developed and well-nourished. No distress.  HENT:  Head: Normocephalic and atraumatic.  Eyes: Conjunctivae are normal. Right eye exhibits no discharge. Left eye exhibits no discharge.  Neck: Neck supple.  Cardiovascular: Normal rate, regular rhythm and normal heart sounds. Exam reveals no gallop and no friction rub.  No murmur heard. Pulmonary/Chest: Effort normal and breath sounds normal. No respiratory distress.  Abdominal: Soft. She exhibits no distension. There is no tenderness.  Musculoskeletal: She exhibits no edema or  tenderness.  Tenderness to palpation along the left lateral neck.  Symptoms reproducible range of motion of the left shoulder neck flexion.  She is neurovascularly intact in left upper extremity.  No concerning skin lesions noted.  Neurological: She is alert.  Skin: Skin is warm and dry.  Psychiatric: She has a normal mood and affect. Her behavior is normal. Thought content normal.  Nursing note and vitals reviewed.    ED Treatments / Results  Labs (all labs ordered are listed, but only abnormal results are displayed) Labs Reviewed  BASIC METABOLIC PANEL - Abnormal; Notable for the following components:      Result Value   Glucose, Bld 212 (*)    All other components within normal limits  CBC - Abnormal; Notable for the following components:   WBC 11.7 (*)    Hemoglobin 10.7 (*)    HCT 34.1 (*)    MCV 74.6 (*)    MCH 23.4 (*)    RDW 16.7 (*)    All other components within normal limits  TROPONIN I  I-STAT TROPONIN, ED  I-STAT BETA HCG BLOOD, ED ([facility], [facility], [facility] ONLY)    EKG  EKG Interpretation  Date/Time:  Monday October 07 2017 15:18:38 EST Ventricular Rate:  78 PR Interval:    QRS Duration: 74 QT Interval:  331 QTC Calculation: 377 R Axis:   60 Text Interpretation:  Sinus  rhythm Nonspecific T abnormalities, diffuse leads Baseline wander in lead(s) II Confirmed by Virgel Manifold [phone]) on 10/07/2017 8:46:23 PM       Radiology Dg Chest 2 View  Result Date: 10/07/2017 CLINICAL DATA:  Chest pain radiating to the shoulders and down the left arm. Cough beginning 3 days ago. EXAM: CHEST  2 VIEW COMPARISON:  None. FINDINGS: Heart size is normal. Mediastinal shadows are normal. The right lung is clear. Question mild patchy infiltrate in the posterior left lower lobe. No lobar consolidation or collapse. No effusions. No bone abnormality. IMPRESSION: Question small area of infiltrate in the posterior left lower lobe. This is not certain. Electronically Signed   By: Nelson Chimes M.D.   On: 10/07/2017 15:45    Procedures Procedures (including critical care time)  Medications Ordered in ED Medications - No data to display   Initial Impression / Assessment and Plan / ED Course  I have reviewed the triage vital signs and the nursing notes.  Pertinent labs & imaging results that were available during my care of the patient were reviewed by me and considered in my medical decision making (see chart for details).     52 year old female with symptoms which seem most consistent with a cervical radiculopathy.  I doubt ACS, PE, dissection or other emergent process.  Plan continue symptomatic treatment.  She is neurovascular intact on exam.  Return precautions were discussed.  Final Clinical Impressions(s) / ED Diagnoses   Final diagnoses:  Cervical radiculopathy    ED Discharge Orders        Ordered    predniSONE (DELTASONE) 20 MG tablet  Daily     10/07/17 2111    oxyCODONE-acetaminophen (PERCOCET/ROXICET) 5-325 MG tablet  Every 4 hours PRN     10/07/17 2111       Virgel Manifold, MD 10/08/17 2201

## 2017-10-10 DIAGNOSIS — M50222 Other cervical disc displacement at C5-C6 level: Secondary | ICD-10-CM | POA: Diagnosis not present

## 2017-10-10 DIAGNOSIS — M5412 Radiculopathy, cervical region: Secondary | ICD-10-CM | POA: Diagnosis not present

## 2017-10-10 DIAGNOSIS — M7542 Impingement syndrome of left shoulder: Secondary | ICD-10-CM | POA: Diagnosis not present

## 2017-10-10 DIAGNOSIS — M25512 Pain in left shoulder: Secondary | ICD-10-CM | POA: Diagnosis not present

## 2017-10-11 DIAGNOSIS — E1140 Type 2 diabetes mellitus with diabetic neuropathy, unspecified: Secondary | ICD-10-CM | POA: Diagnosis not present

## 2017-10-11 DIAGNOSIS — M5412 Radiculopathy, cervical region: Secondary | ICD-10-CM | POA: Diagnosis not present

## 2017-10-11 DIAGNOSIS — R072 Precordial pain: Secondary | ICD-10-CM | POA: Diagnosis not present

## 2017-10-16 ENCOUNTER — Ambulatory Visit: Payer: 59 | Admitting: Family Medicine

## 2017-10-17 DIAGNOSIS — E1140 Type 2 diabetes mellitus with diabetic neuropathy, unspecified: Secondary | ICD-10-CM | POA: Diagnosis not present

## 2017-10-19 DIAGNOSIS — M5412 Radiculopathy, cervical region: Secondary | ICD-10-CM | POA: Diagnosis not present

## 2017-10-19 DIAGNOSIS — N898 Other specified noninflammatory disorders of vagina: Secondary | ICD-10-CM | POA: Diagnosis not present

## 2017-10-19 DIAGNOSIS — R3129 Other microscopic hematuria: Secondary | ICD-10-CM | POA: Diagnosis not present

## 2017-10-19 DIAGNOSIS — E1140 Type 2 diabetes mellitus with diabetic neuropathy, unspecified: Secondary | ICD-10-CM | POA: Diagnosis not present

## 2017-10-21 ENCOUNTER — Other Ambulatory Visit: Payer: Self-pay | Admitting: Obstetrics and Gynecology

## 2017-10-21 DIAGNOSIS — M7542 Impingement syndrome of left shoulder: Secondary | ICD-10-CM | POA: Diagnosis not present

## 2017-10-21 DIAGNOSIS — M5412 Radiculopathy, cervical region: Secondary | ICD-10-CM | POA: Diagnosis not present

## 2017-10-22 NOTE — Telephone Encounter (Signed)
Left message to call regarding medications -eh

## 2017-10-22 NOTE — Telephone Encounter (Signed)
I've sent in the requested steroid ointment. Please speak with the patient and make sure she is only using it intermittently. It is not for daily long term use

## 2017-10-22 NOTE — Telephone Encounter (Signed)
Patient states that she is only using it as needed. She states her sugars have been high and she's been more irritated. Patient states she has not used this cream for several weeks until now -eh

## 2017-10-22 NOTE — Telephone Encounter (Signed)
Patient called during lunch and left a message on our voice mail returning Elaine's call.

## 2017-10-22 NOTE — Telephone Encounter (Signed)
Medication refill request: Kenalog Cream  Last AEX:  09-18-17  Next AEX: 09-22-18  Last MMG (if hormonal medication request): none on file  Refill authorized: please advise

## 2017-10-28 DIAGNOSIS — M25612 Stiffness of left shoulder, not elsewhere classified: Secondary | ICD-10-CM | POA: Diagnosis not present

## 2017-10-28 DIAGNOSIS — M25512 Pain in left shoulder: Secondary | ICD-10-CM | POA: Diagnosis not present

## 2017-10-28 DIAGNOSIS — M6281 Muscle weakness (generalized): Secondary | ICD-10-CM | POA: Diagnosis not present

## 2017-10-30 DIAGNOSIS — E1140 Type 2 diabetes mellitus with diabetic neuropathy, unspecified: Secondary | ICD-10-CM | POA: Diagnosis not present

## 2017-10-30 DIAGNOSIS — R072 Precordial pain: Secondary | ICD-10-CM | POA: Diagnosis not present

## 2017-10-30 DIAGNOSIS — M5412 Radiculopathy, cervical region: Secondary | ICD-10-CM | POA: Diagnosis not present

## 2017-11-04 DIAGNOSIS — M7542 Impingement syndrome of left shoulder: Secondary | ICD-10-CM | POA: Diagnosis not present

## 2017-11-04 DIAGNOSIS — M19012 Primary osteoarthritis, left shoulder: Secondary | ICD-10-CM | POA: Diagnosis not present

## 2017-11-06 DIAGNOSIS — M5412 Radiculopathy, cervical region: Secondary | ICD-10-CM | POA: Diagnosis not present

## 2017-11-07 DIAGNOSIS — E7800 Pure hypercholesterolemia, unspecified: Secondary | ICD-10-CM | POA: Diagnosis not present

## 2017-11-07 DIAGNOSIS — E1140 Type 2 diabetes mellitus with diabetic neuropathy, unspecified: Secondary | ICD-10-CM | POA: Diagnosis not present

## 2017-11-07 DIAGNOSIS — R0602 Shortness of breath: Secondary | ICD-10-CM | POA: Diagnosis not present

## 2017-11-08 DIAGNOSIS — M5412 Radiculopathy, cervical region: Secondary | ICD-10-CM | POA: Diagnosis not present

## 2017-11-08 DIAGNOSIS — M75102 Unspecified rotator cuff tear or rupture of left shoulder, not specified as traumatic: Secondary | ICD-10-CM | POA: Diagnosis not present

## 2017-11-08 DIAGNOSIS — M12812 Other specific arthropathies, not elsewhere classified, left shoulder: Secondary | ICD-10-CM | POA: Diagnosis not present

## 2017-11-20 ENCOUNTER — Ambulatory Visit: Payer: 59 | Attending: Sports Medicine | Admitting: Physical Therapy

## 2017-11-20 DIAGNOSIS — M545 Low back pain: Secondary | ICD-10-CM | POA: Diagnosis present

## 2017-11-20 DIAGNOSIS — M25612 Stiffness of left shoulder, not elsewhere classified: Secondary | ICD-10-CM | POA: Diagnosis present

## 2017-11-20 DIAGNOSIS — M25512 Pain in left shoulder: Secondary | ICD-10-CM | POA: Diagnosis not present

## 2017-11-20 DIAGNOSIS — M5412 Radiculopathy, cervical region: Secondary | ICD-10-CM | POA: Diagnosis not present

## 2017-11-20 NOTE — Therapy (Signed)
[place], [place], [address] Phone: [phone]   Fax:  [phone]  Physical Therapy Evaluation  Patient Details  Name: Tanya Houston MRN: [id number] Date of Birth: Jul 25, 1966 Referring Provider: Carolin Sicks MD   Encounter Date: 11/20/2017  PT End of Session - 11/20/17 0821    Visit Number  1    Number of Visits  24    Date for PT Re-Evaluation  01/15/18    PT Start Time  0821    PT Stop Time  0847    PT Time Calculation (min)  26 min    Activity Tolerance  Patient tolerated treatment well    Behavior During Therapy  [facility] for tasks assessed/performed       Past Medical History:  Diagnosis Date  . Anemia   . Anxiety    panic attacks  . Arthritis   . Asthma   . Complication of anesthesia    difficulty breathing for a few a minutes after a procedure  . Difficult intubation    pt unsure   . Environmental and seasonal allergies   . Family history of adverse reaction to anesthesia    " my son hard a hard time waking up "  . GERD (gastroesophageal reflux disease)   . Headache   . Helicobacter pylori gastritis   . Hernia, abdominal   . History of kidney stones   . Irregular heartbeat   . Pneumonia   . Recurrent sinus infections   . Type II diabetes mellitus ([place])     Past Surgical History:  Procedure Laterality Date  . APPENDECTOMY    . BIOPSY ENDOMETRIAL    . COLONOSCOPY W/ BIOPSIES AND POLYPECTOMY    . ESOPHAGOGASTRODUODENOSCOPY (EGD) WITH PROPOFOL N/A 11/26/2016   Procedure: ESOPHAGOGASTRODUODENOSCOPY (EGD) WITH PROPOFOL;  Surgeon: Otis Brace, MD;  Location: [facility];  Service: Gastroenterology;  Laterality: N/A;  . ROTATOR CUFF REPAIR      There were no vitals filed for this visit.   Subjective Assessment - 11/20/17 0825    Subjective  Patient had been having intermittent aching in her left shoulder for months. On 10/04/17 patient checked out of store  with full hand basket and had intense L shoulder pain, neck and back.    Pertinent History  DM, anemia, anxiety    Diagnostic tests  MRI L shoulder RC tear; NCV 11/06/17 C8 nerve compression    Currently in Pain?  Yes    Pain Score  3     Pain Location  Shoulder    Pain Orientation  Left    Pain Descriptors / Indicators  Burning    Pain Type  Acute pain    Pain Radiating Towards  across shoulder blade and into triceps, intermittent numbness in digits 4 and 5    Pain Onset  More than a month ago    Pain Frequency  Constant    Aggravating Factors   unsure    Pain Relieving Factors  popping neck    Effect of Pain on Daily Activities  works customer care downstairs    Multiple Pain Sites  Yes    Pain Score  3    Pain Location  Neck    Pain Orientation  Left    Pain Descriptors / Indicators  -- stiffness    Pain Type  Acute pain    Pain Onset  More than a month ago    Pain Relieving Factors  popping neck         [facility] PT Assessment - 11/20/17 0001      Assessment   Medical Diagnosis  L cervical radiculopathy,     Referring Provider  Carolin Sicks MD    Onset Date/Surgical Date  08/20/17    Hand Dominance  Right      Precautions   Precautions  None      Balance Screen   Has the patient fallen in the past 6 months  No    Has the patient had a decrease in activity level because of a fear of falling?   No    Is the patient reluctant to leave their home because of a fear of falling?   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      Observation/Other Assessments   Focus on Therapeutic Outcomes (FOTO)   Patient 20 min late for appt so unable to complete      Posture/Postural Control   Posture/Postural Control  No significant limitations      ROM / Strength   AROM / PROM / Strength  AROM;PROM;Strength      AROM   Overall AROM Comments  Cervical flex 15 deg; L  rot 45 deg; else full ROM    AROM Assessment Site  Shoulder    Right/Left Shoulder  Left    Left Shoulder Extension  -- WNl    Left Shoulder Flexion  90 Degrees    Left Shoulder ABduction  80 Degrees    Left Shoulder Internal Rotation  6 Degrees    Left Shoulder External Rotation  54 Degrees      PROM   PROM Assessment Site  Shoulder    Right/Left Shoulder  Left    Left Shoulder Flexion  168 Degrees    Left Shoulder Internal Rotation  8 Degrees    Left Shoulder External Rotation  64 Degrees      Strength   Overall Strength Comments  weak L grip strength (squeezing PT hand); wrist and elbow 5/5    Strength Assessment Site  Shoulder    Right/Left Shoulder  Left    Left Shoulder Flexion  3/5    Left Shoulder Extension  5/5    Left Shoulder ABduction  2+/5    Left Shoulder Internal Rotation  5/5    Left Shoulder External Rotation  5/5      Palpation   Palpation comment  marked tenderness of L UT, levator scap, supra and infraspinatus, pectorals, cerv and mid thoracic paraspinals      Special Tests   Other special tests  neg compression/distraction                Objective measurements completed on examination: See above findings.                PT Short Term Goals - 11/20/17 1419      PT SHORT TERM GOAL #1   Title  I with initial HEP    Time  4    Period  Weeks    Status  New    Target Date  12/18/17      PT SHORT TERM GOAL #2   Title  Patient to report decreased neck pain by 50% or more with ADLs    Time  4    Period  Weeks    Status  New      PT SHORT TERM GOAL #3   Title  Patient to demo improved R shoulder flex/ABD to 120 deg or better to improve function.    Time  4    Period  Weeks    Status  New      PT SHORT TERM GOAL #4   Title  Patient to report improved grip strength with ADLS (eg. holding coffee mug) by 50% or more.    Time  4    Period  Weeks    Status  New        PT Long Term Goals - 11/20/17 1420      PT LONG TERM  GOAL #1   Title  I with advanced HEP    Time  8    Period  Weeks    Status  New    Target Date  01/15/18      PT LONG TERM GOAL #2   Title  Patient to demo L cervical Rot WFL and with minimal pain.    Time  8    Period  Weeks    Status  New      PT LONG TERM GOAL #3   Title  Patient to demo improved L shoulder flex/ABD to 165 to normalize ADLS    Time  8    Period  Weeks    Status  New      PT LONG TERM GOAL #4   Title  Patient able to reach behind back with L hand to L4 to aide dressing.    Time  8    Period  Weeks    Status  New      PT LONG TERM GOAL #5   Title  Patient to demo improved L shoulder strength to 4+/5 or better to ease ADLS.    Time  8    Period  Weeks    Status  New             Plan - 11/20/17 4098    Clinical Impression Statement  Patient presents for a moderate complexity evaluation for cervical radiculopathy, L shoulder pain due to RCT and low back pain. PT was unable to assess low back today due to pt being 20 min late. She has limited ROM in the neck and left shoulder as well as weakness in the shoulder and L hand. She had a NCV test which showed a pinched nerve and an MRI which shows a tear. Patient has difficulty with ADLS due to these limitations and will benefit from skilled PT to address them.    History and Personal Factors relevant to plan of care:  DM, anemia, R RCR, anxiety    Clinical Presentation  Evolving    Clinical Presentation due to:  worsening symptoms    Clinical Decision Making  Moderate    Rehab Potential  Good    PT Frequency  3x / week    PT Duration  8 weeks    PT Treatment/Interventions  ADLs/Self Care Home Management;Cryotherapy;Electrical Stimulation;Moist Heat;Traction;Ultrasound;Neuromuscular re-education;Therapeutic exercise;Patient/family education;Manual techniques;Vasopneumatic Device;Taping;Dry needling;Passive range of motion    PT Next Visit Plan  DO FOTO PLEASE; PT needs to assess low back (no time 1st visit);  AAROM L  shoulder, isometrics for flex/ABD; cervical stab, manual traction, grip strength       Patient will benefit from skilled therapeutic intervention in order to improve the following deficits and impairments:  Decreased range  of motion, Impaired UE functional use, Decreased activity tolerance, Pain, Impaired sensation, Decreased strength  Visit Diagnosis: Radiculopathy, cervical region - Plan: PT plan of care cert/re-cert  Stiffness of left shoulder, not elsewhere classified - Plan: PT plan of care cert/re-cert  Acute pain of left shoulder - Plan: PT plan of care cert/re-cert  Acute midline low back pain, with sciatica presence unspecified - Plan: PT plan of care cert/re-cert     Problem List Patient Active Problem List   Diagnosis Date Noted  . Type II diabetes mellitus ([place])   . Eczema 04/15/2017  . Uncontrolled type 2 diabetes mellitus with hyperglycemia, without long-term current use of insulin ([place]) 12/13/2016  . Diabetes mellitus type II, uncontrolled ([place]) 12/10/2016  . Morbid obesity with BMI of 50.0-59.9, adult ([place]) 12/10/2016  . Allergic rhinitis 12/10/2016    Vaibhav Fogleman PT 11/20/2017, 3:59 PM  [place], [place], [address] Phone: [phone]   Fax:  [phone]  Name: Tanya Houston MRN: [id number] Date of Birth: 1965/12/16

## 2017-11-22 ENCOUNTER — Ambulatory Visit: Payer: 59 | Admitting: Physical Therapy

## 2017-11-26 ENCOUNTER — Ambulatory Visit: Payer: 59 | Admitting: Physical Therapy

## 2017-11-26 DIAGNOSIS — M5412 Radiculopathy, cervical region: Secondary | ICD-10-CM | POA: Diagnosis not present

## 2017-11-26 DIAGNOSIS — M25612 Stiffness of left shoulder, not elsewhere classified: Secondary | ICD-10-CM

## 2017-11-26 DIAGNOSIS — M25512 Pain in left shoulder: Secondary | ICD-10-CM

## 2017-11-26 NOTE — Therapy (Signed)
[place], [place], [address] Phone: [phone]   Fax:  [phone]  Physical Therapy Treatment  Patient Details  Name: Tanya Houston MRN: [id number] Date of Birth: 1966/04/06 Referring Provider: Carolin Sicks MD   Encounter Date: 11/26/2017  PT End of Session - 11/26/17 0845    Visit Number  2    Number of Visits  24    Date for PT Re-Evaluation  01/15/18    PT Start Time  0812    PT Stop Time  0900    PT Time Calculation (min)  48 min       Past Medical History:  Diagnosis Date  . Anemia   . Anxiety    panic attacks  . Arthritis   . Asthma   . Complication of anesthesia    difficulty breathing for a few a minutes after a procedure  . Difficult intubation    pt unsure   . Environmental and seasonal allergies   . Family history of adverse reaction to anesthesia    " my son hard a hard time waking up "  . GERD (gastroesophageal reflux disease)   . Headache   . Helicobacter pylori gastritis   . Hernia, abdominal   . History of kidney stones   . Irregular heartbeat   . Pneumonia   . Recurrent sinus infections   . Type II diabetes mellitus ([place])     Past Surgical History:  Procedure Laterality Date  . APPENDECTOMY    . BIOPSY ENDOMETRIAL    . COLONOSCOPY W/ BIOPSIES AND POLYPECTOMY    . ESOPHAGOGASTRODUODENOSCOPY (EGD) WITH PROPOFOL N/A 11/26/2016   Procedure: ESOPHAGOGASTRODUODENOSCOPY (EGD) WITH PROPOFOL;  Surgeon: Otis Brace, MD;  Location: [facility];  Service: Gastroenterology;  Laterality: N/A;  . ROTATOR CUFF REPAIR      There were no vitals filed for this visit.  Subjective Assessment - 11/26/17 0813    Subjective  12 min late. sleeeping is rough    Currently in Pain?  Yes    Pain Score  1     Pain Location  Shoulder    Pain Orientation  Left                       [facility] Adult PT Treatment/Exercise - 11/26/17 0001      Exercises    Exercises  Neck;Shoulder;Lumbar      Neck Exercises: Machines for Strengthening   UBE (Upper Arm Bike)  L 2 2 fwd/ 2 back      Neck Exercises: Standing   Neck Retraction  10 reps;3 secs head on ball on wall    Wall Push Ups  15 reps      Shoulder Exercises: Standing   External Rotation  Strengthening;Both;10 reps;Theraband    Theraband Level (Shoulder External Rotation)  Level 1 (Yellow)    Extension  Both;10 reps;Theraband;Strengthening    Theraband Level (Shoulder Extension)  Level 1 (Yellow)    Row  Strengthening;Both;10 reps;Theraband    Theraband Level (Shoulder Row)  Level 1 (Yellow)    Other Standing Exercises  3# shruggs,backward rolls, ext and ER 10 reps    Other Standing Exercises  rolling ball up/down wall 5 times finger ladder with ecc lowering 5 times flex and abd      Modalities   Modalities  Moist Heat;Electrical Stimulation      Moist Heat Therapy   Number Minutes Moist Heat  15  Minutes    Moist Heat Location  Shoulder      Electrical Stimulation   Electrical Stimulation Location  Left shld    Electrical Stimulation Action  IFC    Electrical Stimulation Parameters  seated    Electrical Stimulation Goals  Pain      Manual Therapy   Manual Therapy  Passive ROM    Manual therapy comments  good PROM but guarded    Passive ROM  Left UE               PT Short Term Goals - 11/20/17 1419      PT SHORT TERM GOAL #1   Title  I with initial HEP    Time  4    Period  Weeks    Status  New    Target Date  12/18/17      PT SHORT TERM GOAL #2   Title  Patient to report decreased neck pain by 50% or more with ADLs    Time  4    Period  Weeks    Status  New      PT SHORT TERM GOAL #3   Title  Patient to demo improved R shoulder flex/ABD to 120 deg or better to improve function.    Time  4    Period  Weeks    Status  New      PT SHORT TERM GOAL #4   Title  Patient to report improved grip strength with ADLS (eg. holding coffee mug) by 50% or more.     Time  4    Period  Weeks    Status  New        PT Long Term Goals - 11/20/17 1420      PT LONG TERM GOAL #1   Title  I with advanced HEP    Time  8    Period  Weeks    Status  New    Target Date  01/15/18      PT LONG TERM GOAL #2   Title  Patient to demo L cervical Rot WFL and with minimal pain.    Time  8    Period  Weeks    Status  New      PT LONG TERM GOAL #3   Title  Patient to demo improved L shoulder flex/ABD to 165 to normalize ADLS    Time  8    Period  Weeks    Status  New      PT LONG TERM GOAL #4   Title  Patient able to reach behind back with L hand to L4 to aide dressing.    Time  8    Period  Weeks    Status  New      PT LONG TERM GOAL #5   Title  Patient to demo improved L shoulder strength to 4+/5 or better to ease ADLS.    Time  8    Period  Weeks    Status  New            Plan - 11/26/17 0845    Clinical Impression Statement  pt was fearful and guarded with all aspects of tx but did well with verb and tactile cuing. stressed importance of being on time to pt    PT Treatment/Interventions  ADLs/Self Care Home Management;Cryotherapy;Electrical Stimulation;Moist Heat;Traction;Ultrasound;Neuromuscular re-education;Therapeutic exercise;Patient/family education;Manual techniques;Vasopneumatic Device;Taping;Dry needling;Passive range of motion    PT Next Visit Plan  DO FOTO  PLEASE; PT needs to assess low back (no time 1st visit); AAROM L  shoulder, isometrics for flex/ABD; cervical stab, manual traction, grip strength       Patient will benefit from skilled therapeutic intervention in order to improve the following deficits and impairments:  Decreased range of motion, Impaired UE functional use, Decreased activity tolerance, Pain, Impaired sensation, Decreased strength  Visit Diagnosis: Radiculopathy, cervical region  Stiffness of left shoulder, not elsewhere classified  Acute pain of left shoulder     Problem List Patient Active  Problem List   Diagnosis Date Noted  . Type II diabetes mellitus ([place])   . Eczema 04/15/2017  . Uncontrolled type 2 diabetes mellitus with hyperglycemia, without long-term current use of insulin ([place]) 12/13/2016  . Diabetes mellitus type II, uncontrolled ([place]) 12/10/2016  . Morbid obesity with BMI of 50.0-59.9, adult ([place]) 12/10/2016  . Allergic rhinitis 12/10/2016    Averey Koning,ANGIE PTA 11/26/2017, 8:50 AM  [place] [address] [place], [place], [address] Phone: [phone]   Fax:  [phone]  Name: Tanya Houston MRN: [id number] Date of Birth: 11-Jan-1966

## 2017-11-29 ENCOUNTER — Ambulatory Visit: Payer: 59 | Admitting: Physical Therapy

## 2017-12-03 ENCOUNTER — Ambulatory Visit: Payer: 59 | Admitting: Physical Therapy

## 2017-12-05 ENCOUNTER — Other Ambulatory Visit: Payer: Self-pay | Admitting: Obstetrics and Gynecology

## 2017-12-05 ENCOUNTER — Ambulatory Visit: Payer: 59 | Admitting: Physical Therapy

## 2017-12-09 NOTE — Telephone Encounter (Signed)
Medication refill request: Hydroxyzine 25 mg  Last AEX:  09/18/17 JJ  Next AEX: 09/22/18  Last MMG (if hormonal medication request): 09/2016 WNL per patient with Dr. Leo Grosser  Refill authorized: 08/29/17 #30, 0RF. Today, please advise.

## 2018-01-02 ENCOUNTER — Ambulatory Visit: Payer: 59 | Admitting: Obstetrics and Gynecology

## 2018-01-16 ENCOUNTER — Other Ambulatory Visit: Payer: Self-pay

## 2018-01-16 ENCOUNTER — Encounter: Payer: Self-pay | Admitting: Obstetrics and Gynecology

## 2018-01-16 ENCOUNTER — Ambulatory Visit: Payer: 59 | Admitting: Obstetrics and Gynecology

## 2018-01-16 VITALS — BP 124/82 | HR 80 | Resp 18 | Wt 314.0 lb

## 2018-01-16 DIAGNOSIS — N763 Subacute and chronic vulvitis: Secondary | ICD-10-CM

## 2018-01-16 DIAGNOSIS — B372 Candidiasis of skin and nail: Secondary | ICD-10-CM | POA: Diagnosis not present

## 2018-01-16 DIAGNOSIS — R350 Frequency of micturition: Secondary | ICD-10-CM

## 2018-01-16 DIAGNOSIS — R3915 Urgency of urination: Secondary | ICD-10-CM | POA: Diagnosis not present

## 2018-01-16 LAB — POCT URINALYSIS DIPSTICK
Bilirubin, UA: NEGATIVE
Glucose, UA: NEGATIVE
Ketones, UA: NEGATIVE
NITRITE UA: NEGATIVE
PROTEIN UA: NEGATIVE
Urobilinogen, UA: 0.2 E.U./dL
pH, UA: 5 (ref 5.0–8.0)

## 2018-01-16 MED ORDER — NITROFURANTOIN MONOHYD MACRO 100 MG PO CAPS: 100.0000 mg | ORAL_CAPSULE | Freq: Two times a day (BID) | ORAL | 0 refills | Status: DC

## 2018-01-16 MED ORDER — TRIAMCINOLONE ACETONIDE 0.5 % EX OINT: TOPICAL_OINTMENT | CUTANEOUS | 1 refills | Status: DC

## 2018-01-16 MED ORDER — NYSTATIN 100000 UNIT/GM EX OINT: TOPICAL_OINTMENT | CUTANEOUS | 1 refills | Status: DC

## 2018-01-16 MED ORDER — NYSTATIN 100000 UNIT/GM EX CREA
1.0000 | TOPICAL_CREAM | Freq: Two times a day (BID) | CUTANEOUS | 1 refills | Status: DC
Start: 2018-01-16 — End: 2019-12-09

## 2018-01-16 NOTE — Progress Notes (Signed)
GYNECOLOGY  VISIT   HPI: 52 y.o.   Married  [place] American  female   [phone] with Patient's last menstrual period was 12/22/2017.   here c/o urinary frequency and lower back pain X 1 month. Urinary urgency, voiding small amounts. No dysuria. She took one Cipro tablet on Sunday. She is also c/o vulvar and sub panus  itching long term. Gets aggravated when she uses vagifem, from the moisture.  She hasn't had intercourse in a year secondary to painful intercourse in the past. Her husband doesn't want to use a lubricant.      GYNECOLOGIC HISTORY: Patient's last menstrual period was 12/22/2017. Contraception:none  Menopausal hormone therapy: none         OB History    Gravida  3   Para  2   Term  2   Preterm      AB  1   Living  2     SAB      TAB      Ectopic      Multiple      Live Births  2              Patient Active Problem List   Diagnosis Date Noted  . Type II diabetes mellitus ([place])   . Eczema 04/15/2017  . Uncontrolled type 2 diabetes mellitus with hyperglycemia, without long-term current use of insulin ([place]) 12/13/2016  . Diabetes mellitus type II, uncontrolled ([place]) 12/10/2016  . Morbid obesity with BMI of 50.0-59.9, adult ([place]) 12/10/2016  . Allergic rhinitis 12/10/2016    Past Medical History:  Diagnosis Date  . Anemia   . Anxiety    panic attacks  . Arthritis   . Asthma   . Complication of anesthesia    difficulty breathing for a few a minutes after a procedure  . Difficult intubation    pt unsure   . Environmental and seasonal allergies   . Family history of adverse reaction to anesthesia    " my son hard a hard time waking up "  . GERD (gastroesophageal reflux disease)   . Headache   . Helicobacter pylori gastritis   . Hernia, abdominal   . History of kidney stones   . Irregular heartbeat   . Pneumonia   . Recurrent sinus infections   . Type II diabetes mellitus ([place])     Past Surgical History:  Procedure Laterality Date  .  APPENDECTOMY    . BIOPSY ENDOMETRIAL    . COLONOSCOPY W/ BIOPSIES AND POLYPECTOMY    . ESOPHAGOGASTRODUODENOSCOPY (EGD) WITH PROPOFOL N/A 11/26/2016   Procedure: ESOPHAGOGASTRODUODENOSCOPY (EGD) WITH PROPOFOL;  Surgeon: Otis Brace, MD;  Location: [facility];  Service: Gastroenterology;  Laterality: N/A;  . ROTATOR CUFF REPAIR      Current Outpatient Medications  Medication Sig Dispense Refill  . cyclobenzaprine (FLEXERIL) 10 MG tablet Take 10 mg by mouth 3 (three) times daily as needed for muscle spasms.     . Estradiol 10 MCG TABS vaginal tablet Place 1 tablet (10 mcg total) vaginally 2 (two) times a week. 24 tablet 3  . hydrocortisone (ANUSOL-HC) 25 MG suppository Place 25 mg rectally 2 (two) times daily as needed for hemorrhoids.     Marland Kitchen ibuprofen (ADVIL,MOTRIN) 800 MG tablet Take 1 tablet (800 mg total) by mouth every 8 (eight) hours as needed. 30 tablet 1  . nystatin cream (MYCOSTATIN) Apply 1 application topically 2 (two) times daily.     Marland Kitchen nystatin ointment (MYCOSTATIN) APPLY  OINTMENT  TOPICALLY TO AFFECTED AREA TWICE DAILY FOR 7 DAYS 30 g 1  . pantoprazole (PROTONIX) 40 MG tablet Take 40 mg by mouth daily as needed (heartburn).     Marland Kitchen PROCTOZONE-HC 2.5 % rectal cream USE TWICE DAILY FOR 1 WEEK, THEN ONCE A DAY FOR 1 WEEK , THEN EVERY OTHER DAY FOR ONE WEEK 28 g 0   No current facility-administered medications for this visit.      ALLERGIES: Adhesive [tape]; Fish allergy; Latex; Peanut oil; and Sulfa antibiotics  Family History  Problem Relation Age of Onset  . Diabetes Mother   . Heart attack Mother   . Colon cancer Father   . Cervical cancer Sister   . Diabetes Sister   . Heart attack Sister   . Epilepsy Brother     Social History   Socioeconomic History  . Marital status: Married    Spouse name: Not on file  . Number of children: Not on file  . Years of education: Not on file  . Highest education level: Not on file  Occupational History  . Not on file  Social  Needs  . Financial resource strain: Not on file  . Food insecurity:    Worry: Not on file    Inability: Not on file  . Transportation needs:    Medical: Not on file    Non-medical: Not on file  Tobacco Use  . Smoking status: Never Smoker  . Smokeless tobacco: Never Used  Substance and Sexual Activity  . Alcohol use: Yes    Comment: occ  . Drug use: No  . Sexual activity: Not Currently    Partners: Male    Birth control/protection: None  Lifestyle  . Physical activity:    Days per week: Not on file    Minutes per session: Not on file  . Stress: Not on file  Relationships  . Social connections:    Talks on phone: Not on file    Gets together: Not on file    Attends religious service: Not on file    Active member of club or organization: Not on file    Attends meetings of clubs or organizations: Not on file    Relationship status: Not on file  . Intimate partner violence:    Fear of current or ex partner: Not on file    Emotionally abused: Not on file    Physically abused: Not on file    Forced sexual activity: Not on file  Other Topics Concern  . Not on file  Social History Narrative  . Not on file    Review of Systems  Constitutional:       Weight gain  HENT: Negative.   Eyes: Negative.   Respiratory: Negative.   Cardiovascular: Positive for leg swelling.  Gastrointestinal: Positive for diarrhea.       Bloating  Genitourinary: Positive for frequency.       Vaginal itching Painful intercourse Irregular menstrual cycles   Musculoskeletal: Positive for back pain.  Skin: Negative.   Neurological: Negative.   Endo/Heme/Allergies: Negative.   Psychiatric/Behavioral: Negative.     PHYSICAL EXAMINATION:    BP 124/82 (BP Location: Right Arm, Patient Position: Sitting, Cuff Size: Large)   Pulse 80   Resp 18   Wt (!) 314 lb (142.4 kg)   LMP 12/22/2017   BMI 51.46 kg/m     General appearance: alert, cooperative and appears stated age Abdomen: soft,  non-tender; non distended, no masses,  no organomegaly Skin: irregular  pigmented rash with some fissures under her panus and in the right groin. C/W candida intertrigo.   Pelvic: External genitalia:  no lesions, rash lateral to the right vulva with fissures and mild whitening.               Urethra:  normal appearing urethra with no masses, tenderness or lesions              Bartholins and Skenes: normal                 Vagina: normal appearing vagina with normal color and discharge, no lesions. Able to insert 2 fingers without discomfort.               Cervix: no lesions              Bimanual Exam:  Uterus:  normal size, contour, position, consistency, mobility, non-tender              Adnexa: no mass, fullness, tenderness              Bladder: tender  Chaperone was present for exam.  ASSESSMENT Urinary frequency, urgency, bladder tenderness Vulvitis Candida intertrigo     PLAN Affirm sent CCUA for ua, c&s Treat with macrobid Steroid cream and nystatin scripts refilled Reviewed that the steroid in not for daily use    An After Visit Summary was printed and given to the patient.

## 2018-01-17 LAB — URINALYSIS, MICROSCOPIC ONLY
BACTERIA UA: NONE SEEN
Casts: NONE SEEN /lpf
Epithelial Cells (non renal): >10 /hpf — AB (ref 0–10)

## 2018-01-17 LAB — VAGINITIS/VAGINOSIS, DNA PROBE
CANDIDA SPECIES: NEGATIVE
Gardnerella vaginalis: NEGATIVE
Trichomonas vaginosis: NEGATIVE

## 2018-01-18 LAB — URINE CULTURE: ORGANISM ID, BACTERIA: NO GROWTH

## 2018-01-21 ENCOUNTER — Telehealth: Payer: Self-pay

## 2018-01-21 DIAGNOSIS — Z7689 Persons encountering health services in other specified circumstances: Secondary | ICD-10-CM

## 2018-01-21 NOTE — Telephone Encounter (Signed)
Left message to call Kaitlyn at [phone]. 

## 2018-01-21 NOTE — Telephone Encounter (Signed)
-----   Message from Salvadore Dom, MD sent at 01/16/2018  2:54 PM EDT ----- She needs a good dermatologist, didn't have luck here in [place]. Willing to travel, wants "the best" Thanks, Sharee Pimple

## 2018-01-21 NOTE — Telephone Encounter (Signed)
Spoke with patient. Patient states that she has been to see Dr.Black  at [facility] dermatology and to [facility]. Would like referral to another location. Referral placed to [place] with [facility]. Patient is aware she will be contacted to schedule appointment.  Patient states she is having ongoing urinary frequency and lower back pain. States this feels just like when she has had UTIs in the past. Reports she was treated many times in [place]. States Macrobid has not relieved symptoms. "Only Cirpo helps." Urine culture was negative. Feels this is due to her starting left over Cirpo before seeing Dr.Jertson. Requesting rx for Cipro.

## 2018-01-21 NOTE — Telephone Encounter (Signed)
She will need to repeat the culture, if she truly has an infection it should show up, given her limited use of Cipro. Cipro should not be taken with clear cause. She can see Urology if she is having recurring symptoms.

## 2018-01-21 NOTE — Telephone Encounter (Signed)
Attempted to reach patient at number provided [phone], there was no answer and recording states that the voicemail box is full.

## 2018-01-22 NOTE — Telephone Encounter (Signed)
Patient is returning a call to Kaitlyn. °

## 2018-01-22 NOTE — Telephone Encounter (Signed)
Spoke with patient. Advised of message as seen below from [place]. Patient is agreeable to return for urine culture. Took Macrobid yesterday has not taken today. Advised discontinue Macrobid so that this does not alter urine culture. Appointment for urine culture scheduled for 01/24/2018 at 2:30 pm. Patient is agreeable to date and time.  Routing to [place] for review of scheduling. This will be day 3 off antibiotics. Patient does not want to wait over the weekend.

## 2018-01-24 ENCOUNTER — Telehealth: Payer: Self-pay | Admitting: Obstetrics and Gynecology

## 2018-01-24 ENCOUNTER — Ambulatory Visit (INDEPENDENT_AMBULATORY_CARE_PROVIDER_SITE_OTHER): Payer: 59

## 2018-01-24 VITALS — BP 122/84 | HR 70 | Temp 98.1°F | Resp 16 | Ht 65.5 in | Wt 312.0 lb

## 2018-01-24 DIAGNOSIS — N390 Urinary tract infection, site not specified: Secondary | ICD-10-CM | POA: Diagnosis not present

## 2018-01-24 LAB — POCT URINALYSIS DIPSTICK
Bilirubin, UA: NEGATIVE
GLUCOSE UA: NEGATIVE
Ketones, UA: NEGATIVE
LEUKOCYTES UA: NEGATIVE
NITRITE UA: NEGATIVE
PROTEIN UA: NEGATIVE
RBC UA: NEGATIVE
Urobilinogen, UA: NEGATIVE E.U./dL — AB
pH, UA: 5 (ref 5.0–8.0)

## 2018-01-24 NOTE — Telephone Encounter (Signed)
Call to patient unable to leave message voicemail was full.

## 2018-01-24 NOTE — Progress Notes (Signed)
Per telephone encounter & Sumner Boast, MD, pt is here for urine culture. Pt c/o abdominal discomfort, frequency & back pain. Per Dr Talbert Nan, pt advised to increase water & to take azo to help with symptoms until urine culture comes in. Pt aware to be seen in urgent care or the ER if symptoms increase.

## 2018-01-25 LAB — URINE CULTURE: Organism ID, Bacteria: NO GROWTH

## 2018-01-27 ENCOUNTER — Telehealth: Payer: Self-pay | Admitting: *Deleted

## 2018-01-27 NOTE — Telephone Encounter (Signed)
Left message to call back regarding lab results -eh

## 2018-01-27 NOTE — Telephone Encounter (Signed)
Patient returning Elaine's call. °

## 2018-01-27 NOTE — Telephone Encounter (Signed)
-----   Message from Salvadore Dom, MD sent at 01/27/2018 11:12 AM EDT ----- Please inform the patient that her urine culture is negative for infection. If she is still having bladder symptoms, please place a referral to Urology.

## 2018-01-27 NOTE — Telephone Encounter (Signed)
Spoke with patient and gave results. Patient is going to call her Urologist and schedule an appointment -eh

## 2018-02-14 ENCOUNTER — Encounter: Payer: Self-pay | Admitting: Family Medicine

## 2018-02-14 ENCOUNTER — Ambulatory Visit: Payer: 59 | Admitting: Family Medicine

## 2018-02-14 VITALS — BP 124/80 | HR 91 | Temp 98.7°F | Resp 12 | Ht 65.5 in | Wt 312.1 lb

## 2018-02-14 DIAGNOSIS — E1165 Type 2 diabetes mellitus with hyperglycemia: Secondary | ICD-10-CM | POA: Diagnosis not present

## 2018-02-14 DIAGNOSIS — L304 Erythema intertrigo: Secondary | ICD-10-CM

## 2018-02-14 DIAGNOSIS — L309 Dermatitis, unspecified: Secondary | ICD-10-CM

## 2018-02-14 DIAGNOSIS — Z6841 Body Mass Index (BMI) 40.0 and over, adult: Secondary | ICD-10-CM

## 2018-02-14 LAB — BASIC METABOLIC PANEL
BUN: 9 mg/dL (ref 6–23)
CO2: 27 meq/L (ref 19–32)
Calcium: 9.3 mg/dL (ref 8.4–10.5)
Chloride: 104 mEq/L (ref 96–112)
Creatinine, Ser: 0.73 mg/dL (ref 0.40–1.20)
GFR: 108.11 mL/min (ref >60.00)
GLUCOSE: 129 mg/dL — AB (ref 70–99)
POTASSIUM: 4.3 meq/L (ref 3.5–5.1)
SODIUM: 139 meq/L (ref 135–145)

## 2018-02-14 LAB — HEMOGLOBIN A1C: Hgb A1c MFr Bld: 7.9 % — ABNORMAL HIGH (ref 4.6–6.5)

## 2018-02-14 LAB — MICROALBUMIN / CREATININE URINE RATIO
CREATININE, U: 92.5 mg/dL
MICROALB UR: 3.2 mg/dL — AB (ref 0.0–1.9)
MICROALB/CREAT RATIO: 3.4 mg/g (ref 0.0–30.0)

## 2018-02-14 MED ORDER — HYDROXYZINE HCL 25 MG PO TABS: 25.0000 mg | ORAL_TABLET | Freq: Three times a day (TID) | ORAL | 1 refills | Status: DC | PRN

## 2018-02-14 MED ORDER — TRIAMCINOLONE ACETONIDE 0.5 % EX OINT: TOPICAL_OINTMENT | CUTANEOUS | 1 refills | Status: DC

## 2018-02-14 MED ORDER — NYSTATIN 100000 UNIT/GM EX POWD
Freq: Three times a day (TID) | CUTANEOUS | 0 refills | Status: DC | PRN
Start: 2018-02-14 — End: 2018-07-22

## 2018-02-14 MED ORDER — DOXEPIN HCL 10 MG PO CAPS: 10.0000 mg | ORAL_CAPSULE | Freq: Every evening | ORAL | 1 refills | Status: DC | PRN

## 2018-02-14 NOTE — Assessment & Plan Note (Signed)
Currently she is on nonpharmacologic treatment, she did not feel like metformin was helping to control problem. Recommendations in regard to pharmacologic treatment will be given according to Hg A1c report.  We can consider GLP-1 inhibitors. Regular exercise and healthy diet with avoidance of added sugar food intake is an important part of treatment and recommended. Annual eye exam, periodic dental and foot care recommended. F/U in 5-6 months

## 2018-02-14 NOTE — Assessment & Plan Note (Signed)
We discussed benefits of wt loss as well as adverse effects of obesity. Consistency with healthy diet and physical activity recommended.  

## 2018-02-14 NOTE — Progress Notes (Signed)
ACUTE VISIT   HPI:  Chief Complaint  Patient presents with  . Skin irritaion    itching all over and doesn't have dermatology appointment until October    [name] is a 52 y.o. female, who is here today complaining of generalized pruritus, "from head to toe." This problem has been going on for years but seems to be getting worse. She is reporting "hives", mildly raised, erythematosus, and constant rash.  Localized on the anterior aspect of elbows, posterior aspect of Ms., around neck, upper and lower back, groin, "genital area ", and lower abdomen. Denies vaginal discharge or bleeding. She has not identified exacerbating or alleviating factors. Problem is interfering with her sleep.  Hx of eczema and follows with dermatologist, next appointment in 05/2018. According to patient, during her last dermatology appointment she had biopsy done and betamethasone cream was recommended.  Topical steroid is not helping. She states that she does not believe her symptoms are caused by eczema.  She has tried OTC Zyrtec 10 mg at night and Allegra 180 mg in the morning, neither one has helped.  Negative for fever, chills, sick contacts, or recent travel.  In the past she has used triamcinolone cream, which did help.  Hx of allergic rhinitis.  Diabetes Mellitus II:   She also would like to follow on DM 2, last follow-up was on 04/15/2017.  Currently on nonpharmacologic treatment. She discontinued metformin because BS readings were getting worse.  Checking BS's : 105-120.  Last eye exam in 07/2017.  She denies abdominal pain, nausea, vomiting, polydipsia, polyuria, or polyphagia. No numbness, tingling, or burning.     Lab Results  Component Value Date   CREATININE 0.79 10/07/2017   BUN 9 10/07/2017   NA 138 10/07/2017   K 3.5 10/07/2017   CL 105 10/07/2017   CO2 23 10/07/2017    Lab Results  Component Value Date   HGBA1C 7.0 (H) 05/17/2017   Lab Results    Component Value Date   MICROALBUR 1.8 12/10/2016     She is not exercising regularly. She has not been consistent with a healthy diet. She is reporting stable weight.   Review of Systems  Constitutional: Positive for fatigue. Negative for activity change, appetite change and fever.  HENT: Negative for mouth sores, nosebleeds and trouble swallowing.   Eyes: Negative for redness and visual disturbance.  Respiratory: Negative for cough, shortness of breath and wheezing.   Cardiovascular: Negative for chest pain, palpitations and leg swelling.  Gastrointestinal: Negative for abdominal pain, nausea and vomiting.       Negative for changes in bowel habits.  Endocrine: Negative for polydipsia, polyphagia and polyuria.  Genitourinary: Negative for decreased urine volume and hematuria.  Musculoskeletal: Negative for gait problem and myalgias.  Skin: Positive for rash and wound.  Allergic/Immunologic: Positive for environmental allergies.  Neurological: Negative for syncope, weakness, numbness and headaches.  Psychiatric/Behavioral: Positive for sleep disturbance. Negative for confusion. The patient is nervous/anxious.       Current Outpatient Medications on File Prior to Visit  Medication Sig Dispense Refill  . Estradiol 10 MCG TABS vaginal tablet Place 1 tablet (10 mcg total) vaginally 2 (two) times a week. 24 tablet 3  . hydrocortisone (ANUSOL-HC) 25 MG suppository Place 25 mg rectally 2 (two) times daily as needed for hemorrhoids.     Marland Kitchen ibuprofen (ADVIL,MOTRIN) 800 MG tablet Take 1 tablet (800 mg total) by mouth every 8 (eight) hours as needed. 30 tablet 1  .  nystatin cream (MYCOSTATIN) Apply 1 application topically 2 (two) times daily. 30 g 1  . pantoprazole (PROTONIX) 40 MG tablet Take 40 mg by mouth daily as needed (heartburn).     Marland Kitchen PROCTOZONE-HC 2.5 % rectal cream USE TWICE DAILY FOR 1 WEEK, THEN ONCE A DAY FOR 1 WEEK , THEN EVERY OTHER DAY FOR ONE WEEK 28 g 0   No current  facility-administered medications on file prior to visit.      Past Medical History:  Diagnosis Date  . Anemia   . Anxiety    panic attacks  . Arthritis   . Asthma   . Complication of anesthesia    difficulty breathing for a few a minutes after a procedure  . Difficult intubation    pt unsure   . Environmental and seasonal allergies   . Family history of adverse reaction to anesthesia    " my son hard a hard time waking up "  . GERD (gastroesophageal reflux disease)   . Headache   . Helicobacter pylori gastritis   . Hernia, abdominal   . History of kidney stones   . Irregular heartbeat   . Pneumonia   . Recurrent sinus infections   . Type II diabetes mellitus (HCC)    Allergies  Allergen Reactions  . Adhesive [Tape] Hives    And Band Aids  . Fish Allergy Shortness Of Breath and Itching  . Latex Hives  . Peanut Oil   . Sulfa Antibiotics Hives    Social History   Socioeconomic History  . Marital status: Married    Spouse name: Not on file  . Number of children: Not on file  . Years of education: Not on file  . Highest education level: Not on file  Occupational History  . Not on file  Social Needs  . Financial resource strain: Not on file  . Food insecurity:    Worry: Not on file    Inability: Not on file  . Transportation needs:    Medical: Not on file    Non-medical: Not on file  Tobacco Use  . Smoking status: Never Smoker  . Smokeless tobacco: Never Used  Substance and Sexual Activity  . Alcohol use: Yes    Comment: occ  . Drug use: No  . Sexual activity: Not Currently    Partners: Male    Birth control/protection: None  Lifestyle  . Physical activity:    Days per week: Not on file    Minutes per session: Not on file  . Stress: Not on file  Relationships  . Social connections:    Talks on phone: Not on file    Gets together: Not on file    Attends religious service: Not on file    Active member of club or organization: Not on file     Attends meetings of clubs or organizations: Not on file    Relationship status: Not on file  Other Topics Concern  . Not on file  Social History Narrative  . Not on file    Vitals:   02/14/18 0746  BP: 124/80  Pulse: 91  Resp: 12  Temp: 98.7 F (37.1 C)  SpO2: 97%   Body mass index is 51.15 kg/m.    Physical Exam  Nursing note and vitals reviewed. Constitutional: She is oriented to person, place, and time. She appears well-developed. No distress.  HENT:  Head: Normocephalic and atraumatic.  Mouth/Throat: Oropharynx is clear and moist and mucous membranes are normal.  Eyes: Pupils are equal, round, and reactive to light. Conjunctivae are normal.  Cardiovascular: Normal rate and regular rhythm.  No murmur heard. Pulses:      Dorsalis pedis pulses are 2+ on the right side, and 2+ on the left side.  Respiratory: Effort normal and breath sounds normal. No respiratory distress.  GI: Soft. She exhibits no mass. There is no hepatomegaly. There is no tenderness.  Musculoskeletal: She exhibits edema (1+ pitting LE edema,bilateral.). She exhibits no tenderness.  Lymphadenopathy:    She has no cervical adenopathy.  Neurological: She is alert and oriented to person, place, and time. She has normal strength. Gait normal.  Skin: Skin is warm. Rash noted. Rash is maculopapular. No erythema.     Maculopapular erythematous rash localized on anterior aspect of the elbows, left side of her neck, posterior aspect of knees, and lesions scattered on upper and lower back.  There are some signs of scratching, superficial excoriations.  There mild hyperpigmentationis on lower abdomen, on the right breast, and inguinal area.  Psychiatric: Her mood appears anxious.  Well groomed, good eye contact.    Diabetic Foot Exam - Simple   Simple Foot Form Diabetic Foot exam was performed with the following findings:  Yes 02/14/2018  8:17 AM  Visual Inspection No deformities, no ulcerations, no other  skin breakdown bilaterally:  Yes Sensation Testing Intact to touch and monofilament testing bilaterally:  Yes Pulse Check Comments      ASSESSMENT AND PLAN:   Ms. Treana was seen today for skin irritaion.  Orders Placed This Encounter  Procedures  . Basic metabolic panel  . Hemoglobin A1c  . Fructosamine  . Microalbumin / creatinine urine ratio    Lab Results  Component Value Date   HGBA1C 7.9 (H) 02/14/2018   Lab Results  Component Value Date   CREATININE 0.73 02/14/2018   BUN 9 02/14/2018   NA 139 02/14/2018   K 4.3 02/14/2018   CL 104 02/14/2018   CO2 27 02/14/2018   Lab Results  Component Value Date   MICROALBUR 3.2 (H) 02/14/2018    Intertrigo  Rash she is reporting on lower abdomen, groin, and under her breast are most likely related to intertrigo. Today I do not appreciate a rash on those areas, she has postinflammatory hyperpigmentation changes. Recommend nystatin powder, antibacterial soap, and try to keep area dry. Elevated diabetes control may also help with problem. Continue to follow with dermatologist.   Uncontrolled type 2 diabetes mellitus with hyperglycemia, without long-term current use of insulin ([place]) Currently she is on nonpharmacologic treatment, she did not feel like metformin was helping to control problem. Recommendations in regard to pharmacologic treatment will be given according to Hg A1c report.  We can consider GLP-1 inhibitors. Regular exercise and healthy diet with avoidance of added sugar food intake is an important part of treatment and recommended. Annual eye exam, periodic dental and foot care recommended. F/U in 5-6 months   Morbid obesity with BMI of 50.0-59.9, adult ([place]) We discussed benefits of wt loss as well as adverse effects of obesity. Consistency with healthy diet and physical activity recommended. .   Eczema Topical triamcinolone 0.5 mg twice daily as needed recommended. We discussed some side effects  of chronic use of topical steroid. Pat to dry. Eucerin or Cetaphil daily as needed. She has an appointment with her dermatologist in 05/2018.      Return in about 5 months (around 07/17/2018) for DM.      Malka So  [place], [place]. [place] office.

## 2018-02-14 NOTE — Assessment & Plan Note (Signed)
Topical triamcinolone 0.5 mg twice daily as needed recommended. We discussed some side effects of chronic use of topical steroid. Pat to dry. Eucerin or Cetaphil daily as needed. She has an appointment with her dermatologist in 05/2018.

## 2018-02-14 NOTE — Patient Instructions (Addendum)
A few things to remember from today's visit:   Eczema, unspecified type - Plan: hydrOXYzine (ATARAX/VISTARIL) 25 MG tablet, doxepin (SINEQUAN) 10 MG capsule  Uncontrolled type 2 diabetes mellitus with hyperglycemia, without long-term current use of insulin (HCC) - Plan: Basic metabolic panel, Hemoglobin A1c, Fructosamine, Microalbumin / creatinine urine ratio  Intertrigo - Plan: triamcinolone ointment (KENALOG) 0.5 %, nystatin (MYCOSTATIN/NYSTOP) powder Pepcid 20 mg twice daily may also help with itching. Over-the-counter moisturizer: Eucerin or Cetaphil    Please be sure medication list is accurate. If a new problem present, please set up appointment sooner than planned today.

## 2018-02-15 ENCOUNTER — Encounter: Payer: Self-pay | Admitting: Family Medicine

## 2018-02-17 LAB — FRUCTOSAMINE: FRUCTOSAMINE: 240 umol/L (ref 190–270)

## 2018-02-19 ENCOUNTER — Other Ambulatory Visit: Payer: Self-pay | Admitting: *Deleted

## 2018-02-19 DIAGNOSIS — E1165 Type 2 diabetes mellitus with hyperglycemia: Secondary | ICD-10-CM

## 2018-03-06 DIAGNOSIS — L304 Erythema intertrigo: Secondary | ICD-10-CM | POA: Diagnosis not present

## 2018-03-06 DIAGNOSIS — L2089 Other atopic dermatitis: Secondary | ICD-10-CM | POA: Diagnosis not present

## 2018-03-19 ENCOUNTER — Ambulatory Visit: Payer: 59 | Admitting: *Deleted

## 2018-03-23 DIAGNOSIS — N342 Other urethritis: Secondary | ICD-10-CM | POA: Diagnosis not present

## 2018-03-23 DIAGNOSIS — E1140 Type 2 diabetes mellitus with diabetic neuropathy, unspecified: Secondary | ICD-10-CM | POA: Diagnosis not present

## 2018-03-27 ENCOUNTER — Ambulatory Visit: Payer: 59 | Admitting: Family Medicine

## 2018-03-27 ENCOUNTER — Ambulatory Visit: Payer: 59 | Admitting: Adult Health

## 2018-03-27 ENCOUNTER — Encounter: Payer: Self-pay | Admitting: Adult Health

## 2018-03-27 VITALS — BP 124/84 | Temp 98.0°F | Wt 314.0 lb

## 2018-03-27 DIAGNOSIS — H6983 Other specified disorders of Eustachian tube, bilateral: Secondary | ICD-10-CM | POA: Diagnosis not present

## 2018-03-27 DIAGNOSIS — R102 Pelvic and perineal pain: Secondary | ICD-10-CM | POA: Diagnosis not present

## 2018-03-27 LAB — POC URINALSYSI DIPSTICK (AUTOMATED)
BILIRUBIN UA: NEGATIVE
Blood, UA: NEGATIVE
GLUCOSE UA: NEGATIVE
Ketones, UA: NEGATIVE
LEUKOCYTES UA: NEGATIVE
Nitrite, UA: NEGATIVE
Protein, UA: NEGATIVE
Spec Grav, UA: 1.015 (ref 1.010–1.025)
Urobilinogen, UA: 0.2 E.U./dL
pH, UA: 8 (ref 5.0–8.0)

## 2018-03-27 NOTE — Progress Notes (Signed)
Subjective:    Patient ID: Tanya Houston, female    DOB: 08-04-1966, 52 y.o.   MRN: [id number]  HPI  51 year old female who  has a past medical history of Anemia, Anxiety, Arthritis, Asthma, Complication of anesthesia, Difficult intubation, Environmental and seasonal allergies, Family history of adverse reaction to anesthesia, GERD (gastroesophageal reflux disease), Headache, Helicobacter pylori gastritis, Hernia, abdominal, History of kidney stones, Irregular heartbeat, Pneumonia, Recurrent sinus infections, and Type II diabetes mellitus ([place]).  She presents to the office today for an acute complaint. She reports that she was seen at urgent care less than a week ago for UTI like symptoms. Her urine dip was positive and she was started on Macrobid. Since starting macrobid she has been experiencing dizziness and a feeling of ear fullness. She also reports sinus pain that started this morning. She took a sudafed and this relieved her symptoms.   She has taken the prescribed abx for four days but continues to have low back pain and pelvic pain.  Any dysuria, hematuria, frequency, urgency.  Patient she usually does not have the symptoms with UTIs.  Does report that urgent care did a culture but she has not gotten results yet.  Review of Systems See HPI   Past Medical History:  Diagnosis Date  . Anemia   . Anxiety    panic attacks  . Arthritis   . Asthma   . Complication of anesthesia    difficulty breathing for a few a minutes after a procedure  . Difficult intubation    pt unsure   . Environmental and seasonal allergies   . Family history of adverse reaction to anesthesia    " my son hard a hard time waking up "  . GERD (gastroesophageal reflux disease)   . Headache   . Helicobacter pylori gastritis   . Hernia, abdominal   . History of kidney stones   . Irregular heartbeat   . Pneumonia   . Recurrent sinus infections   . Type II diabetes mellitus ([place])     Social History    Socioeconomic History  . Marital status: Married    Spouse name: Not on file  . Number of children: Not on file  . Years of education: Not on file  . Highest education level: Not on file  Occupational History  . Not on file  Social Needs  . Financial resource strain: Not on file  . Food insecurity:    Worry: Not on file    Inability: Not on file  . Transportation needs:    Medical: Not on file    Non-medical: Not on file  Tobacco Use  . Smoking status: Never Smoker  . Smokeless tobacco: Never Used  Substance and Sexual Activity  . Alcohol use: Yes    Comment: occ  . Drug use: No  . Sexual activity: Not Currently    Partners: Male    Birth control/protection: None  Lifestyle  . Physical activity:    Days per week: Not on file    Minutes per session: Not on file  . Stress: Not on file  Relationships  . Social connections:    Talks on phone: Not on file    Gets together: Not on file    Attends religious service: Not on file    Active member of club or organization: Not on file    Attends meetings of clubs or organizations: Not on file    Relationship status: Not on file  .  Intimate partner violence:    Fear of current or ex partner: Not on file    Emotionally abused: Not on file    Physically abused: Not on file    Forced sexual activity: Not on file  Other Topics Concern  . Not on file  Social History Narrative  . Not on file    Past Surgical History:  Procedure Laterality Date  . APPENDECTOMY    . BIOPSY ENDOMETRIAL    . COLONOSCOPY W/ BIOPSIES AND POLYPECTOMY    . ESOPHAGOGASTRODUODENOSCOPY (EGD) WITH PROPOFOL N/A 11/26/2016   Procedure: ESOPHAGOGASTRODUODENOSCOPY (EGD) WITH PROPOFOL;  Surgeon: Otis Brace, MD;  Location: [facility];  Service: Gastroenterology;  Laterality: N/A;  . ROTATOR CUFF REPAIR      Family History  Problem Relation Age of Onset  . Diabetes Mother   . Heart attack Mother   . Colon cancer Father   . Cervical cancer Sister    . Diabetes Sister   . Heart attack Sister   . Epilepsy Brother     Allergies  Allergen Reactions  . Adhesive [Tape] Hives    And Band Aids  . Fish Allergy Shortness Of Breath and Itching  . Latex Hives  . Peanut Oil   . Sulfa Antibiotics Hives    Current Outpatient Medications on File Prior to Visit  Medication Sig Dispense Refill  . doxepin (SINEQUAN) 10 MG capsule Take 1 capsule (10 mg total) by mouth at bedtime as needed. 30 capsule 1  . Estradiol 10 MCG TABS vaginal tablet Place 1 tablet (10 mcg total) vaginally 2 (two) times a week. 24 tablet 3  . hydrocortisone (ANUSOL-HC) 25 MG suppository Place 25 mg rectally 2 (two) times daily as needed for hemorrhoids.     . hydrOXYzine (ATARAX/VISTARIL) 25 MG tablet Take 1 tablet (25 mg total) by mouth 3 (three) times daily as needed for itching. 30 tablet 1  . ibuprofen (ADVIL,MOTRIN) 800 MG tablet Take 1 tablet (800 mg total) by mouth every 8 (eight) hours as needed. 30 tablet 1  . nystatin (MYCOSTATIN/NYSTOP) powder Apply topically 3 (three) times daily as needed. 45 g 0  . nystatin cream (MYCOSTATIN) Apply 1 application topically 2 (two) times daily. 30 g 1  . pantoprazole (PROTONIX) 40 MG tablet Take 40 mg by mouth daily as needed (heartburn).     Marland Kitchen PROCTOZONE-HC 2.5 % rectal cream USE TWICE DAILY FOR 1 WEEK, THEN ONCE A DAY FOR 1 WEEK , THEN EVERY OTHER DAY FOR ONE WEEK 28 g 0  . triamcinolone ointment (KENALOG) 0.5 % APPLY A PEA SIZED AMOUNT OF OINTMENT TOPICALLY TO AFFECTED AREA TWICE DAILY FOR 1 TO 2 WEEKS. **NOT FOR DAILY LONG TERM USE** 15 g 1   No current facility-administered medications on file prior to visit.     BP 124/84   Temp 98 F (36.7 C)   Wt (!) 314 lb (142.4 kg)   BMI 51.46 kg/m       Objective:   Physical Exam  Constitutional: She is oriented to person, place, and time. She appears well-developed and well-nourished. No distress.  HENT:  Head: Normocephalic and atraumatic.  Right Ear: External ear  normal. Tympanic membrane is bulging. Tympanic membrane is not erythematous. A middle ear effusion is present.  Left Ear: External ear normal. Tympanic membrane is bulging. Tympanic membrane is not erythematous. A middle ear effusion is present.  Nose: Nose normal. No mucosal edema or rhinorrhea. Right sinus exhibits no maxillary sinus tenderness and no frontal  sinus tenderness. Left sinus exhibits no maxillary sinus tenderness and no frontal sinus tenderness.  Mouth/Throat: Uvula is midline, oropharynx is clear and moist and mucous membranes are normal. No oropharyngeal exudate.  Eyes: Pupils are equal, round, and reactive to light. Conjunctivae and EOM are normal.  Cardiovascular: Normal rate, regular rhythm, normal heart sounds and intact distal pulses. Exam reveals no gallop and no friction rub.  No murmur heard. Pulmonary/Chest: Effort normal and breath sounds normal. No stridor. No respiratory distress. She has no wheezes. She has no rales. She exhibits no tenderness.  Abdominal: Soft. Normal appearance. There is generalized tenderness and tenderness in the suprapubic area. There is CVA tenderness (left sided).  Neurological: She is alert and oriented to person, place, and time.  Skin: She is not diaphoretic.  Nursing note and vitals reviewed.     Assessment & Plan:  1. Eustachian tube dysfunction, bilateral -I do not think the complaint of dizziness and bilateral ear fullness is due to antibiotics.  There are no signs of sinusitis.  Advised that she can use Flonase or Sudafed for another couple of days.  If no improvement please follow-up  2. Pelvic pain -Upon review of previous urine cultures she is resistant to multiple antibiotics.  Advised to continue taking Macrobid for the time being.  Will send for urine culture and treat appropriately - POCT Urinalysis Dipstick (Automated) - Culture, Urine  Dorothyann Peng, NP

## 2018-03-28 ENCOUNTER — Ambulatory Visit: Payer: 59 | Admitting: Family Medicine

## 2018-03-28 LAB — URINE CULTURE
MICRO NUMBER: 90940397
SPECIMEN QUALITY: ADEQUATE

## 2018-03-31 DIAGNOSIS — N342 Other urethritis: Secondary | ICD-10-CM | POA: Diagnosis not present

## 2018-04-15 ENCOUNTER — Other Ambulatory Visit: Payer: Self-pay | Admitting: Family Medicine

## 2018-05-01 DIAGNOSIS — N200 Calculus of kidney: Secondary | ICD-10-CM | POA: Diagnosis not present

## 2018-05-01 DIAGNOSIS — R339 Retention of urine, unspecified: Secondary | ICD-10-CM | POA: Diagnosis not present

## 2018-06-18 ENCOUNTER — Telehealth: Payer: Self-pay | Admitting: Obstetrics and Gynecology

## 2018-06-18 NOTE — Telephone Encounter (Signed)
Patient called and said she is concerned she may have "toxic shock." She said her itching is so bad, it feels like she is on fire from using a menstrual pad and is worried that is what is causing the problem. Patient is available to come in for an appointment today.  Last seen: 01/16/18

## 2018-06-18 NOTE — Telephone Encounter (Signed)
Spoke with patient. Patient reports external vaginal itching with use of sanitary pads. Never uses tampons.  Patient states she is perimenopausal, cycles have been irregular, only spotting the last 2 months. Every time she uses the pads the itching "drives her insane". Patient is concerned about toxic shock. Has Rx for nystatin powder and cream, has been using this with no relief. Has Rx for kenalog ointment, has provided relief in the past, thinks she may have a refill on file, will check with pharmacy.  Patient denies heavy bleeding, fever, chills, headache, N/V, diarrhea, sore throat, muscle aches, rash, vaginal d/c, pelvic pain.    Recommended OV for further evaluation, patient declined OV for today due to her work schedule. OV scheduled for 10/31 at 1:45pm with Dr. Talbert Nan. Recommended patient to stop using current pads, try alternative pad/cloth.   Routing to provider for final review. Patient is agreeable to disposition. Will close encounter.

## 2018-06-19 ENCOUNTER — Telehealth: Payer: Self-pay | Admitting: Obstetrics and Gynecology

## 2018-06-19 ENCOUNTER — Ambulatory Visit: Payer: Self-pay | Admitting: Obstetrics and Gynecology

## 2018-06-19 NOTE — Telephone Encounter (Signed)
Patient called and cancelled her appointment for vaginal itching today with Dr. Talbert Nan and said she is doing much better. She said she stopped using the sanitary napkins last night, which she is allergic to, and also used a cream the dermatologist gave her called tacrolimus ointment 0.1%. Patient stated she will call if she has any further concerns. Routing to provider for FYI. Okay to close encounter unless further follow up is needed.

## 2018-06-24 DIAGNOSIS — N3592 Unspecified urethral stricture, female: Secondary | ICD-10-CM | POA: Diagnosis not present

## 2018-06-24 DIAGNOSIS — N201 Calculus of ureter: Secondary | ICD-10-CM | POA: Diagnosis not present

## 2018-06-24 DIAGNOSIS — R300 Dysuria: Secondary | ICD-10-CM | POA: Diagnosis not present

## 2018-06-30 ENCOUNTER — Telehealth: Payer: Self-pay

## 2018-06-30 NOTE — Telephone Encounter (Signed)
Is it okay to place referral? Please Advise.

## 2018-06-30 NOTE — Telephone Encounter (Signed)
Copied from [place] [id number]. Topic: General - Other >> Jun 30, 2018  4:22 PM Leward Quan A wrote: Reason for CRM: Patient called to request a referral from Dr Betty Martin[place]  endocrinology Dr Philemon Kingdom Ph # [phone]

## 2018-07-01 ENCOUNTER — Other Ambulatory Visit: Payer: Self-pay | Admitting: *Deleted

## 2018-07-01 DIAGNOSIS — E1165 Type 2 diabetes mellitus with hyperglycemia: Secondary | ICD-10-CM

## 2018-07-01 NOTE — Telephone Encounter (Signed)
Referral placed as requested.

## 2018-07-01 NOTE — Telephone Encounter (Signed)
It is okay to place referral to endocrinologist, history of DM 2. Thanks, BJ

## 2018-07-02 DIAGNOSIS — N200 Calculus of kidney: Secondary | ICD-10-CM | POA: Diagnosis not present

## 2018-07-02 DIAGNOSIS — K439 Ventral hernia without obstruction or gangrene: Secondary | ICD-10-CM | POA: Diagnosis not present

## 2018-07-03 ENCOUNTER — Encounter: Payer: Self-pay | Admitting: Internal Medicine

## 2018-07-03 IMAGING — CR DG CHEST 2V
2 series · 2 of 2 positions shown · non-contrast
Comparison: None.

CLINICAL DATA: Chest pain radiating to the shoulders and down the
left arm. Cough beginning 3 days ago.

EXAM:
CHEST  2 VIEW

[w chest pa]
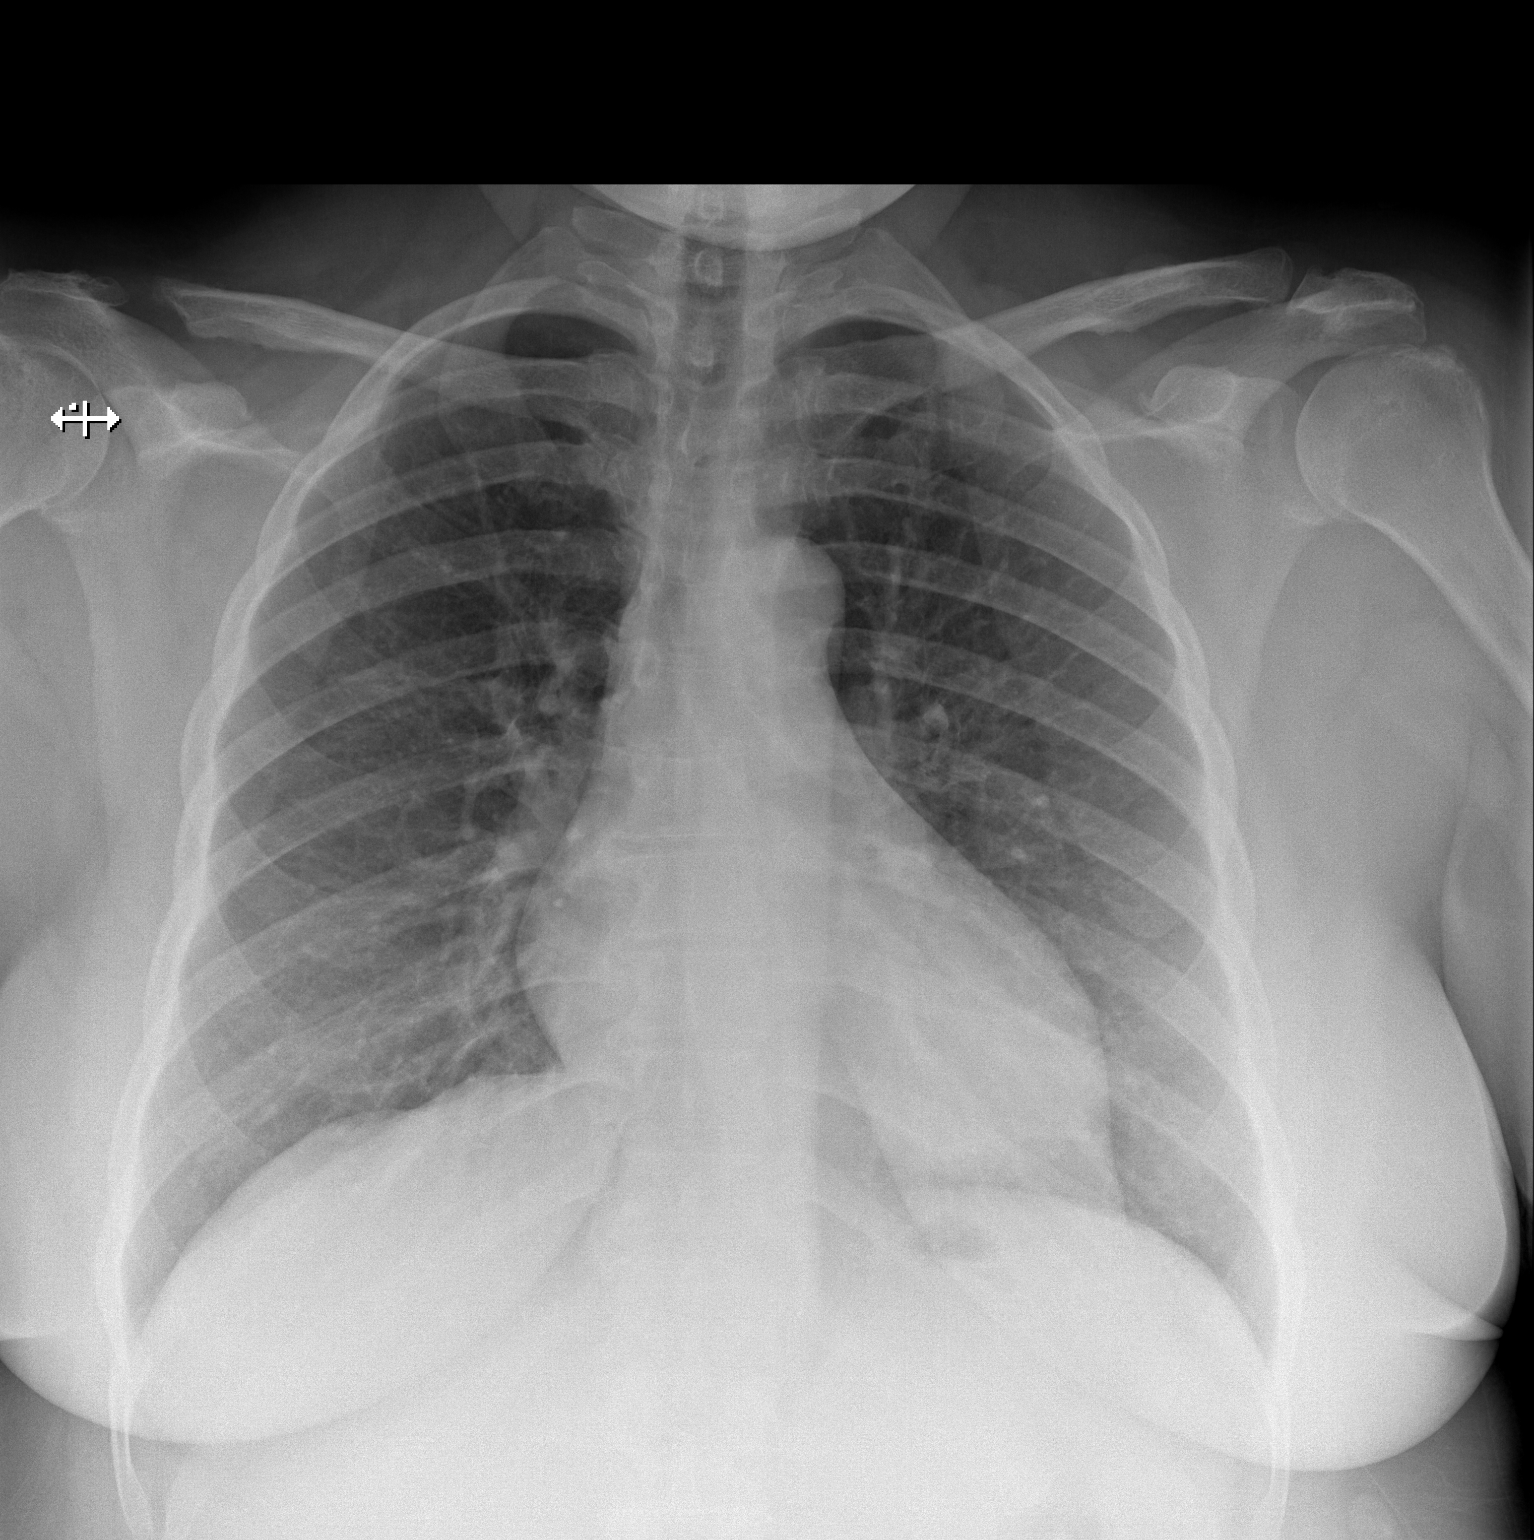

[w chest lat]
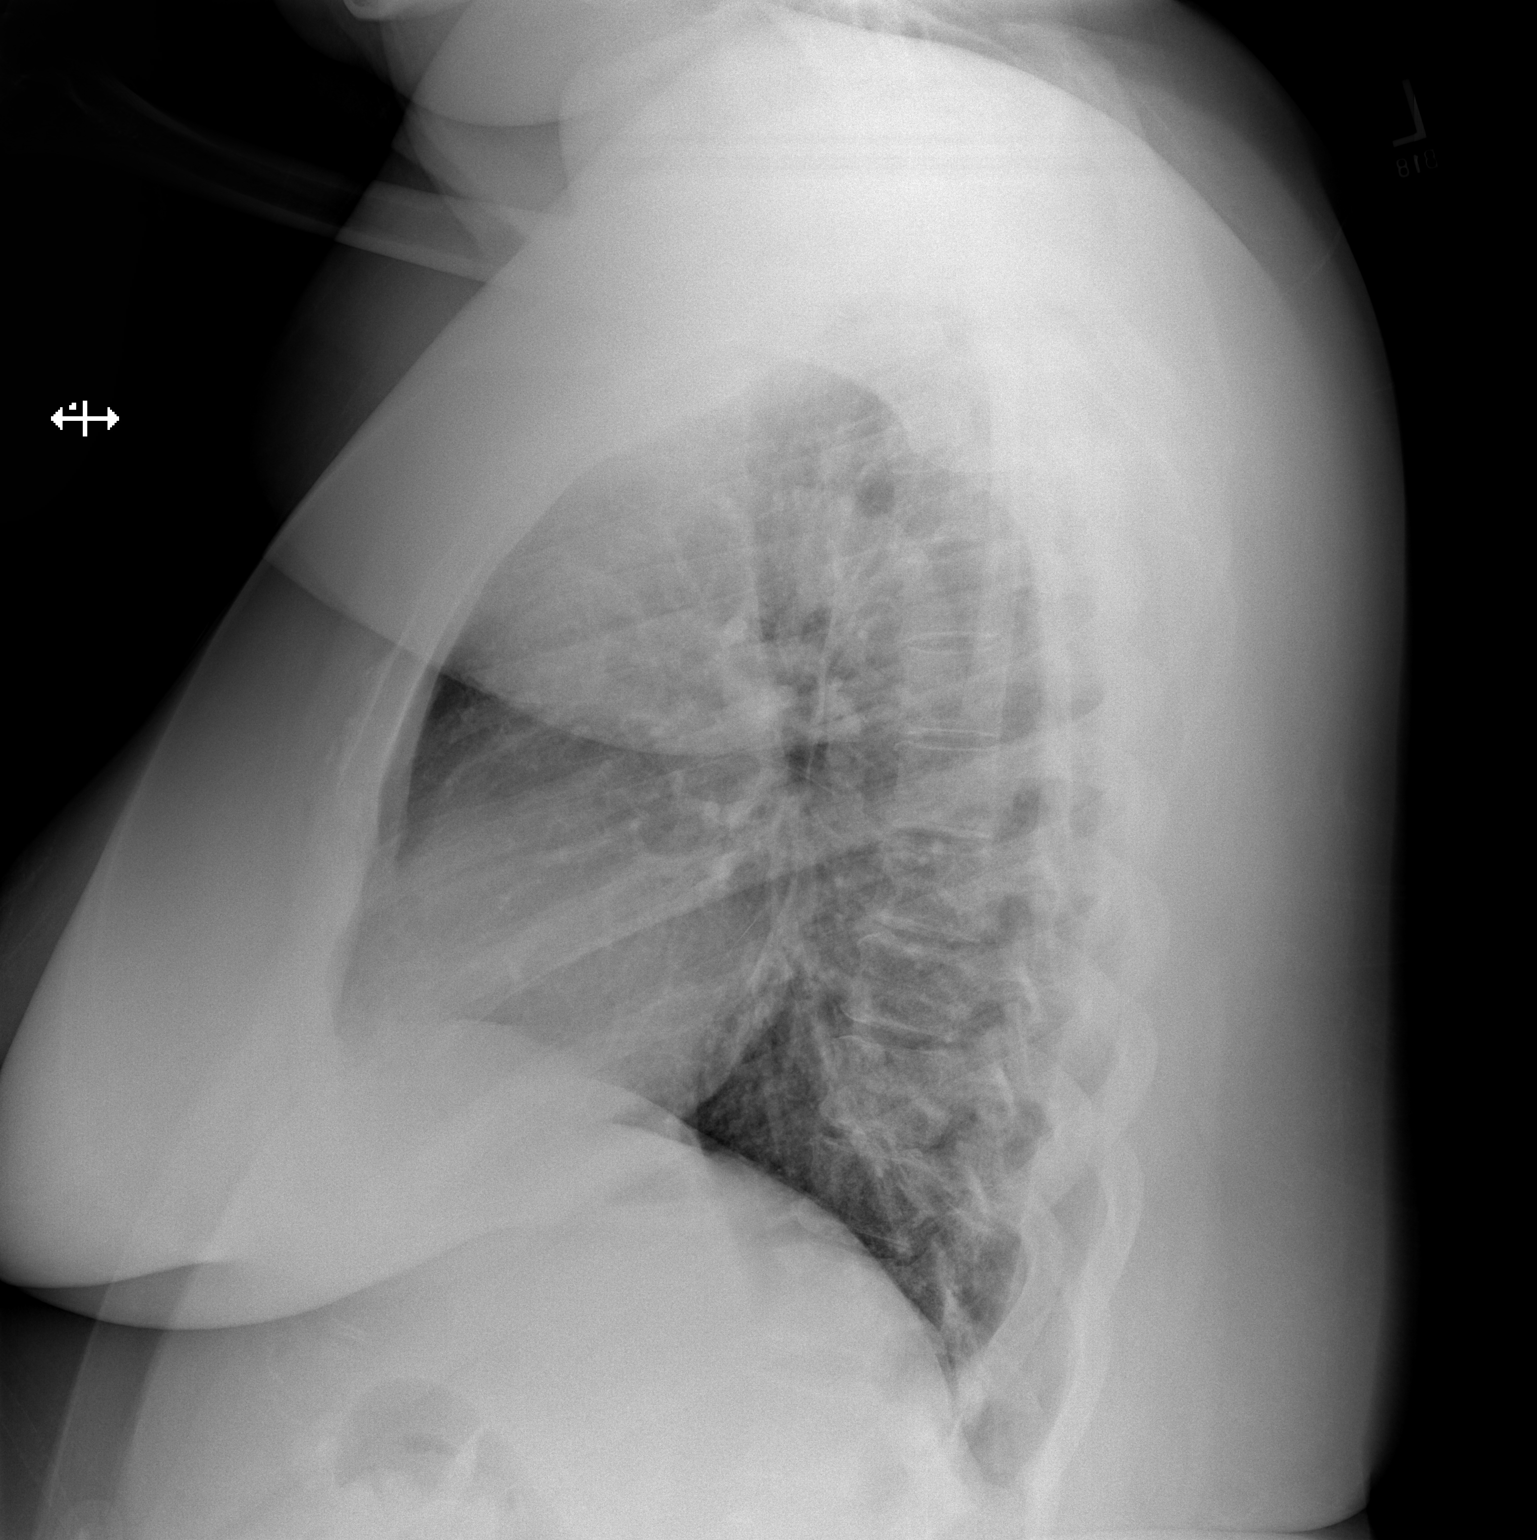

[2 of 2 positions shown; findings below may reference images not displayed]

FINDINGS: Heart size is normal. Mediastinal shadows are normal. The right lung
is clear. Question mild patchy infiltrate in the posterior left
lower lobe. No lobar consolidation or collapse. No effusions. No
bone abnormality.
IMPRESSION: Question small area of infiltrate in the posterior left lower lobe.
This is not certain.

## 2018-07-04 DIAGNOSIS — N3592 Unspecified urethral stricture, female: Secondary | ICD-10-CM | POA: Diagnosis not present

## 2018-07-15 ENCOUNTER — Other Ambulatory Visit: Payer: Self-pay | Admitting: *Deleted

## 2018-07-15 ENCOUNTER — Other Ambulatory Visit: Payer: Self-pay | Admitting: Obstetrics and Gynecology

## 2018-07-15 MED ORDER — IBUPROFEN 800 MG PO TABS: 800.0000 mg | ORAL_TABLET | Freq: Three times a day (TID) | ORAL | 1 refills | Status: DC | PRN

## 2018-07-15 NOTE — Telephone Encounter (Signed)
Patient is currently experiencing cramping and is requesting a refill prescription for the 800 MG of ibuprofen and 0.5% Triamcinolone.

## 2018-07-15 NOTE — Telephone Encounter (Signed)
Spoke with patient.   1. Patient requesting refill of 800 mg motrin for menses cramps. Last filled 09/18/17 #30/1RF. Rx pended. Pharmacy updated.  2. Patient reports vaginal itching has improved since stopping use if sanitary pads. Uses a wash cloth. Reviewed option if menses/Diva cup. Itching is still present, denies any other GYN symptoms. Requesting Rx for triamcinolone ointment. Advised will need OV for further evaluation, OV scheduled for 07/22/18 at 2:15pm with Dr. Talbert Nan. Patient declined OV for 11/26 or 11/27 due to work schedule.   3. Patient reports "skin tag growth" on vulva. Is unable to visualize, feels soft and movable, noticed this month. Will have Dr. Talbert Nan further evaluate at [facility].   Routing to Dr. Talbert Nan to advise on Motrin Rx.

## 2018-07-15 NOTE — Progress Notes (Signed)
Opened in error

## 2018-07-15 NOTE — Telephone Encounter (Signed)
Patient notified of Motrin refill.   Encounter closed.

## 2018-07-21 NOTE — Progress Notes (Signed)
GYNECOLOGY  VISIT   HPI: 52 y.o.   Married Black or [place] American Not Hispanic or Latino  female   [phone] with Patient's last menstrual period was 07/17/2018 (exact date).   here for ongoing vaginal itching.   She has had issues with intermittent vulvitis. She has been seeing a dermatologist for eczema. She knows she has eczema on her vulva. She is requesting triamcinolone ointment 0.5% and atarax. She saw a Urologist last month, he did a cystoscopy, gave her cipro. Her itching started after the cipro and the urologist called in diflucan. Her itching got better.  She has DM, not on medication. Felt worse with higher glucose on metformin. She states she checks her FS after meals, runs in the 150's.   GYNECOLOGIC HISTORY: Patient's last menstrual period was 07/17/2018 (exact date). Contraception: None Menopausal hormone therapy: Estradiol vaginal tablet        OB History    Gravida  3   Para  2   Term  2   Preterm      AB  1   Living  2     SAB      TAB      Ectopic      Multiple      Live Births  2              Patient Active Problem List   Diagnosis Date Noted  . Type II diabetes mellitus ([place])   . Eczema 04/15/2017  . Uncontrolled type 2 diabetes mellitus with hyperglycemia, without long-term current use of insulin ([place]) 12/13/2016  . Diabetes mellitus type II, uncontrolled ([place]) 12/10/2016  . Morbid obesity with BMI of 50.0-59.9, adult ([place]) 12/10/2016  . Allergic rhinitis 12/10/2016    Past Medical History:  Diagnosis Date  . Anemia   . Anxiety    panic attacks  . Arthritis   . Asthma   . Complication of anesthesia    difficulty breathing for a few a minutes after a procedure  . Difficult intubation    pt unsure   . Environmental and seasonal allergies   . Family history of adverse reaction to anesthesia    " my son hard a hard time waking up "  . GERD (gastroesophageal reflux disease)   . Headache   . Helicobacter pylori gastritis   .  Hernia, abdominal   . History of kidney stones   . Irregular heartbeat   . Pneumonia   . Recurrent sinus infections   . Type II diabetes mellitus ([place])     Past Surgical History:  Procedure Laterality Date  . APPENDECTOMY    . BIOPSY ENDOMETRIAL    . COLONOSCOPY W/ BIOPSIES AND POLYPECTOMY    . ESOPHAGOGASTRODUODENOSCOPY (EGD) WITH PROPOFOL N/A 11/26/2016   Procedure: ESOPHAGOGASTRODUODENOSCOPY (EGD) WITH PROPOFOL;  Surgeon: Otis Brace, MD;  Location: [facility];  Service: Gastroenterology;  Laterality: N/A;  . ROTATOR CUFF REPAIR      Current Outpatient Medications  Medication Sig Dispense Refill  . Estradiol 10 MCG TABS vaginal tablet Place 1 tablet (10 mcg total) vaginally 2 (two) times a week. 24 tablet 3  . hydrocortisone (ANUSOL-HC) 25 MG suppository Place 25 mg rectally 2 (two) times daily as needed for hemorrhoids.     Marland Kitchen ibuprofen (ADVIL,MOTRIN) 800 MG tablet Take 1 tablet (800 mg total) by mouth every 8 (eight) hours as needed. 30 tablet 1  . nystatin (MYCOSTATIN/NYSTOP) powder Apply topically 3 (three) times daily as needed. 45 g 0  .  nystatin cream (MYCOSTATIN) Apply 1 application topically 2 (two) times daily. 30 g 1  . PROCTOZONE-HC 2.5 % rectal cream USE TWICE DAILY FOR 1 WEEK, THEN ONCE A DAY FOR 1 WEEK , THEN EVERY OTHER DAY FOR ONE WEEK 28 g 0  . triamcinolone ointment (KENALOG) 0.5 % APPLY A PEA SIZED AMOUNT OF OINTMENT TOPICALLY TO AFFECTED AREA TWICE DAILY FOR 1 TO 2 WEEKS. **NOT FOR DAILY LONG TERM USE** 15 g 1  . doxepin (SINEQUAN) 10 MG capsule Take 1 capsule (10 mg total) by mouth at bedtime as needed. (Patient not taking: Reported on 07/22/2018) 30 capsule 1  . hydrOXYzine (ATARAX/VISTARIL) 25 MG tablet Take 1 tablet (25 mg total) by mouth 3 (three) times daily as needed for itching. (Patient not taking: Reported on 07/22/2018) 30 tablet 1  . pantoprazole (PROTONIX) 40 MG tablet Take 40 mg by mouth daily as needed (heartburn).      No current  facility-administered medications for this visit.      ALLERGIES: Adhesive [tape]; Fish allergy; Latex; Peanut oil; and Sulfa antibiotics  Family History  Problem Relation Age of Onset  . Diabetes Mother   . Heart attack Mother   . Colon cancer Father   . Cervical cancer Sister   . Diabetes Sister   . Heart attack Sister   . Epilepsy Brother     Social History   Socioeconomic History  . Marital status: Married    Spouse name: Not on file  . Number of children: Not on file  . Years of education: Not on file  . Highest education level: Not on file  Occupational History  . Not on file  Social Needs  . Financial resource strain: Not on file  . Food insecurity:    Worry: Not on file    Inability: Not on file  . Transportation needs:    Medical: Not on file    Non-medical: Not on file  Tobacco Use  . Smoking status: Never Smoker  . Smokeless tobacco: Never Used  Substance and Sexual Activity  . Alcohol use: Yes    Comment: occ  . Drug use: No  . Sexual activity: Not Currently    Partners: Male    Birth control/protection: None  Lifestyle  . Physical activity:    Days per week: Not on file    Minutes per session: Not on file  . Stress: Not on file  Relationships  . Social connections:    Talks on phone: Not on file    Gets together: Not on file    Attends religious service: Not on file    Active member of club or organization: Not on file    Attends meetings of clubs or organizations: Not on file    Relationship status: Not on file  . Intimate partner violence:    Fear of current or ex partner: Not on file    Emotionally abused: Not on file    Physically abused: Not on file    Forced sexual activity: Not on file  Other Topics Concern  . Not on file  Social History Narrative  . Not on file    Review of Systems  Constitutional: Negative.   HENT: Negative.   Eyes: Negative.   Respiratory: Negative.   Cardiovascular: Positive for leg swelling.   Gastrointestinal: Negative.   Genitourinary: Positive for urgency.       Loss of urine with sneeze/cough Vaginal itching Vaginal bumps Loss of sexual interest  Musculoskeletal: Negative.  Skin: Positive for itching.  Neurological: Positive for weakness.  Endo/Heme/Allergies: Bruises/bleeds easily.  Psychiatric/Behavioral: Negative.     PHYSICAL EXAMINATION:    BP 132/82 (BP Location: Right Arm, Patient Position: Sitting, Cuff Size: Large)   Pulse 72   Wt (!) 317 lb 12.8 oz (144.2 kg)   LMP 07/17/2018 (Exact Date)   BMI 52.08 kg/m     General appearance: alert, cooperative and appears stated age Skin: under her panus she has a patchy dark rash, some fissures  Pelvic: External genitalia:  On the mons on the right is a patch of irritated tissue, whitened with a fissure, c/w lichen simplex chronicus. Similar less severe area in the left panty line.               Urethra:  normal appearing urethra with no masses, tenderness or lesions              Bartholins and Skenes: normal                 Vagina: normal appearing vagina with normal color and discharge, no lesions              Cervix: no lesions  Chaperone was present for exam.  ASSESSMENT Vulvitis, ongoing intermittent issues. 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  Aware of vulvar skin care Will send affirm Atarax for severe itching She will discuss long term treatment of eczema with her dermatologist (has eczema in other areas) Nystatin for candida intertrigo (she has some ointment, needs more powder)   An After Visit Summary was printed and given to the patient.  ~15 minutes face to face time of which over 50% was spent in counseling.

## 2018-07-22 ENCOUNTER — Ambulatory Visit: Payer: 59 | Admitting: Obstetrics and Gynecology

## 2018-07-22 ENCOUNTER — Other Ambulatory Visit: Payer: Self-pay

## 2018-07-22 ENCOUNTER — Encounter: Payer: Self-pay | Admitting: Obstetrics and Gynecology

## 2018-07-22 VITALS — BP 132/82 | HR 72 | Wt 317.8 lb

## 2018-07-22 DIAGNOSIS — L309 Dermatitis, unspecified: Secondary | ICD-10-CM

## 2018-07-22 DIAGNOSIS — R3915 Urgency of urination: Secondary | ICD-10-CM | POA: Diagnosis not present

## 2018-07-22 DIAGNOSIS — N763 Subacute and chronic vulvitis: Secondary | ICD-10-CM | POA: Diagnosis not present

## 2018-07-22 DIAGNOSIS — B372 Candidiasis of skin and nail: Secondary | ICD-10-CM | POA: Diagnosis not present

## 2018-07-22 DIAGNOSIS — L280 Lichen simplex chronicus: Secondary | ICD-10-CM

## 2018-07-22 LAB — POCT URINALYSIS DIPSTICK
Bilirubin, UA: NEGATIVE
Blood, UA: NEGATIVE
GLUCOSE UA: NEGATIVE
KETONES UA: NEGATIVE
Leukocytes, UA: NEGATIVE
Nitrite, UA: NEGATIVE
Protein, UA: NEGATIVE
Spec Grav, UA: 1.01 (ref 1.010–1.025)
Urobilinogen, UA: 0.2 E.U./dL
pH, UA: 5 (ref 5.0–8.0)

## 2018-07-22 MED ORDER — TRIAMCINOLONE ACETONIDE 0.5 % EX OINT: TOPICAL_OINTMENT | CUTANEOUS | 1 refills | Status: DC

## 2018-07-22 MED ORDER — NYSTATIN 100000 UNIT/GM EX POWD: Freq: Three times a day (TID) | CUTANEOUS | 1 refills | Status: DC | PRN

## 2018-07-22 MED ORDER — HYDROXYZINE HCL 25 MG PO TABS: 25.0000 mg | ORAL_TABLET | Freq: Three times a day (TID) | ORAL | 1 refills | Status: DC | PRN

## 2018-07-22 NOTE — Addendum Note (Signed)
Addended by: Reesa Chew E on: 07/22/2018 04:01 PM   Modules accepted: Orders

## 2018-07-23 LAB — VAGINITIS/VAGINOSIS, DNA PROBE
Candida Species: NEGATIVE
Gardnerella vaginalis: NEGATIVE
Trichomonas vaginosis: NEGATIVE

## 2018-07-24 DIAGNOSIS — Z1239 Encounter for other screening for malignant neoplasm of breast: Secondary | ICD-10-CM | POA: Diagnosis not present

## 2018-07-24 DIAGNOSIS — Z1231 Encounter for screening mammogram for malignant neoplasm of breast: Secondary | ICD-10-CM | POA: Diagnosis not present

## 2018-07-29 ENCOUNTER — Ambulatory Visit: Payer: 59 | Admitting: Internal Medicine

## 2018-07-29 DIAGNOSIS — Z0289 Encounter for other administrative examinations: Secondary | ICD-10-CM

## 2018-07-29 NOTE — Progress Notes (Deleted)
Patient ID: Tanya Houston, female   DOB: 06-25-1966, 52 y.o.   MRN: [id number]   HPI: Tanya Houston is a 52 y.o.-year-old female, referred by her PCP, Dr. Martinique, for management of DM2, dx in ***, non-insulin-dependent, uncontrolled, without long-term complications.  Reviewed latest HbA1c level: Lab Results  Component Value Date   HGBA1C 7.9 (H) 02/14/2018   HGBA1C 7.0 (H) 05/17/2017   HGBA1C 6.7 (H) 02/07/2017   HGBA1C 7.6 (H) 12/10/2016   HGBA1C 6.20 01/18/2016   Of note, HbA1c calculated from fructosamine was much better: 5.7%. Component     Latest Ref Rng & Units 02/14/2018  Fructosamine     190 - 270 umol/L 240    She is not on any medications for DM now.   She was on Metformin before >> felt sugars were worse.  Pt checks her sugars *** a day and they are: - am: n/c - 2h after b'fast: n/c - before lunch: n/c - 2h after lunch: n/c - before dinner: n/c - 2h after dinner: n/c - bedtime: n/c - nighttime: n/c Lowest sugar was ***; she has hypoglycemia awareness at 70.  Highest sugar was ***.  Glucometer:***  Pt's meals are: - Breakfast: - Lunch: - Dinner: - Snacks:  - no CKD, last BUN/creatinine:  Lab Results  Component Value Date   BUN 9 02/14/2018   BUN 9 10/07/2017   CREATININE 0.73 02/14/2018   CREATININE 0.79 10/07/2017   - no MAU: Lab Results  Component Value Date   MICRALBCREAT 3.4 02/14/2018   MICRALBCREAT 0.7 12/10/2016   - + HL; last set of lipids: Lab Results  Component Value Date   CHOL 225 (H) 12/10/2016   HDL 55.20 12/10/2016   LDLCALC 151 (H) 12/10/2016   TRIG 93.0 12/10/2016   CHOLHDL 4 12/10/2016   - last eye exam was in ***. No DR.   - no numbness and tingling in her feet.  Pt has FH of DM in ***.  ROS: Constitutional: no weight gain, no weight loss, no fatigue, no subjective hyperthermia, no subjective hypothermia, no nocturia Eyes: no blurry vision, no xerophthalmia ENT: no sore throat, no nodules palpated in neck, no  dysphagia, no odynophagia, no hoarseness, no tinnitus, no hypoacusis Cardiovascular: no CP, no SOB, no palpitations, no leg swelling Respiratory: no cough, no SOB, no wheezing Gastrointestinal: no N, no V, no D, no C, no acid reflux Musculoskeletal: no muscle, no joint aches Skin: no rash, no hair loss Neurological: no tremors, no numbness or tingling/no dizziness/no HAs Psychiatric: no depression, no anxiety  PE: LMP 07/17/2018 (Exact Date)  Wt Readings from Last 3 Encounters:  07/22/18 (!) 317 lb 12.8 oz (144.2 kg)  03/27/18 (!) 314 lb (142.4 kg)  02/14/18 (!) 312 lb 2 oz (141.6 kg)   Constitutional: obese, in NAD Eyes: PERRLA, EOMI, no exophthalmos ENT: moist mucous membranes, no thyromegaly, no cervical lymphadenopathy Cardiovascular: RRR, No MRG Respiratory: CTA B Gastrointestinal: abdomen soft, NT, ND, BS+ Musculoskeletal: no deformities, strength intact in all 4 Skin: moist, warm, no rashes Neurological: no tremor with outstretched hands, DTR normal in all 4  ASSESSMENT: 1. DM2, non-insulin-dependent, uncontrolled, without long-term complications  PLAN:  1. Patient with long-standing, now diet-controlled diabetes, with good control based on sugars at home and fructosamine level. - I suggested to:  There are no Patient Instructions on file for this visit. - Strongly advised her to start checking sugars at different times of the day - check 1x a day, rotating checks -  discussed about CBG targets for treatment: 80-130 mg/dL before meals and <180 mg/dL after meals; target HbA1c <7%. - given sugar log and advised how to fill it and to bring it at next appt  - given foot care handout and explained the principles  - given instructions for hypoglycemia management "15-15 rule"  - advised for yearly eye exams  - Return to clinic in 3 mo with sugar log   Philemon Kingdom, MD PhD [facility] Endocrinology

## 2018-08-01 ENCOUNTER — Ambulatory Visit: Payer: 59 | Admitting: Internal Medicine

## 2018-08-01 NOTE — Progress Notes (Deleted)
Patient ID: Tanya Houston, female   DOB: 1966/05/25, 52 y.o.   MRN: [id number]   HPI: Tanya Houston is a 52 y.o.-year-old female, referred by her PCP, Dr. Martinique, for management of DM2, dx in ***, non-insulin-dependent, uncontrolled, without long-term complications.   Reviewed latest HbA1c level: Lab Results  Component Value Date   HGBA1C 7.9 (H) 02/14/2018   HGBA1C 7.0 (H) 05/17/2017   HGBA1C 6.7 (H) 02/07/2017   HGBA1C 7.6 (H) 12/10/2016   HGBA1C 6.20 01/18/2016   Of note, HbA1c calculated from fructosamine was much better: 5.7%. Component     Latest Ref Rng & Units 02/14/2018  Fructosamine     190 - 270 umol/L 240    She is not on any medications for DM now.   She was on Metformin before >> felt sugars were worse.  Pt checks her sugars *** a day and they are: - am: n/c - 2h after b'fast: n/c - before lunch: n/c - 2h after lunch: n/c - before dinner: n/c - 2h after dinner: n/c - bedtime: n/c - nighttime: n/c Lowest sugar was ***; she has hypoglycemia awareness at 70.  Highest sugar was ***.  Glucometer:***  Pt's meals are: - Breakfast: - Lunch: - Dinner: - Snacks:  - no CKD, last BUN/creatinine:  Lab Results  Component Value Date   BUN 9 02/14/2018   BUN 9 10/07/2017   CREATININE 0.73 02/14/2018   CREATININE 0.79 10/07/2017   No MAU: Lab Results  Component Value Date   MICRALBCREAT 3.4 02/14/2018   MICRALBCREAT 0.7 12/10/2016   - + HL; last set of lipids: Lab Results  Component Value Date   CHOL 225 (H) 12/10/2016   HDL 55.20 12/10/2016   LDLCALC 151 (H) 12/10/2016   TRIG 93.0 12/10/2016   CHOLHDL 4 12/10/2016   - last eye exam was in ***. No DR.   - no numbness and tingling in her feet.  Pt has FH of DM in ***.  ROS: Constitutional: no weight gain, no weight loss, no fatigue, no subjective hyperthermia, no subjective hypothermia, no nocturia Eyes: no blurry vision, no xerophthalmia ENT: no sore throat, no nodules palpated in neck, no  dysphagia, no odynophagia, no hoarseness, no tinnitus, no hypoacusis Cardiovascular: no CP, no SOB, no palpitations, no leg swelling Respiratory: no cough, no SOB, no wheezing Gastrointestinal: no N, no V, no D, no C, no acid reflux Musculoskeletal: no muscle, no joint aches Skin: no rash, no hair loss Neurological: no tremors, no numbness or tingling/no dizziness/no HAs Psychiatric: no depression, no anxiety  PE: LMP 07/17/2018 (Exact Date)  Wt Readings from Last 3 Encounters:  07/22/18 (!) 317 lb 12.8 oz (144.2 kg)  03/27/18 (!) 314 lb (142.4 kg)  02/14/18 (!) 312 lb 2 oz (141.6 kg)   Constitutional: obese, in NAD Eyes: PERRLA, EOMI, no exophthalmos ENT: moist mucous membranes, no thyromegaly, no cervical lymphadenopathy Cardiovascular: RRR, No MRG Respiratory: CTA B Gastrointestinal: abdomen soft, NT, ND, BS+ Musculoskeletal: no deformities, strength intact in all 4 Skin: moist, warm, no rashes Neurological: no tremor with outstretched hands, DTR normal in all 4  ASSESSMENT: 1. DM2, non-insulin-dependent, uncontrolled, without long-term complications, but with hyperglycemia  PLAN:  1. Patient with long-standing DM2, with good control based on sugars at home and fructosamine level. - I suggested to:  There are no Patient Instructions on file for this visit. - Strongly advised her to start checking sugars at different times of the day - check 1x a day, rotating checks -  discussed about CBG targets for treatment: 80-130 mg/dL before meals and <180 mg/dL after meals; target HbA1c <7%. - given sugar log and advised how to fill it and to bring it at next appt  - given foot care handout and explained the principles  - given instructions for hypoglycemia management "15-15 rule"  - advised for yearly eye exams  - Return to clinic in 3 mo with sugar log   Philemon Kingdom, MD PhD [facility] Endocrinology

## 2018-08-02 DIAGNOSIS — Z0100 Encounter for examination of eyes and vision without abnormal findings: Secondary | ICD-10-CM | POA: Diagnosis not present

## 2018-08-05 ENCOUNTER — Encounter: Payer: Self-pay | Admitting: Obstetrics and Gynecology

## 2018-08-29 ENCOUNTER — Ambulatory Visit: Payer: 59 | Admitting: Family Medicine

## 2018-08-29 ENCOUNTER — Encounter: Payer: Self-pay | Admitting: Family Medicine

## 2018-08-29 VITALS — BP 126/78 | HR 78 | Temp 98.6°F | Resp 12 | Ht 65.5 in | Wt 315.2 lb

## 2018-08-29 DIAGNOSIS — R05 Cough: Secondary | ICD-10-CM

## 2018-08-29 DIAGNOSIS — J45909 Unspecified asthma, uncomplicated: Secondary | ICD-10-CM

## 2018-08-29 DIAGNOSIS — E1165 Type 2 diabetes mellitus with hyperglycemia: Secondary | ICD-10-CM

## 2018-08-29 DIAGNOSIS — L292 Pruritus vulvae: Secondary | ICD-10-CM | POA: Insufficient documentation

## 2018-08-29 DIAGNOSIS — R059 Cough, unspecified: Secondary | ICD-10-CM

## 2018-08-29 DIAGNOSIS — Z6841 Body Mass Index (BMI) 40.0 and over, adult: Secondary | ICD-10-CM

## 2018-08-29 LAB — POCT GLYCOSYLATED HEMOGLOBIN (HGB A1C): Hemoglobin A1C: 7.3 % — AB (ref 4.0–5.6)

## 2018-08-29 MED ORDER — HYDROCODONE-HOMATROPINE 5-1.5 MG/5ML PO SYRP: 5.0000 mL | ORAL_SOLUTION | Freq: Two times a day (BID) | ORAL | 0 refills | Status: DC | PRN

## 2018-08-29 MED ORDER — PREDNISONE 20 MG PO TABS: 40.0000 mg | ORAL_TABLET | Freq: Every day | ORAL | 0 refills | Status: AC

## 2018-08-29 MED ORDER — ALBUTEROL SULFATE HFA 108 (90 BASE) MCG/ACT IN AERS: 2.0000 | INHALATION_SPRAY | Freq: Four times a day (QID) | RESPIRATORY_TRACT | 0 refills | Status: DC | PRN

## 2018-08-29 MED ORDER — BENZONATATE 100 MG PO CAPS: 200.0000 mg | ORAL_CAPSULE | Freq: Two times a day (BID) | ORAL | 0 refills | Status: AC | PRN

## 2018-08-29 MED ORDER — LIRAGLUTIDE 18 MG/3ML ~~LOC~~ SOPN: PEN_INJECTOR | SUBCUTANEOUS | 3 refills | Status: DC

## 2018-08-29 NOTE — Assessment & Plan Note (Signed)
This is a chronic problem. We discussed possible etiologies as well as side effects of chronic topical steroid use. She has follow-up with dermatologist and with her gynecologist. Recommend arranging appointment with gynecologist to discuss further work-up and/or treatment options.

## 2018-08-29 NOTE — Patient Instructions (Signed)
A few things to remember from today's visit:   Vulvovaginal pruritus  Cough  Uncontrolled type 2 diabetes mellitus with hyperglycemia, without long-term current use of insulin ([place]) - Plan: POCT glycosylated hemoglobin (Hb A1C)  Mild reactive airways disease, unspecified whether persistent - Plan: predniSONE (DELTASONE) 20 MG tablet, albuterol (PROVENTIL HFA;VENTOLIN HFA) 108 (90 Base) MCG/ACT inhaler  Albuterol inh 2 puff every 6 hours for a week then as needed for wheezing or shortness of breath.  Take prednisone with breakfast for 3 to 5 days.   Please be sure medication list is accurate. If a new problem present, please set up appointment sooner than planned today.

## 2018-08-29 NOTE — Assessment & Plan Note (Signed)
Since her last OV here she has gained about 3 Lb. We discussed benefits of wt loss as well as adverse effects of obesity. Consistency with healthy diet and physical activity recommended. Daily brisk walking for 15-30 min as tolerated. Victoza started today.

## 2018-08-29 NOTE — Assessment & Plan Note (Signed)
HgA1C is not at goal. We discussed different treatment options as well as side effects.  In the past she has not tolerated metformin well. I think Victoza is a good option for her, we discussed side effects.  Initially she is reluctant to try pharmacologic treatment but finally she agrees. Regular exercise and healthy diet with avoidance of added sugar food intake is an important part of treatment and recommended. Annual eye exam, periodic dental and foot care recommended. F/U in 4 months

## 2018-08-29 NOTE — Progress Notes (Signed)
ACUTE VISIT  HPI:  Chief Complaint  Patient presents with  . Cough    green mucus, sx started a little over 2 1/2 weeks ago  . Nasal drainage  . Chest congestion    [name] is a 53 y.o.female here today complaining of "a little over 2 weeks" of respiratory symptoms. She has other concerns not related to CC.  Productive cough with greenish sputum,denies hemoptysis. Chest soreness exacerbated by cough. Denies SOB but has had intermittent wheezing. Hx of asthma, she has not used Albuterol inh. Cough is aggravated by cold and eating sweets.  Subjective fever x 2 days when symptoms just started,2 weeks ago. + Chills.  Having nasal congestion and rhinorrhea.   Cough  This is a new problem. The current episode started 1 to 4 weeks ago. The problem has been waxing and waning. The cough is productive of sputum. Associated symptoms include ear congestion, nasal congestion, postnasal drip and rhinorrhea. Pertinent negatives include no chills, ear pain, eye redness, fever, headaches, heartburn, hemoptysis, myalgias, rash, sore throat, shortness of breath, sweats, weight loss or wheezing. The symptoms are aggravated by cold air and other. She has tried OTC cough suppressant for the symptoms. The treatment provided mild relief. Her past medical history is significant for asthma and environmental allergies.    No Hx of recent travel. Her grandchildren have been sick. No known insect bite.  OTC medications for this problem: Robitussin.   Left-sided cervical 'knot", which she has ahd for months, not growth,not tenderness,and no skin changes.  She also would like to follow-up on DM 2. She has not been compliant with follow-ups. She has refused pharmacologic treatment for DM2.  Lab Results  Component Value Date   HGBA1C 7.9 (H) 02/14/2018   Denies abdominal pain, nausea,vomiting, polydipsia,polyuria, or polyphagia.  Concerned about not being able to loss wt. She  is not exercising regularly. In regard to her diet she has done "somethings." Decreased sweet intake and portions. Reporting episodes of "shaking" when BS's in the 60's, this happens when she skips meals.   She is also complaining about vaginal pruritus, which has been going on for several months. Skin pruritus she has had for years has improved with doxepin 10 mg daily but this medication has not helped with vaginal symptoms. According to patient, she has discussed problem with her gynecologist. She has also followed with dermatologist.  She states that she did some research online and "all" her symptoms suggest vulvar dermatitis. Currently she is on topical steroid, which in general has helped but she does not feel like she should be using topical steroids indefinitely. She denies vaginal discharge or vaginal bleeding.    Review of Systems  Constitutional: Positive for fatigue. Negative for activity change, appetite change, chills, fever and weight loss.  HENT: Positive for congestion, postnasal drip and rhinorrhea. Negative for ear pain, facial swelling, mouth sores, sinus pressure, sore throat, trouble swallowing and voice change.   Eyes: Negative for discharge and redness.  Respiratory: Positive for cough. Negative for hemoptysis, shortness of breath and wheezing.   Cardiovascular: Positive for leg swelling. Negative for palpitations.  Gastrointestinal: Negative for abdominal pain, diarrhea, heartburn, nausea and vomiting.  Endocrine: Negative for polydipsia, polyphagia and polyuria.  Genitourinary: Negative for dysuria, pelvic pain, vaginal bleeding and vaginal discharge.  Musculoskeletal: Negative for gait problem, myalgias and neck pain.  Skin: Negative for rash and wound.  Allergic/Immunologic: Positive for environmental allergies.  Neurological: Negative for syncope, weakness  and headaches.  Hematological: Negative for adenopathy. Does not bruise/bleed easily.    Psychiatric/Behavioral: Positive for sleep disturbance. The patient is nervous/anxious.      Current Outpatient Medications on File Prior to Visit  Medication Sig Dispense Refill  . doxepin (SINEQUAN) 10 MG capsule Take 1 capsule (10 mg total) by mouth at bedtime as needed. 30 capsule 1  . Estradiol 10 MCG TABS vaginal tablet Place 1 tablet (10 mcg total) vaginally 2 (two) times a week. 24 tablet 3  . hydrocortisone (ANUSOL-HC) 25 MG suppository Place 25 mg rectally 2 (two) times daily as needed for hemorrhoids.     . hydrOXYzine (ATARAX/VISTARIL) 25 MG tablet Take 1 tablet (25 mg total) by mouth 3 (three) times daily as needed for itching. 30 tablet 1  . ibuprofen (ADVIL,MOTRIN) 800 MG tablet Take 1 tablet (800 mg total) by mouth every 8 (eight) hours as needed. 30 tablet 1  . nystatin (MYCOSTATIN/NYSTOP) powder Apply topically 3 (three) times daily as needed. 45 g 1  . nystatin cream (MYCOSTATIN) Apply 1 application topically 2 (two) times daily. 30 g 1  . pantoprazole (PROTONIX) 40 MG tablet Take 40 mg by mouth daily as needed (heartburn).     Marland Kitchen PROCTOZONE-HC 2.5 % rectal cream USE TWICE DAILY FOR 1 WEEK, THEN ONCE A DAY FOR 1 WEEK , THEN EVERY OTHER DAY FOR ONE WEEK 28 g 0  . triamcinolone ointment (KENALOG) 0.5 % APPLY A PEA SIZED AMOUNT OF OINTMENT TOPICALLY TO AFFECTED AREA TWICE DAILY FOR 1 TO 2 WEEKS. **NOT FOR DAILY LONG TERM USE** At most use 1-2 x a week for maintenance 30 g 1   No current facility-administered medications on file prior to visit.      Past Medical History:  Diagnosis Date  . Anemia   . Anxiety    panic attacks  . Arthritis   . Asthma   . Complication of anesthesia    difficulty breathing for a few a minutes after a procedure  . Difficult intubation    pt unsure   . Environmental and seasonal allergies   . Family history of adverse reaction to anesthesia    " my son hard a hard time waking up "  . GERD (gastroesophageal reflux disease)   . Headache    . Helicobacter pylori gastritis   . Hernia, abdominal   . History of kidney stones   . Irregular heartbeat   . Pneumonia   . Recurrent sinus infections   . Type II diabetes mellitus (HCC)    Allergies  Allergen Reactions  . Adhesive [Tape] Hives    And Band Aids  . Fish Allergy Shortness Of Breath and Itching  . Latex Hives  . Peanut Oil   . Sulfa Antibiotics Hives   Past Surgical History:  Procedure Laterality Date  . APPENDECTOMY    . BIOPSY ENDOMETRIAL    . COLONOSCOPY W/ BIOPSIES AND POLYPECTOMY    . ESOPHAGOGASTRODUODENOSCOPY (EGD) WITH PROPOFOL N/A 11/26/2016   Procedure: ESOPHAGOGASTRODUODENOSCOPY (EGD) WITH PROPOFOL;  Surgeon: Otis Brace, MD;  Location: [facility];  Service: Gastroenterology;  Laterality: N/A;  . ROTATOR CUFF REPAIR      Family History  Problem Relation Age of Onset  . Diabetes Mother   . Heart attack Mother   . Colon cancer Father   . Cervical cancer Sister   . Diabetes Sister   . Heart attack Sister   . Epilepsy Brother     Social History   Socioeconomic History  .  Marital status: Married    Spouse name: Not on file  . Number of children: Not on file  . Years of education: Not on file  . Highest education level: Not on file  Occupational History  . Not on file  Social Needs  . Financial resource strain: Not on file  . Food insecurity:    Worry: Not on file    Inability: Not on file  . Transportation needs:    Medical: Not on file    Non-medical: Not on file  Tobacco Use  . Smoking status: Never Smoker  . Smokeless tobacco: Never Used  Substance and Sexual Activity  . Alcohol use: Yes    Comment: occ  . Drug use: No  . Sexual activity: Not Currently    Partners: Male    Birth control/protection: None  Lifestyle  . Physical activity:    Days per week: Not on file    Minutes per session: Not on file  . Stress: Not on file  Relationships  . Social connections:    Talks on phone: Not on file    Gets together: Not  on file    Attends religious service: Not on file    Active member of club or organization: Not on file    Attends meetings of clubs or organizations: Not on file    Relationship status: Not on file  Other Topics Concern  . Not on file  Social History Narrative  . Not on file    Vitals:   08/29/18 0700  BP: 126/78  Pulse: 78  Resp: 12  Temp: 98.6 F (37 C)  SpO2: 97%   Body mass index is 51.66 kg/m.  Wt Readings from Last 3 Encounters:  08/29/18 (!) 315 lb 4 oz (143 kg)  07/22/18 (!) 317 lb 12.8 oz (144.2 kg)  03/27/18 (!) 314 lb (142.4 kg)   02/14/18   312 Lb   Physical Exam  Nursing note and vitals reviewed. Constitutional: She is oriented to person, place, and time. She appears well-developed. No distress.  HENT:  Head: Normocephalic and atraumatic.  Right Ear: Tympanic membrane, external ear and ear canal normal.  Left Ear: Tympanic membrane, external ear and ear canal normal.  Nose: Septal deviation present. Right sinus exhibits no maxillary sinus tenderness and no frontal sinus tenderness. Left sinus exhibits no maxillary sinus tenderness and no frontal sinus tenderness.  Mouth/Throat: Oropharynx is clear and moist and mucous membranes are normal.  Eyes: Pupils are equal, round, and reactive to light. Conjunctivae are normal.  Cardiovascular: Normal rate and regular rhythm.  No murmur heard. Pulses:      Dorsalis pedis pulses are 2+ on the right side and 2+ on the left side.  Respiratory: Effort normal and breath sounds normal. No respiratory distress.  GI: Soft. She exhibits no mass. There is no hepatomegaly. There is no abdominal tenderness.  Genitourinary:    Genitourinary Comments: Deferred to gyn.   Musculoskeletal:        General: Edema (1+ pitting LE edema, bilateral) present.  Lymphadenopathy:    She has no cervical adenopathy.  Neurological: She is alert and oriented to person, place, and time. She has normal strength. No cranial nerve deficit. Gait  normal.  Skin: Skin is warm. No rash noted. No erythema.  Left side of posterior neck 3 cm,mobile, soft nodular lesion. No tender and no skin changes.  Psychiatric: Her mood appears anxious.  Well groomed, good eye contact.      ASSESSMENT  AND PLAN:  Ms. Soffia was seen today for cough, nasal drainage and chest congestion.  Diagnoses and all orders for this visit:  Lab Results  Component Value Date   HGBA1C 7.3 (A) 08/29/2018    Mild reactive airways disease, unspecified whether persistent Today auscultation is negative for wheezing or rales. Side effects of Prednisone discussed. Albuterol inh 2 puff every 6 hours for a week then as needed for wheezing or shortness of breath.   -     predniSONE (DELTASONE) 20 MG tablet; Take 2 tablets (40 mg total) by mouth daily with breakfast for 5 days. -     albuterol (PROVENTIL HFA;VENTOLIN HFA) 108 (90 Base) MCG/ACT inhaler; Inhale 2 puffs into the lungs every 6 (six) hours as needed for wheezing or shortness of breath.  Cough Possible causes discussed, ? Residual from recent URI,asthma,allergies,and GERD among some. Symptomatic treatment recommended. Side effects of Hydrocodone discussed. Further recommendations will be given according to imaging results.  -     HYDROcodone-homatropine (HYCODAN) 5-1.5 MG/5ML syrup; Take 5 mLs by mouth every 12 (twelve) hours as needed for cough. -     benzonatate (TESSALON) 100 MG capsule; Take 2 capsules (200 mg total) by mouth 2 (two) times daily as needed for up to 10 days. -     DG Chest 2 View; Future   Uncontrolled type 2 diabetes mellitus with hyperglycemia, without long-term current use of insulin ([place]) HgA1C is not at goal. We discussed different treatment options as well as side effects.  In the past she has not tolerated metformin well. I think Victoza is a good option for her, we discussed side effects.  Initially she is reluctant to try pharmacologic treatment but finally she  agrees. Regular exercise and healthy diet with avoidance of added sugar food intake is an important part of treatment and recommended. Annual eye exam, periodic dental and foot care recommended. 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 We discussed possible etiologies as well as side effects of chronic topical steroid use. She has follow-up with dermatologist and with her gynecologist. Recommend arranging appointment with gynecologist to discuss further work-up and/or treatment options.  Morbid obesity with BMI of 50.0-59.9, adult ([place]) Since her last OV here she has gained about 3 Lb. We discussed benefits of wt loss as well as adverse effects of obesity. Consistency with healthy diet and physical activity recommended. Daily brisk walking for 15-30 min as tolerated. Victoza started today.    40 min face to face OV. > 50% was dedicated to discussion of Dx, prognosis, treatment options, and coordination of care.  We discussed in detail adverse effects of poorly controlled DM 2 as well as side effects of Victoza. In regard to vulvovaginal pruritus, explained that this seems to be a chronic problem.  Lichen sclerosus is one of differential diagnosis to consider.  At this point I do not have further recommendations other than to continue following with her gynecologist.      Tanya Houston G. Martinique, MD  [facility]. [place] office.

## 2018-09-01 ENCOUNTER — Telehealth: Payer: Self-pay

## 2018-09-01 NOTE — Telephone Encounter (Signed)
Per [employer] message: Caller states she was seen yesterday for a cough. She was given Prednisone, a cough medicine, and injectable rx for diabetes (she doesn't have the needles for the pen). Her blood sugar after eating a piece of cake is 275. She isn't sure why her blood sugar is so high.   Pt is requesting needles for new insulin pen. She did not receive these. Please send in rx. Pt may also need assistance with understanding DM management, based on documentation in note from [facility].

## 2018-09-02 ENCOUNTER — Other Ambulatory Visit: Payer: Self-pay | Admitting: *Deleted

## 2018-09-02 MED ORDER — INSULIN PEN NEEDLE 29G X 12.7MM MISC: 2 refills | Status: DC

## 2018-09-02 NOTE — Telephone Encounter (Signed)
Needle pen can be sent to her pharmacy to be used with Victoza. Referral to diabetic education can be placed. Thanks, BJ

## 2018-09-03 ENCOUNTER — Other Ambulatory Visit: Payer: Self-pay | Admitting: *Deleted

## 2018-09-03 DIAGNOSIS — E1165 Type 2 diabetes mellitus with hyperglycemia: Secondary | ICD-10-CM

## 2018-09-03 NOTE — Telephone Encounter (Signed)
Pen needles sent to pharmacy. Referral placed for diabetic education.

## 2018-09-09 ENCOUNTER — Ambulatory Visit: Payer: 59 | Admitting: Family Medicine

## 2018-09-09 ENCOUNTER — Encounter: Payer: Self-pay | Admitting: Family Medicine

## 2018-09-09 VITALS — BP 124/80 | HR 86 | Temp 98.1°F | Resp 12 | Ht 65.5 in | Wt 313.0 lb

## 2018-09-09 DIAGNOSIS — R8290 Unspecified abnormal findings in urine: Secondary | ICD-10-CM

## 2018-09-09 DIAGNOSIS — E1165 Type 2 diabetes mellitus with hyperglycemia: Secondary | ICD-10-CM

## 2018-09-09 DIAGNOSIS — N898 Other specified noninflammatory disorders of vagina: Secondary | ICD-10-CM | POA: Diagnosis not present

## 2018-09-09 LAB — POC URINALSYSI DIPSTICK (AUTOMATED)
Bilirubin, UA: NEGATIVE
Blood, UA: NEGATIVE
Glucose, UA: NEGATIVE
Ketones, UA: NEGATIVE
Leukocytes, UA: NEGATIVE
Nitrite, UA: NEGATIVE
PH UA: 6 (ref 5.0–8.0)
PROTEIN UA: NEGATIVE
Spec Grav, UA: 1.02 (ref 1.010–1.025)
Urobilinogen, UA: 0.2 E.U./dL

## 2018-09-09 LAB — BASIC METABOLIC PANEL
BUN: 10 mg/dL (ref 6–23)
CO2: 28 mEq/L (ref 19–32)
Calcium: 9 mg/dL (ref 8.4–10.5)
Chloride: 102 mEq/L (ref 96–112)
Creatinine, Ser: 0.71 mg/dL (ref 0.40–1.20)
GFR: 104.8 mL/min (ref >60.00)
Glucose, Bld: 168 mg/dL — ABNORMAL HIGH (ref 70–99)
Potassium: 4.1 mEq/L (ref 3.5–5.1)
Sodium: 138 mEq/L (ref 135–145)

## 2018-09-09 MED ORDER — TERCONAZOLE 0.4 % VA CREA: 1.0000 | TOPICAL_CREAM | Freq: Every day | VAGINAL | 0 refills | Status: AC

## 2018-09-09 MED ORDER — FLUCONAZOLE 150 MG PO TABS: 150.0000 mg | ORAL_TABLET | Freq: Once | ORAL | 0 refills | Status: AC

## 2018-09-09 NOTE — Patient Instructions (Signed)
A few things to remember from today's visit:   Abnormal urine odor - Plan: POCT Urinalysis Dipstick (Automated), Culture, Urine  Uncontrolled type 2 diabetes mellitus with hyperglycemia, without long-term current use of insulin ([place]) - Plan: Basic metabolic panel, Ambulatory referral to Endocrinology  Vaginal pruritus - Plan: terconazole (TERAZOL 7) 0.4 % vaginal cream, fluconazole (DIFLUCAN) 150 MG tablet  Continue monitoring blood sugars at home. Somebody is going to be calling you for endocrinology appointment. Keep appointment with nutritionist.  Please arrange appointment with your gynecologist if vaginal itching continues.  I do not think you have a urine tract infection, still we will send urine for culture. Please be sure medication list is accurate. If a new problem present, please set up appointment sooner than planned today.

## 2018-09-09 NOTE — Progress Notes (Signed)
HPI:  Chief Complaint  Patient presents with  . Abnormal urine    Pt says she has had foul smelling urine in the morning with pus.    [name] is a 53 y.o. female, who is here today complaining of odorous urine and "pus" in urine. She has had symptoms since her last visit, 08/29/2018. She thinks symptoms were related to Victoza.  She is not having urine odor today.   Dysuria: Denies Urinary frequency: Mild Urinary urgency: Denies Incontinence: Denies Gross hematuria: Denies  She has not noted fever, chills, change in appetite, or unusual fatigue.  Abdominal pain: Negative   Nausea or vomiting: Negative  Abnormal vaginal bleeding or discharge: Negative She is complaining of vaginal pruritus, she thinks she may have a yeast infection. Last OV we discussed this problems,she states that she usually has vulvar pruritus, not vaginal. She follows with gyn for chronic genital pruritus, possible lichen planus. She has also seen dermatologist.   LMP:2-3 weeks.  Hx of UTI: Audry Riles time she was treated 01/2018.   Hx of nephrolithiasis. She has seen urologist, s/p cystoscopy.  OTC medications for this problem: None  DM 2, last hemoglobin A1c was not at goal, Victoza recommended. She discontinued Victoza because he thought it was causing urinary symptoms. She has not tolerated metformin in the past. She has an appointment with nutritionist.  Lab Results  Component Value Date   HGBA1C 7.3 (A) 08/29/2018    Polydipsia and polyuria. Denies polyphagia.  Last visit she was c/o respiratory symptoms,wheezing. She completed treatment with prednisone, she had a of 400 but now most are < 200's. Requesting referral to endocrinologist.  Review of Systems  Constitutional: Negative for activity change, appetite change, fatigue and fever.  Gastrointestinal: Negative for abdominal pain, nausea and vomiting.       No changes in bowel movements.  Endocrine: Negative  for polydipsia, polyphagia and polyuria.  Genitourinary: Positive for frequency. Negative for decreased urine volume, dysuria, hematuria, urgency, vaginal bleeding and vaginal discharge.  Musculoskeletal: Negative for back pain and myalgias.  Psychiatric/Behavioral: The patient is nervous/anxious.       Current Outpatient Medications on File Prior to Visit  Medication Sig Dispense Refill  . albuterol (PROVENTIL HFA;VENTOLIN HFA) 108 (90 Base) MCG/ACT inhaler Inhale 2 puffs into the lungs every 6 (six) hours as needed for wheezing or shortness of breath. 1 Inhaler 0  . hydrocortisone (ANUSOL-HC) 25 MG suppository Place 25 mg rectally 2 (two) times daily as needed for hemorrhoids.     . hydrOXYzine (ATARAX/VISTARIL) 25 MG tablet Take 1 tablet (25 mg total) by mouth 3 (three) times daily as needed for itching. 30 tablet 1  . ibuprofen (ADVIL,MOTRIN) 800 MG tablet Take 1 tablet (800 mg total) by mouth every 8 (eight) hours as needed. 30 tablet 1  . Insulin Pen Needle 29G X 12.7MM MISC Use as directed 90 each 2  . nystatin (MYCOSTATIN/NYSTOP) powder Apply topically 3 (three) times daily as needed. 45 g 1  . nystatin cream (MYCOSTATIN) Apply 1 application topically 2 (two) times daily. 30 g 1  . PROCTOZONE-HC 2.5 % rectal cream USE TWICE DAILY FOR 1 WEEK, THEN ONCE A DAY FOR 1 WEEK , THEN EVERY OTHER DAY FOR ONE WEEK 28 g 0  . triamcinolone ointment (KENALOG) 0.5 % APPLY A PEA SIZED AMOUNT OF OINTMENT TOPICALLY TO AFFECTED AREA TWICE DAILY FOR 1 TO 2 WEEKS. **NOT FOR DAILY LONG TERM USE** At most use 1-2 x a  week for maintenance 30 g 1  . Estradiol 10 MCG TABS vaginal tablet Place 1 tablet (10 mcg total) vaginally 2 (two) times a week. (Patient not taking: Reported on 09/09/2018) 24 tablet 3  . liraglutide (VICTOZA) 18 MG/3ML SOPN Start with 0.6 mg daily and increase to 1.2 in 3 weeks, then increase to 1.8 if well tolerated. (Patient not taking: Reported on 09/09/2018) 3 pen 3   No current  facility-administered medications on file prior to visit.      Past Medical History:  Diagnosis Date  . Anemia   . Anxiety    panic attacks  . Arthritis   . Asthma   . Complication of anesthesia    difficulty breathing for a few a minutes after a procedure  . Difficult intubation    pt unsure   . Environmental and seasonal allergies   . Family history of adverse reaction to anesthesia    " my son hard a hard time waking up "  . GERD (gastroesophageal reflux disease)   . Headache   . Helicobacter pylori gastritis   . Hernia, abdominal   . History of kidney stones   . Irregular heartbeat   . Pneumonia   . Recurrent sinus infections   . Type II diabetes mellitus (HCC)    Allergies  Allergen Reactions  . Adhesive [Tape] Hives    And Band Aids  . Fish Allergy Shortness Of Breath and Itching  . Latex Hives  . Peanut Oil   . Sulfa Antibiotics Hives    Social History   Socioeconomic History  . Marital status: Married    Spouse name: Not on file  . Number of children: Not on file  . Years of education: Not on file  . Highest education level: Not on file  Occupational History  . Not on file  Social Needs  . Financial resource strain: Not on file  . Food insecurity:    Worry: Not on file    Inability: Not on file  . Transportation needs:    Medical: Not on file    Non-medical: Not on file  Tobacco Use  . Smoking status: Never Smoker  . Smokeless tobacco: Never Used  Substance and Sexual Activity  . Alcohol use: Yes    Comment: occ  . Drug use: No  . Sexual activity: Not Currently    Partners: Male    Birth control/protection: None  Lifestyle  . Physical activity:    Days per week: Not on file    Minutes per session: Not on file  . Stress: Not on file  Relationships  . Social connections:    Talks on phone: Not on file    Gets together: Not on file    Attends religious service: Not on file    Active member of club or organization: Not on file     Attends meetings of clubs or organizations: Not on file    Relationship status: Not on file  Other Topics Concern  . Not on file  Social History Narrative  . Not on file    Vitals:   09/09/18 0700  BP: 124/80  Pulse: 86  Resp: 12  Temp: 98.1 F (36.7 C)   Body mass index is 51.29 kg/m.    Physical Exam  Nursing note and vitals reviewed. Constitutional: She is oriented to person, place, and time. She appears well-developed. No distress.  HENT:  Head: Normocephalic and atraumatic.  Mouth/Throat: Oropharynx is clear and moist and mucous  membranes are normal.  Eyes: Conjunctivae are normal.  Cardiovascular: Normal rate and regular rhythm.  Respiratory: Effort normal and breath sounds normal. No respiratory distress.  GI: Soft. She exhibits no mass. There is no abdominal tenderness. There is no CVA tenderness.  Genitourinary:    Genitourinary Comments: Deferred to gyn.   Musculoskeletal:        General: No edema.  Lymphadenopathy:    She has no cervical adenopathy.  Neurological: She is alert and oriented to person, place, and time.  Skin: Skin is warm. No erythema.  Psychiatric: Her speech is normal. Her mood appears anxious.  Well groomed,good eye contact.      ASSESSMENT AND PLAN:  Ms. Kimyetta was seen today for abnormal urine.  Diagnoses and all orders for this visit:  Abnormal urine odor Urine dipstick today negative. We discussed possible causes. Very anxious about possible UTI,urine sent for culture.  -     POCT Urinalysis Dipstick (Automated) -     Culture, Urine  Uncontrolled type 2 diabetes mellitus with hyperglycemia, without long-term current use of insulin ([place]) Possible complications of DM discussed. She does not want to continue Victoza. She has not tolerated Metformin before,diarrhea. Endocrinology referral placed as requested.  -     Basic metabolic panel -     Ambulatory referral to Endocrinology  Vaginal pruritus Possible etiologies  discussed. She has had similar concerned before but states that this is different. Empiric treatment for candidiasis. Recommend following with gyn if persistent symptoms.  -     terconazole (TERAZOL 7) 0.4 % vaginal cream; Place 1 applicator vaginally at bedtime for 10 days. -     fluconazole (DIFLUCAN) 150 MG tablet; Take 1 tablet (150 mg total) by mouth once for 1 dose.    Return if symptoms worsen or fail to improve.     Betty G. Martinique, MD  [facility]. [place] office.

## 2018-09-09 NOTE — Assessment & Plan Note (Signed)
She is not interested in pharmacologic treatment. Planning on keeping appointment with nutritionist. We discussed possible complications of poorly controlled DM 2. Endocrinology referral placed.

## 2018-09-10 LAB — URINE CULTURE
MICRO NUMBER:: 83480
SPECIMEN QUALITY:: ADEQUATE

## 2018-09-11 ENCOUNTER — Encounter: Payer: Self-pay | Admitting: Family Medicine

## 2018-09-22 ENCOUNTER — Ambulatory Visit: Payer: Self-pay | Admitting: Obstetrics and Gynecology

## 2018-09-24 ENCOUNTER — Ambulatory Visit: Payer: 59 | Admitting: *Deleted

## 2018-09-30 ENCOUNTER — Encounter: Payer: Self-pay | Admitting: Obstetrics and Gynecology

## 2018-09-30 ENCOUNTER — Other Ambulatory Visit (HOSPITAL_COMMUNITY)
Admission: RE | Admit: 2018-09-30 | Discharge: 2018-09-30 | Disposition: A | Discharge status: Home / Self Care | Payer: 59 | Source: Ambulatory Visit | Attending: Obstetrics and Gynecology | Admitting: Obstetrics and Gynecology

## 2018-09-30 ENCOUNTER — Other Ambulatory Visit: Payer: Self-pay

## 2018-09-30 ENCOUNTER — Ambulatory Visit: Payer: Self-pay | Admitting: Obstetrics and Gynecology

## 2018-09-30 ENCOUNTER — Ambulatory Visit: Payer: 59 | Admitting: Registered"

## 2018-09-30 ENCOUNTER — Ambulatory Visit: Payer: 59 | Admitting: Obstetrics and Gynecology

## 2018-09-30 VITALS — BP 130/64 | HR 66 | Resp 14 | Ht 65.75 in | Wt 316.0 lb

## 2018-09-30 DIAGNOSIS — N3946 Mixed incontinence: Secondary | ICD-10-CM

## 2018-09-30 DIAGNOSIS — Z23 Encounter for immunization: Secondary | ICD-10-CM

## 2018-09-30 DIAGNOSIS — Z01419 Encounter for gynecological examination (general) (routine) without abnormal findings: Secondary | ICD-10-CM | POA: Diagnosis not present

## 2018-09-30 DIAGNOSIS — R350 Frequency of micturition: Secondary | ICD-10-CM | POA: Diagnosis not present

## 2018-09-30 DIAGNOSIS — Z01411 Encounter for gynecological examination (general) (routine) with abnormal findings: Secondary | ICD-10-CM | POA: Diagnosis not present

## 2018-09-30 DIAGNOSIS — R109 Unspecified abdominal pain: Secondary | ICD-10-CM

## 2018-09-30 DIAGNOSIS — R102 Pelvic and perineal pain: Secondary | ICD-10-CM

## 2018-09-30 DIAGNOSIS — Z124 Encounter for screening for malignant neoplasm of cervix: Secondary | ICD-10-CM

## 2018-09-30 DIAGNOSIS — R3915 Urgency of urination: Secondary | ICD-10-CM | POA: Diagnosis not present

## 2018-09-30 DIAGNOSIS — N951 Menopausal and female climacteric states: Secondary | ICD-10-CM | POA: Diagnosis not present

## 2018-09-30 NOTE — Patient Instructions (Signed)
EXERCISE AND DIET:  We recommended that you start or continue a regular exercise program for good health. Regular exercise means any activity that makes your heart beat faster and makes you sweat.  We recommend exercising at least 30 minutes per day at least 3 days a week, preferably 4 or 5.  We also recommend a diet low in fat and sugar.  Inactivity, poor dietary choices and obesity can cause diabetes, heart attack, stroke, and kidney damage, among others.    ALCOHOL AND SMOKING:  Women should limit their alcohol intake to no more than 7 drinks/beers/glasses of wine (combined, not each!) per week. Moderation of alcohol intake to this level decreases your risk of breast cancer and liver damage. And of course, no recreational drugs are part of a healthy lifestyle.  And absolutely no smoking or even second hand smoke. Most people know smoking can cause heart and lung diseases, but did you know it also contributes to weakening of your bones? Aging of your skin?  Yellowing of your teeth and nails?  CALCIUM AND VITAMIN D:  Adequate intake of calcium and Vitamin D are recommended.  The recommendations for exact amounts of these supplements seem to change often, but generally speaking 1,200 mg of calcium (between diet and supplement) and 800 units of Vitamin D per day seems prudent. Certain women may benefit from higher intake of Vitamin D.  If you are among these women, your doctor will have told you during your visit.    PAP SMEARS:  Pap smears, to check for cervical cancer or precancers,  have traditionally been done yearly, although recent scientific advances have shown that most women can have pap smears less often.  However, every woman still should have a physical exam from her gynecologist every year. It will include a breast check, inspection of the vulva and vagina to check for abnormal growths or skin changes, a visual exam of the cervix, and then an exam to evaluate the size and shape of the uterus and  ovaries.  And after 52 years of age, a rectal exam is indicated to check for rectal cancers. We will also provide age appropriate advice regarding health maintenance, like when you should have certain vaccines, screening for sexually transmitted diseases, bone density testing, colonoscopy, mammograms, etc.   MAMMOGRAMS:  All women over 40 years old should have a yearly mammogram. Many facilities now offer a "3D" mammogram, which may cost around $50 extra out of pocket. If possible,  we recommend you accept the option to have the 3D mammogram performed.  It both reduces the number of women who will be called back for extra views which then turn out to be normal, and it is better than the routine mammogram at detecting truly abnormal areas.    COLON CANCER SCREENING: Now recommend starting at age 45. At this time colonoscopy is not covered for routine screening until 50. There are take home tests that can be done between 45-49.   COLONOSCOPY:  Colonoscopy to screen for colon cancer is recommended for all women at age 50.  We know, you hate the idea of the prep.  We agree, BUT, having colon cancer and not knowing it is worse!!  Colon cancer so often starts as a polyp that can be seen and removed at colonscopy, which can quite literally save your life!  And if your first colonoscopy is normal and you have no family history of colon cancer, most women don't have to have it again for   10 years.  Once every ten years, you can do something that may end up saving your life, right?  We will be happy to help you get it scheduled when you are ready.  Be sure to check your insurance coverage so you understand how much it will cost.  It may be covered as a preventative service at no cost, but you should check your particular policy.      Breast Self-Awareness Breast self-awareness means being familiar with how your breasts look and feel. It involves checking your breasts regularly and reporting any changes to your  health care provider. Practicing breast self-awareness is important. A change in your breasts can be a sign of a serious medical problem. Being familiar with how your breasts look and feel allows you to find any problems early, when treatment is more likely to be successful. All women should practice breast self-awareness, including women who have had breast implants. How to do a breast self-exam One way to learn what is normal for your breasts and whether your breasts are changing is to do a breast self-exam. To do a breast self-exam: Look for Changes  1. Remove all the clothing above your waist. 2. Stand in front of a mirror in a room with good lighting. 3. Put your hands on your hips. 4. Push your hands firmly downward. 5. Compare your breasts in the mirror. Look for differences between them (asymmetry), such as: ? Differences in shape. ? Differences in size. ? Puckers, dips, and bumps in one breast and not the other. 6. Look at each breast for changes in your skin, such as: ? Redness. ? Scaly areas. 7. Look for changes in your nipples, such as: ? Discharge. ? Bleeding. ? Dimpling. ? Redness. ? A change in position. Feel for Changes Carefully feel your breasts for lumps and changes. It is best to do this while lying on your back on the floor and again while sitting or standing in the shower or tub with soapy water on your skin. Feel each breast in the following way:  Place the arm on the side of the breast you are examining above your head.  Feel your breast with the other hand.  Start in the nipple area and make  inch (2 cm) overlapping circles to feel your breast. Use the pads of your three middle fingers to do this. Apply light pressure, then medium pressure, then firm pressure. The light pressure will allow you to feel the tissue closest to the skin. The medium pressure will allow you to feel the tissue that is a little deeper. The firm pressure will allow you to feel the tissue  close to the ribs.  Continue the overlapping circles, moving downward over the breast until you feel your ribs below your breast.  Move one finger-width toward the center of the body. Continue to use the  inch (2 cm) overlapping circles to feel your breast as you move slowly up toward your collarbone.  Continue the up and down exam using all three pressures until you reach your armpit.  Write Down What You Find  Write down what is normal for each breast and any changes that you find. Keep a written record with breast changes or normal findings for each breast. By writing this information down, you do not need to depend only on memory for size, tenderness, or location. Write down where you are in your menstrual cycle, if you are still menstruating. If you are having trouble noticing differences   in your breasts, do not get discouraged. With time you will become more familiar with the variations in your breasts and more comfortable with the exam. How often should I examine my breasts? Examine your breasts every month. If you are breastfeeding, the best time to examine your breasts is after a feeding or after using a breast pump. If you menstruate, the best time to examine your breasts is 5-7 days after your period is over. During your period, your breasts are lumpier, and it may be more difficult to notice changes. When should I see my health care provider? See your health care provider if you notice:  A change in shape or size of your breasts or nipples.  A change in the skin of your breast or nipples, such as a reddened or scaly area.  Unusual discharge from your nipples.  A lump or thick area that was not there before.  Pain in your breasts.  Anything that concerns you.  

## 2018-09-30 NOTE — Progress Notes (Signed)
53 y.o. [id number] Married Black or [place] American Not Hispanic or Latino female here for annual exam.   She has had issues with intermittent vulvitis. She feels her vulva and vagina are dry.  Her cycles have spaced out. She hasn't had a cycle for months, LMP in 11/19, prior to that was months.  Not sexually active, too many issues with her vulva and his prostate.     She c/o a 6 month h/o intermittent sharp pain in her LLQ abdomen/pelvis. Worse in the last month. Occurring at least 1-2 x a week, pain lasts for 6-7 seconds. Normal BM. Some urinary frequency, she has some GSI.  Urinary urgency, some urge incontinence.   H/O DM, recent HgbA1C was 7.3%  No LMP recorded.          Sexually active: No.  The current method of family planning is abstinence.    Exercising: Yes.    patient walks  Smoker:  no  Health Maintenance: Pap:  09/19/17 + LSIL - HR HPV  History of abnormal Pap:  yes MMG:  07/24/18 Density B Bi-rads 1 neg  BMD:    Colonoscopy: 3/18 WNL, f/u in 5 years TDaP:  unsure Gardasil: NA   reports that she has never smoked. She has never used smokeless tobacco. She reports current alcohol use. She reports that she does not use drugs. 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 2 grown sons. 40 year old niece lives with her (multiple mental health issues), very stressful. She has a 23 and 53 year old grandchildren. Has a 52 year old mixed breed dog, Callie (part pit bull), who is her baby.   Past Medical History:  Diagnosis Date  . Anemia   . Anxiety    panic attacks  . Arthritis   . Asthma   . Complication of anesthesia    difficulty breathing for a few a minutes after a procedure  . Difficult intubation    pt unsure   . Environmental and seasonal allergies   . Family history of adverse reaction to anesthesia    " my son hard a hard time waking up "  . GERD (gastroesophageal reflux disease)   . Headache   . Helicobacter pylori gastritis   . Hernia, abdominal   . History of  kidney stones   . Irregular heartbeat   . Pneumonia   . Recurrent sinus infections   . Type II diabetes mellitus ([place])     Past Surgical History:  Procedure Laterality Date  . APPENDECTOMY    . BIOPSY ENDOMETRIAL    . COLONOSCOPY W/ BIOPSIES AND POLYPECTOMY    . ESOPHAGOGASTRODUODENOSCOPY (EGD) WITH PROPOFOL N/A 11/26/2016   Procedure: ESOPHAGOGASTRODUODENOSCOPY (EGD) WITH PROPOFOL;  Surgeon: Otis Brace, MD;  Location: [facility];  Service: Gastroenterology;  Laterality: N/A;  . ROTATOR CUFF REPAIR      Current Outpatient Medications  Medication Sig Dispense Refill  . albuterol (PROVENTIL HFA;VENTOLIN HFA) 108 (90 Base) MCG/ACT inhaler Inhale 2 puffs into the lungs every 6 (six) hours as needed for wheezing or shortness of breath. 1 Inhaler 0  . Estradiol 10 MCG TABS vaginal tablet Place 1 tablet (10 mcg total) vaginally 2 (two) times a week. (Patient not taking: Reported on 09/09/2018) 24 tablet 3  . hydrocortisone (ANUSOL-HC) 25 MG suppository Place 25 mg rectally 2 (two) times daily as needed for hemorrhoids.     . hydrOXYzine (ATARAX/VISTARIL) 25 MG tablet Take 1 tablet (25 mg total) by mouth 3 (three) times  daily as needed for itching. 30 tablet 1  . ibuprofen (ADVIL,MOTRIN) 800 MG tablet Take 1 tablet (800 mg total) by mouth every 8 (eight) hours as needed. 30 tablet 1  . Insulin Pen Needle 29G X 12.7MM MISC Use as directed 90 each 2  . liraglutide (VICTOZA) 18 MG/3ML SOPN Start with 0.6 mg daily and increase to 1.2 in 3 weeks, then increase to 1.8 if well tolerated. (Patient not taking: Reported on 09/09/2018) 3 pen 3  . nystatin (MYCOSTATIN/NYSTOP) powder Apply topically 3 (three) times daily as needed. 45 g 1  . nystatin cream (MYCOSTATIN) Apply 1 application topically 2 (two) times daily. 30 g 1  . PROCTOZONE-HC 2.5 % rectal cream USE TWICE DAILY FOR 1 WEEK, THEN ONCE A DAY FOR 1 WEEK , THEN EVERY OTHER DAY FOR ONE WEEK 28 g 0  . triamcinolone ointment (KENALOG) 0.5 % APPLY  A PEA SIZED AMOUNT OF OINTMENT TOPICALLY TO AFFECTED AREA TWICE DAILY FOR 1 TO 2 WEEKS. **NOT FOR DAILY LONG TERM USE** At most use 1-2 x a week for maintenance 30 g 1   No current facility-administered medications for this visit.     Family History  Problem Relation Age of Onset  . Diabetes Mother   . Heart attack Mother   . Colon cancer Father   . Cervical cancer Sister   . Diabetes Sister   . Heart attack Sister   . Epilepsy Brother     Review of Systems  All other systems reviewed and are negative.   Exam:   There were no vitals taken for this visit.  Weight change: @WEIGHTCHANGE @ Height:      Ht Readings from Last 3 Encounters:  09/09/18 5' 5.5" (1.664 m)  08/29/18 5' 5.5" (1.664 m)  02/14/18 5' 5.5" (1.664 m)    General appearance: alert, cooperative and appears stated age Head: Normocephalic, without obvious abnormality, atraumatic Neck: no adenopathy, supple, symmetrical, trachea midline and thyroid normal to inspection and palpation Lungs: clear to auscultation bilaterally Cardiovascular: regular rate and rhythm Breasts: normal appearance, no masses or tenderness Abdomen: soft, mildly tender LLQ, no rebound, no guarding; non distended,  no masses,  no organomegaly Extremities: extremities normal, atraumatic, no cyanosis or edema Skin: Skin color, texture, turgor normal. No rashes or lesions Lymph nodes: Cervical, supraclavicular, and axillary nodes normal. No abnormal inguinal nodes palpated Neurologic: Grossly normal   Pelvic: External genitalia:  no lesions              Urethra:  normal appearing urethra with no masses, tenderness or lesions              Bartholins and Skenes: normal                 Vagina: normal appearing vagina with normal color and discharge, no lesions              Cervix: no lesions               Bimanual Exam:  Uterus:  anteverted, mobile, not appreciably enlarged, not tender              Adnexa: no masses, some tenderness in the  left adnexa               Rectovaginal: Confirms               Anus:  normal sphincter tone, no lesions  Chaperone was present for exam.  A:  Well Woman with normal  exam  Abdominal/pelvic pain  H/O LSIL pap in 1/19  DM, not controlled  Mixed urinary incontinence, frequency, urgency (she does drink caffeine)  Under lots of stress with her mentally ill niece who is currently living with her.  P:   Pap with hpv  Mammogram UTD  Colonoscopy UTD  Discussed breast self exam  Discussed calcium and vit D intake  Labs with primary MD  Return for ultrasound  Urine for ua, c&s  Name of counselor given  TDAP today

## 2018-10-01 LAB — URINALYSIS, MICROSCOPIC ONLY
Bacteria, UA: NONE SEEN
Casts: NONE SEEN /lpf

## 2018-10-01 LAB — URINE CULTURE

## 2018-10-02 LAB — CYTOLOGY - PAP
Diagnosis: UNDETERMINED — AB
HPV: NOT DETECTED

## 2018-10-07 ENCOUNTER — Telehealth: Payer: Self-pay | Admitting: *Deleted

## 2018-10-07 DIAGNOSIS — R87610 Atypical squamous cells of undetermined significance on cytologic smear of cervix (ASC-US): Secondary | ICD-10-CM

## 2018-10-07 NOTE — Telephone Encounter (Signed)
-----   Message from Salvadore Dom, MD sent at 10/02/2018  5:39 PM EST ----- Please let the patient know that her pap returned as ASCUS, negative HPV. Last year she had a LSIL pap, because of the pap last year with the ASCUS pap now she needs a colposcopy. Please set her up.

## 2018-10-07 NOTE — Telephone Encounter (Signed)
Notes recorded by Burnice Logan, RN on 10/07/2018 at 11:08 AM EST Left message to call Sharee Pimple, RN at [facility].

## 2018-10-07 NOTE — Telephone Encounter (Signed)
Yes, it's fine

## 2018-10-07 NOTE — Telephone Encounter (Signed)
Spoke with patient, advised as seen below per Dr. Talbert Nan. Patient is perimenopausal, LMP 06/2018. Abstinence for contraception. Patient is returning for PUS on 2/25. Brief explanation of colpo provided. Advised to continue to abstain from intercourse, colpo scheduled for 3/6 at 1:30pm with Dr. Nelson Chimes. Order placed for precert. Advised to take Motrin 800 mg with food and water one hour before procedure. I will review with Dr. Nelson Chimes and return call if any additional recommendations. Patient agreeable.    Dr. Talbert Nan -patient is perimenopausal, ok to proceed with colpo as scheduled?   Cc: Magdalene Patricia, [address] [employer]

## 2018-10-08 NOTE — Telephone Encounter (Signed)
Encounter closed

## 2018-10-13 DIAGNOSIS — Z79899 Other long term (current) drug therapy: Secondary | ICD-10-CM | POA: Diagnosis not present

## 2018-10-13 DIAGNOSIS — L249 Irritant contact dermatitis, unspecified cause: Secondary | ICD-10-CM | POA: Diagnosis not present

## 2018-10-13 DIAGNOSIS — D485 Neoplasm of uncertain behavior of skin: Secondary | ICD-10-CM | POA: Diagnosis not present

## 2018-10-13 DIAGNOSIS — L280 Lichen simplex chronicus: Secondary | ICD-10-CM | POA: Diagnosis not present

## 2018-10-13 DIAGNOSIS — L299 Pruritus, unspecified: Secondary | ICD-10-CM | POA: Diagnosis not present

## 2018-10-13 NOTE — Progress Notes (Signed)
GYNECOLOGY  VISIT   HPI: 53 y.o.   Married Black or [place] American Not Hispanic or Latino  female   [phone] with No LMP recorded (lmp unknown).   here for consult following PUS. The patient has a 6 month h/o intermittent sharp pain in her abdomen and pelvis.     GYNECOLOGIC HISTORY: No LMP recorded (lmp unknown). Contraception: Abstinence Menopausal hormone therapy: None        OB History    Gravida  3   Para  2   Term  2   Preterm      AB  1   Living  2     SAB      TAB      Ectopic      Multiple      Live Births  2              Patient Active Problem List   Diagnosis Date Noted  . Vulvovaginal pruritus 08/29/2018  . Eczema 04/15/2017  . Uncontrolled type 2 diabetes mellitus with hyperglycemia, without long-term current use of insulin ([place]) 12/13/2016  . Morbid obesity with BMI of 50.0-59.9, adult ([place]) 12/10/2016  . Allergic rhinitis 12/10/2016    Past Medical History:  Diagnosis Date  . Anemia   . Anxiety    panic attacks  . Arthritis   . Asthma   . Complication of anesthesia    difficulty breathing for a few a minutes after a procedure  . Difficult intubation    pt unsure   . Environmental and seasonal allergies   . Family history of adverse reaction to anesthesia    " my son hard a hard time waking up "  . GERD (gastroesophageal reflux disease)   . Headache   . Helicobacter pylori gastritis   . Hernia, abdominal   . History of kidney stones   . Irregular heartbeat   . Pneumonia   . Recurrent sinus infections   . Type II diabetes mellitus ([place])     Past Surgical History:  Procedure Laterality Date  . APPENDECTOMY    . BIOPSY ENDOMETRIAL    . COLONOSCOPY W/ BIOPSIES AND POLYPECTOMY    . ESOPHAGOGASTRODUODENOSCOPY (EGD) WITH PROPOFOL N/A 11/26/2016   Procedure: ESOPHAGOGASTRODUODENOSCOPY (EGD) WITH PROPOFOL;  Surgeon: Otis Brace, MD;  Location: [facility];  Service: Gastroenterology;  Laterality: N/A;  . ROTATOR CUFF REPAIR       Current Outpatient Medications  Medication Sig Dispense Refill  . albuterol (PROVENTIL HFA;VENTOLIN HFA) 108 (90 Base) MCG/ACT inhaler Inhale 2 puffs into the lungs every 6 (six) hours as needed for wheezing or shortness of breath. 1 Inhaler 0  . Estradiol 10 MCG TABS vaginal tablet Place 1 tablet (10 mcg total) vaginally 2 (two) times a week. 24 tablet 3  . hydrocortisone (ANUSOL-HC) 25 MG suppository Place 25 mg rectally 2 (two) times daily as needed for hemorrhoids.     . hydrOXYzine (ATARAX/VISTARIL) 25 MG tablet Take 1 tablet (25 mg total) by mouth 3 (three) times daily as needed for itching. 30 tablet 1  . ibuprofen (ADVIL,MOTRIN) 800 MG tablet Take 1 tablet (800 mg total) by mouth every 8 (eight) hours as needed. 30 tablet 1  . nystatin (MYCOSTATIN/NYSTOP) powder Apply topically 3 (three) times daily as needed. 45 g 1  . PROCTOZONE-HC 2.5 % rectal cream USE TWICE DAILY FOR 1 WEEK, THEN ONCE A DAY FOR 1 WEEK , THEN EVERY OTHER DAY FOR ONE WEEK 28 g 0  . triamcinolone  ointment (KENALOG) 0.5 % APPLY A PEA SIZED AMOUNT OF OINTMENT TOPICALLY TO AFFECTED AREA TWICE DAILY FOR 1 TO 2 WEEKS. **NOT FOR DAILY LONG TERM USE** At most use 1-2 x a week for maintenance 30 g 1  . Insulin Pen Needle 29G X 12.7MM MISC Use as directed (Patient not taking: Reported on 10/14/2018) 90 each 2  . nystatin cream (MYCOSTATIN) Apply 1 application topically 2 (two) times daily. (Patient not taking: Reported on 10/14/2018) 30 g 1   No current facility-administered medications for this visit.      ALLERGIES: Adhesive [tape]; Fish allergy; Latex; Peanut oil; and Sulfa antibiotics  Family History  Problem Relation Age of Onset  . Diabetes Mother   . Heart attack Mother   . Colon cancer Father   . Cervical cancer Sister   . Diabetes Sister   . Heart attack Sister   . Epilepsy Brother     Social History   Socioeconomic History  . Marital status: Married    Spouse name: Not on file  . Number of children:  Not on file  . Years of education: Not on file  . Highest education level: Not on file  Occupational History  . Not on file  Social Needs  . Financial resource strain: Not on file  . Food insecurity:    Worry: Not on file    Inability: Not on file  . Transportation needs:    Medical: Not on file    Non-medical: Not on file  Tobacco Use  . Smoking status: Never Smoker  . Smokeless tobacco: Never Used  Substance and Sexual Activity  . Alcohol use: Yes    Comment: occ  . Drug use: No  . Sexual activity: Not Currently    Partners: Male    Birth control/protection: None  Lifestyle  . Physical activity:    Days per week: Not on file    Minutes per session: Not on file  . Stress: Not on file  Relationships  . Social connections:    Talks on phone: Not on file    Gets together: Not on file    Attends religious service: Not on file    Active member of club or organization: Not on file    Attends meetings of clubs or organizations: Not on file    Relationship status: Not on file  . Intimate partner violence:    Fear of current or ex partner: Not on file    Emotionally abused: Not on file    Physically abused: Not on file    Forced sexual activity: Not on file  Other Topics Concern  . Not on file  Social History Narrative  . Not on file    Review of Systems  Constitutional: Negative.   HENT: Positive for congestion.   Eyes: Negative.   Respiratory: Negative.   Cardiovascular: Positive for leg swelling.  Gastrointestinal:       Bloating  Genitourinary: Positive for frequency.       Nocturia Loss of urine with cough/sneeze  Musculoskeletal:       Joint pain  Skin: Positive for itching and rash.       Hair loss  Neurological: Negative.   Endo/Heme/Allergies:       Excessive thirst Heat and cold intolerance Swollen glands in back of neck Craving ice  Psychiatric/Behavioral: Positive for memory loss.    PHYSICAL EXAMINATION:    BP 132/82 (BP Location: Right  Arm, Patient Position: Sitting, Cuff Size: Large)  Pulse 84   Wt (!) 309 lb 9.6 oz (140.4 kg)   LMP  (LMP Unknown) Comment: 06/2018  BMI 50.35 kg/m     General appearance: alert, cooperative and appears stated age  ASSESSMENT Intermittent abdominal/pelvic pain, negative gyn ultrasound Perimenopausal, thin endometrium    PLAN Patient reassured F/U with primary for further evaluation of pain Call with any concerns.     An After Visit Summary was printed and given to the patient.

## 2018-10-14 ENCOUNTER — Ambulatory Visit: Payer: 59 | Admitting: Obstetrics and Gynecology

## 2018-10-14 ENCOUNTER — Ambulatory Visit (INDEPENDENT_AMBULATORY_CARE_PROVIDER_SITE_OTHER): Payer: 59

## 2018-10-14 ENCOUNTER — Encounter: Payer: Self-pay | Admitting: Obstetrics and Gynecology

## 2018-10-14 ENCOUNTER — Other Ambulatory Visit: Payer: Self-pay

## 2018-10-14 VITALS — BP 132/82 | HR 84 | Wt 309.6 lb

## 2018-10-14 DIAGNOSIS — R109 Unspecified abdominal pain: Secondary | ICD-10-CM

## 2018-10-14 DIAGNOSIS — R350 Frequency of micturition: Secondary | ICD-10-CM

## 2018-10-14 DIAGNOSIS — R102 Pelvic and perineal pain: Secondary | ICD-10-CM

## 2018-10-14 LAB — POCT URINALYSIS DIPSTICK
Bilirubin, UA: NEGATIVE
Glucose, UA: NEGATIVE
Ketones, UA: NEGATIVE
Leukocytes, UA: NEGATIVE
NITRITE UA: NEGATIVE
Protein, UA: NEGATIVE
RBC UA: NEGATIVE
Spec Grav, UA: 1.01 (ref 1.010–1.025)
UROBILINOGEN UA: 0.2 U/dL
pH, UA: 5 (ref 5.0–8.0)

## 2018-10-20 ENCOUNTER — Telehealth: Payer: Self-pay | Admitting: Obstetrics and Gynecology

## 2018-10-20 NOTE — Telephone Encounter (Signed)
Spoke with patient. Patient request to reschedule colpo scheduled for 3/6 to 3/9, does not want to take additional time off from work, wants to coordinate with another appointment that day. Advised no appointment for colpo on 3/9 with Dr. Talbert Nan, reviewed alternative dates to  accommodate patients work schedule, patient declined to rescheudle. Patient states she will have to return call to office to schedule. Colpo cancelled for 10/24/18. Patient verbalizes understanding.   1 month recall placed for colpo. 09/30/18 pap ASCUS, neg hpv  Routing to provider for final review. Patient is agreeable to disposition. Will close encounter.

## 2018-10-20 NOTE — Telephone Encounter (Signed)
Patient is calling regarding rescheduling colpo appointment.

## 2018-10-21 ENCOUNTER — Telehealth: Payer: Self-pay | Admitting: Obstetrics and Gynecology

## 2018-10-21 NOTE — Telephone Encounter (Signed)
Spoke with patient, request to reschedule Colpo. Patient is perimenopausal. LMP unknown. Abstinence for contraceptive. Colpo rescheduled for 3/6 at 1pm with Dr. Talbert Nan. Patient declined appt for 3/9 at 1030 or 1100. Patient verbalizes understanding and is agreeable.   Routing to [employer].  Encounter closed.

## 2018-10-21 NOTE — Telephone Encounter (Signed)
Patient calling to reschedule colposcopy. States she would like to have it on 10/27/2018 if there is anything open that day.

## 2018-10-21 NOTE — Progress Notes (Signed)
GYNECOLOGY  VISIT   HPI: 53 y.o.   Married Black or [place] American Not Hispanic or Latino  female   [phone] with No LMP recorded (lmp unknown).   here for colposcopy.  Pap last year was LSIL with negative HPV, this year with ASCUS, negative HPV  GYNECOLOGIC HISTORY: No LMP recorded (lmp unknown). Contraception:none- abstinence  Menopausal hormone therapy: not currently using estradiol vaginal tablets        OB History    Gravida  3   Para  2   Term  2   Preterm      AB  1   Living  2     SAB      TAB      Ectopic      Multiple      Live Births  2              Patient Active Problem List   Diagnosis Date Noted  . Vulvovaginal pruritus 08/29/2018  . Eczema 04/15/2017  . Uncontrolled type 2 diabetes mellitus with hyperglycemia, without long-term current use of insulin ([place]) 12/13/2016  . Morbid obesity with BMI of 50.0-59.9, adult ([place]) 12/10/2016  . Allergic rhinitis 12/10/2016    Past Medical History:  Diagnosis Date  . Anemia   . Anxiety    panic attacks  . Arthritis   . Asthma   . Complication of anesthesia    difficulty breathing for a few a minutes after a procedure  . Difficult intubation    pt unsure   . Environmental and seasonal allergies   . Family history of adverse reaction to anesthesia    " my son hard a hard time waking up "  . GERD (gastroesophageal reflux disease)   . Headache   . Helicobacter pylori gastritis   . Hernia, abdominal   . History of kidney stones   . Irregular heartbeat   . Pneumonia   . Recurrent sinus infections   . Type II diabetes mellitus ([place])     Past Surgical History:  Procedure Laterality Date  . APPENDECTOMY    . BIOPSY ENDOMETRIAL    . COLONOSCOPY W/ BIOPSIES AND POLYPECTOMY    . ESOPHAGOGASTRODUODENOSCOPY (EGD) WITH PROPOFOL N/A 11/26/2016   Procedure: ESOPHAGOGASTRODUODENOSCOPY (EGD) WITH PROPOFOL;  Surgeon: Otis Brace, MD;  Location: [place];  Service: Gastroenterology;  Laterality:  N/A;  . ROTATOR CUFF REPAIR      Current Outpatient Medications  Medication Sig Dispense Refill  . albuterol (PROVENTIL HFA;VENTOLIN HFA) 108 (90 Base) MCG/ACT inhaler Inhale 2 puffs into the lungs every 6 (six) hours as needed for wheezing or shortness of breath. 1 Inhaler 0  . Estradiol 10 MCG TABS vaginal tablet Place 1 tablet (10 mcg total) vaginally 2 (two) times a week. 24 tablet 3  . hydrocortisone (ANUSOL-HC) 25 MG suppository Place 25 mg rectally 2 (two) times daily as needed for hemorrhoids.     . hydrOXYzine (ATARAX/VISTARIL) 25 MG tablet Take 1 tablet (25 mg total) by mouth 3 (three) times daily as needed for itching. 30 tablet 1  . ibuprofen (ADVIL,MOTRIN) 800 MG tablet Take 1 tablet (800 mg total) by mouth every 8 (eight) hours as needed. 30 tablet 1  . Insulin Pen Needle 29G X 12.7MM MISC Use as directed 90 each 2  . nystatin (MYCOSTATIN/NYSTOP) powder Apply topically 3 (three) times daily as needed. 45 g 1  . nystatin cream (MYCOSTATIN) Apply 1 application topically 2 (two) times daily. 30 g 1  .  PROCTOZONE-HC 2.5 % rectal cream USE TWICE DAILY FOR 1 WEEK, THEN ONCE A DAY FOR 1 WEEK , THEN EVERY OTHER DAY FOR ONE WEEK 28 g 0  . triamcinolone ointment (KENALOG) 0.5 % APPLY A PEA SIZED AMOUNT OF OINTMENT TOPICALLY TO AFFECTED AREA TWICE DAILY FOR 1 TO 2 WEEKS. **NOT FOR DAILY LONG TERM USE** At most use 1-2 x a week for maintenance 30 g 1   No current facility-administered medications for this visit.      ALLERGIES: Adhesive [tape]; Fish allergy; Latex; Peanut oil; and Sulfa antibiotics  Family History  Problem Relation Age of Onset  . Diabetes Mother   . Heart attack Mother   . Colon cancer Father   . Cervical cancer Sister   . Diabetes Sister   . Heart attack Sister   . Epilepsy Brother     Social History   Socioeconomic History  . Marital status: Married    Spouse name: Not on file  . Number of children: Not on file  . Years of education: Not on file  .  Highest education level: Not on file  Occupational History  . Not on file  Social Needs  . Financial resource strain: Not on file  . Food insecurity:    Worry: Not on file    Inability: Not on file  . Transportation needs:    Medical: Not on file    Non-medical: Not on file  Tobacco Use  . Smoking status: Never Smoker  . Smokeless tobacco: Never Used  Substance and Sexual Activity  . Alcohol use: Yes    Comment: occ  . Drug use: No  . Sexual activity: Not Currently    Partners: Male    Birth control/protection: None  Lifestyle  . Physical activity:    Days per week: Not on file    Minutes per session: Not on file  . Stress: Not on file  Relationships  . Social connections:    Talks on phone: Not on file    Gets together: Not on file    Attends religious service: Not on file    Active member of club or organization: Not on file    Attends meetings of clubs or organizations: Not on file    Relationship status: Not on file  . Intimate partner violence:    Fear of current or ex partner: Not on file    Emotionally abused: Not on file    Physically abused: Not on file    Forced sexual activity: Not on file  Other Topics Concern  . Not on file  Social History Narrative  . Not on file    Review of Systems  Constitutional: Negative.   HENT: Negative.   Eyes: Negative.   Respiratory: Negative.   Cardiovascular: Negative.   Gastrointestinal: Negative.   Genitourinary:       Vaginal itching  Vaginal irritation    Musculoskeletal: Negative.   Skin: Negative.   Neurological: Negative.   Endo/Heme/Allergies: Negative.   Psychiatric/Behavioral: Negative.     PHYSICAL EXAMINATION:    BP 138/86 (BP Location: Right Arm, Patient Position: Sitting, Cuff Size: Large)   Pulse 80   Resp 14   Ht 5\' 5"  (1.651 m)   Wt (!) 313 lb 6.4 oz (142.2 kg)   LMP  (LMP Unknown) Comment: 06/2018  BMI 52.15 kg/m     General appearance: alert, cooperative and appears stated  age   Pelvic: External genitalia:  no lesions  Urethra:  normal appearing urethra with no masses, tenderness or lesions              Bartholins and Skenes: normal                 Vagina: normal appearing vagina with normal color and discharge, no lesions              Cervix: no lesions  Colposcopy: satisfactory with use of a cotton swab. Minimal aceto-white changes at 3 o'clock, biopsy done. ECC done. Biopsy site treated with silver nitrate. Upper vagina inspected with acetic acid (allergy to shell fish), no abnormalities noted.  Chaperone was present for exam.  ASSESSMENT Abnormal pap, reviewed H/O vaginal atrophy, requesting refill on vaginal estrogen    PLAN Colposcopy with biopsy and ECC Further plans after results return Estradiol tab sent   An After Visit Summary was printed and given to the patient.

## 2018-10-22 ENCOUNTER — Ambulatory Visit: Payer: 59 | Admitting: Obstetrics and Gynecology

## 2018-10-24 ENCOUNTER — Ambulatory Visit: Payer: Self-pay | Admitting: Obstetrics and Gynecology

## 2018-10-24 ENCOUNTER — Ambulatory Visit: Payer: 59 | Admitting: Obstetrics and Gynecology

## 2018-10-24 ENCOUNTER — Encounter: Payer: Self-pay | Admitting: Obstetrics and Gynecology

## 2018-10-24 ENCOUNTER — Other Ambulatory Visit: Payer: Self-pay

## 2018-10-24 VITALS — BP 138/86 | HR 80 | Resp 14 | Ht 65.0 in | Wt 313.4 lb

## 2018-10-24 DIAGNOSIS — Z87410 Personal history of cervical dysplasia: Secondary | ICD-10-CM | POA: Diagnosis not present

## 2018-10-24 DIAGNOSIS — Z01812 Encounter for preprocedural laboratory examination: Secondary | ICD-10-CM | POA: Diagnosis not present

## 2018-10-24 DIAGNOSIS — R87610 Atypical squamous cells of undetermined significance on cytologic smear of cervix (ASC-US): Secondary | ICD-10-CM

## 2018-10-24 LAB — POCT URINE PREGNANCY: PREG TEST UR: NEGATIVE

## 2018-10-24 MED ORDER — ESTRADIOL 10 MCG VA TABS: 1.0000 | ORAL_TABLET | VAGINAL | 3 refills | Status: DC

## 2018-10-24 NOTE — Patient Instructions (Signed)

## 2018-10-27 DIAGNOSIS — L299 Pruritus, unspecified: Secondary | ICD-10-CM | POA: Diagnosis not present

## 2018-10-27 DIAGNOSIS — L249 Irritant contact dermatitis, unspecified cause: Secondary | ICD-10-CM | POA: Diagnosis not present

## 2018-10-27 DIAGNOSIS — B373 Candidiasis of vulva and vagina: Secondary | ICD-10-CM | POA: Diagnosis not present

## 2018-11-04 ENCOUNTER — Ambulatory Visit: Payer: 59 | Admitting: Family Medicine

## 2018-11-07 ENCOUNTER — Encounter: Payer: Self-pay | Admitting: Family Medicine

## 2018-11-07 ENCOUNTER — Other Ambulatory Visit: Payer: Self-pay

## 2018-11-07 ENCOUNTER — Ambulatory Visit: Payer: 59 | Admitting: Family Medicine

## 2018-11-07 VITALS — BP 140/80 | HR 81 | Temp 98.0°F | Resp 12 | Ht 65.0 in | Wt 309.5 lb

## 2018-11-07 DIAGNOSIS — L292 Pruritus vulvae: Secondary | ICD-10-CM | POA: Diagnosis not present

## 2018-11-07 DIAGNOSIS — E1165 Type 2 diabetes mellitus with hyperglycemia: Secondary | ICD-10-CM | POA: Diagnosis not present

## 2018-11-07 DIAGNOSIS — J309 Allergic rhinitis, unspecified: Secondary | ICD-10-CM

## 2018-11-07 DIAGNOSIS — Z6841 Body Mass Index (BMI) 40.0 and over, adult: Secondary | ICD-10-CM

## 2018-11-07 MED ORDER — LIRAGLUTIDE 18 MG/3ML ~~LOC~~ SOPN: PEN_INJECTOR | SUBCUTANEOUS | 3 refills | Status: DC

## 2018-11-07 MED ORDER — PREDNISONE 20 MG PO TABS: 40.0000 mg | ORAL_TABLET | Freq: Every day | ORAL | 0 refills | Status: AC

## 2018-11-07 MED ORDER — FLUTICASONE PROPIONATE 50 MCG/ACT NA SUSP: 2.0000 | Freq: Every day | NASAL | 6 refills | Status: AC

## 2018-11-07 MED ORDER — CETIRIZINE HCL 10 MG PO TABS: 10.0000 mg | ORAL_TABLET | Freq: Every day | ORAL | 3 refills | Status: DC

## 2018-11-07 NOTE — Assessment & Plan Note (Signed)
Explained that problem is chronic and not related to yeast infection, in which case treatment with Diflucan would not help. She does not want to try metformin again because she seems to exacerbate pruritus; but agrees with trying Victoza again.  Continue following with gynecologist and dermatologist.

## 2018-11-07 NOTE — Assessment & Plan Note (Addendum)
With acute exacerbation. I do not think respiratory symptoms are related with infectious process. Recommend continue Flonase nasal spray. Start Zyrtec 10 mg daily.  Short course of prednisone may help with acute symptoms.She will start it in 3-4 days if still symptomatic.Educated about side effects.  If symptoms are not well controlled with Zyrtec and Flonase, we can consider adding Singulair 10 mg.

## 2018-11-07 NOTE — Progress Notes (Signed)
ACUTE VISIT  HPI:  Chief Complaint  Patient presents with   Sinus drainage   Left ear itching   Allergies    [name] is a 53 y.o.female here today complaining of a couple weeks of respiratory symptoms. Initially she had diaphoresis and eye pruritus. Now having nasal congestion, rhinorrhea, postnasal drainage. + ear pruritus.  Symptoms seem to be getting worse.  Denies fever, chills, or body aches. Minimal nonproductive cough. Denies wheezing or dyspnea.  Sinus Problem  This is a recurrent problem. The current episode started in the past 7 days. The problem has been waxing and waning since onset. There has been no fever. She is experiencing no pain. Associated symptoms include congestion, coughing and sinus pressure. Pertinent negatives include no diaphoresis, ear pain, headaches, hoarse voice, shortness of breath, sore throat or swollen glands. Past treatments include saline sprays. The treatment provided mild relief.   No Hx of recent travel. No sick contact. No known insect bite.  Hx of allergies: Allergy rhinitis and eczema.  OTC medications for this problem: Sudafed.  She is also using intranasal steroid, Flonase.  In the past she has been on Zyrtec 10 mg daily, which helped control similar symptoms.   She is also concerned about DM 2. BS running high, 180's.  She has dry different medication in the past: Jardiance, which caused "yeast infection", metformin, and Victoza. She has discontinued medication because they seem to aggravate vulvar and vaginal pruritus. She follows with dermatologist and gynecologist, diagnosed with lichen sclerosus. She is requesting prescription for Diflucan to treat pruritus in case he gets worse again.  Lab Results  Component Value Date   HGBA1C 7.3 (A) 08/29/2018   She is not exercising regularly and has not been consistent with a healthful diet.  Review of Systems  Constitutional: Positive for fatigue.  Negative for activity change, appetite change, diaphoresis and fever.  HENT: Positive for congestion, postnasal drip, rhinorrhea and sinus pressure. Negative for ear pain, hoarse voice, mouth sores and sore throat.   Eyes: Positive for discharge and itching. Negative for redness.  Respiratory: Positive for cough. Negative for shortness of breath and wheezing.   Gastrointestinal: Negative for abdominal pain, diarrhea, nausea and vomiting.  Endocrine: Negative for polydipsia, polyphagia and polyuria.  Skin: Positive for rash (Eczema).  Allergic/Immunologic: Positive for environmental allergies.  Neurological: Negative for weakness and headaches.  Psychiatric/Behavioral: Negative for confusion. The patient is nervous/anxious.    Current Outpatient Medications on File Prior to Visit  Medication Sig Dispense Refill   albuterol (PROVENTIL HFA;VENTOLIN HFA) 108 (90 Base) MCG/ACT inhaler Inhale 2 puffs into the lungs every 6 (six) hours as needed for wheezing or shortness of breath. 1 Inhaler 0   doxepin (SINEQUAN) 10 MG capsule Take 1-2 tabs by mouth nightly for itching     hydrocortisone (ANUSOL-HC) 25 MG suppository Place 25 mg rectally 2 (two) times daily as needed for hemorrhoids.      hydrOXYzine (ATARAX/VISTARIL) 25 MG tablet Take 1 tablet (25 mg total) by mouth 3 (three) times daily as needed for itching. 30 tablet 1   ibuprofen (ADVIL,MOTRIN) 800 MG tablet Take 1 tablet (800 mg total) by mouth every 8 (eight) hours as needed. 30 tablet 1   nystatin (MYCOSTATIN/NYSTOP) powder Apply topically 3 (three) times daily as needed. 45 g 1   nystatin cream (MYCOSTATIN) Apply 1 application topically 2 (two) times daily. 30 g 1   PROCTOZONE-HC 2.5 % rectal cream USE TWICE DAILY FOR  1 WEEK, THEN ONCE A DAY FOR 1 WEEK , THEN EVERY OTHER DAY FOR ONE WEEK 28 g 0   triamcinolone ointment (KENALOG) 0.5 % APPLY A PEA SIZED AMOUNT OF OINTMENT TOPICALLY TO AFFECTED AREA TWICE DAILY FOR 1 TO 2 WEEKS. **NOT  FOR DAILY LONG TERM USE** At most use 1-2 x a week for maintenance 30 g 1   No current facility-administered medications on file prior to visit.      Past Medical History:  Diagnosis Date   Anemia    Anxiety    panic attacks   Arthritis    Asthma    Complication of anesthesia    difficulty breathing for a few a minutes after a procedure   Difficult intubation    pt unsure    Environmental and seasonal allergies    Family history of adverse reaction to anesthesia    " my son hard a hard time waking up "   GERD (gastroesophageal reflux disease)    Headache    Helicobacter pylori gastritis    Hernia, abdominal    History of kidney stones    Irregular heartbeat    Pneumonia    Recurrent sinus infections    Type II diabetes mellitus ([facility])    Allergies  Allergen Reactions   Adhesive [Tape] Hives    And Band Aids   Fish Allergy Shortness Of Breath and Itching   Latex Hives   Peanut Oil    Sulfa Antibiotics Hives    Social History   Socioeconomic History   Marital status: Married    Spouse name: Not on file   Number of children: Not on file   Years of education: Not on file   Highest education level: Not on file  Occupational History   Not on file  Social Needs   Financial resource strain: Not on file   Food insecurity:    Worry: Not on file    Inability: Not on file   Transportation needs:    Medical: Not on file    Non-medical: Not on file  Tobacco Use   Smoking status: Never Smoker   Smokeless tobacco: Never Used  Substance and Sexual Activity   Alcohol use: Yes    Comment: occ   Drug use: No   Sexual activity: Not Currently    Partners: Male    Birth control/protection: None  Lifestyle   Physical activity:    Days per week: Not on file    Minutes per session: Not on file   Stress: Not on file  Relationships   Social connections:    Talks on phone: Not on file    Gets together: Not on file    Attends  religious service: Not on file    Active member of club or organization: Not on file    Attends meetings of clubs or organizations: Not on file    Relationship status: Not on file  Other Topics Concern   Not on file  Social History Narrative   Not on file    Vitals:   11/07/18 1111  BP: 140/80  Pulse: 81  Resp: 12  Temp: 98 F (36.7 C)  SpO2: 97%   Body mass index is 51.5 kg/m.   Wt Readings from Last 3 Encounters:  11/07/18 (!) 309 lb 8 oz (140.4 kg)  10/24/18 (!) 313 lb 6.4 oz (142.2 kg)  10/14/18 (!) 309 lb 9.6 oz (140.4 kg)    Physical Exam  Nursing note and vitals  reviewed. Constitutional: She is oriented to person, place, and time. She appears well-developed. She does not appear ill. No distress.  HENT:  Head: Normocephalic and atraumatic.  Right Ear: Tympanic membrane, external ear and ear canal normal.  Left Ear: Tympanic membrane, external ear and ear canal normal.  Nose: Rhinorrhea present. Right sinus exhibits no maxillary sinus tenderness and no frontal sinus tenderness. Left sinus exhibits no maxillary sinus tenderness and no frontal sinus tenderness.  Mouth/Throat: Oropharynx is clear and moist and mucous membranes are normal.  Moderately hypertrophic turbinates. Nasal voice. Postnasal drainage.  Eyes: Conjunctivae are normal.  Cardiovascular: Normal rate and regular rhythm.  No murmur heard. Respiratory: Effort normal and breath sounds normal. No respiratory distress.  Lymphadenopathy:       Head (right side): No submandibular adenopathy present.       Head (left side): No submandibular adenopathy present.    She has no cervical adenopathy.  Neurological: She is alert and oriented to person, place, and time. She has normal strength.  Skin: Skin is warm. Rash noted. Rash is maculopapular (dorsal aspect of left foot.Not tender.). No erythema.  Psychiatric: She has a normal mood and affect.  Well groomed, good eye contact.   ASSESSMENT AND  PLAN:  [name] was seen today for sinus drainage, left ear itching and allergies.  Diagnoses and all orders for this visit:   Morbid obesity with BMI of 50.0-59.9, adult ([place]) Weight is otherwise stable. We discussed benefits of wt loss as well as adverse effects of obesity. Consistency with healthy diet and physical activity recommended. Victoza may also help with weight loss.  Vulvovaginal pruritus Explained that problem is chronic and not related to yeast infection, in which case treatment with Diflucan would not help. She does not want to try metformin again because she seems to exacerbate pruritus; but agrees with trying Victoza again.  Continue following with gynecologist and dermatologist.   Uncontrolled type 2 diabetes mellitus with hyperglycemia, without long-term current use of insulin ([place]) Problem is not well controlled. We discussed some complications of poorly controlled DM 2. Regular physical activity and following dietary recommendations are also important part of management,I explained. After discussion of some side effects of Victoza, she agrees with trying again.  She has Victoza at home. Recommend starting Victoza at 0.6 mg daily, she can try to increase dose to 1.2 and 1.8 as tolerated.  Allergic rhinitis With acute exacerbation. I do not think respiratory symptoms are related with infectious process. Recommend continue Flonase nasal spray. Start Zyrtec 10 mg daily.  Short course of prednisone may help with acute symptoms.She will start it in 3-4 days if still symptomatic.Educated about side effects.  If symptoms are not well controlled with Zyrtec and Flonase, we can consider adding Singulair 10 mg.    Return in about 4 months (around 03/09/2019) for dm 2.     Giovanni Bath G. [place]ique, MD  [facility]. [place] office.

## 2018-11-07 NOTE — Assessment & Plan Note (Signed)
Weight is otherwise stable. We discussed benefits of wt loss as well as adverse effects of obesity. Consistency with healthy diet and physical activity recommended. Victoza may also help with weight loss.

## 2018-11-07 NOTE — Patient Instructions (Addendum)
A few things to remember from today's visit:   Uncontrolled type 2 diabetes mellitus with hyperglycemia, without long-term current use of insulin ([facility]) - Plan: liraglutide (VICTOZA) 18 MG/3ML SOPN  Allergic rhinitis, unspecified seasonality, unspecified trigger - Plan: predniSONE (DELTASONE) 20 MG tablet  Start Victoza 0.6 mg daily and increase as tolerated to 1.8 mg.  Please be sure medication list is accurate. If a new problem present, please set up appointment sooner than planned today.

## 2018-11-07 NOTE — Assessment & Plan Note (Addendum)
Problem is not well controlled. We discussed some complications of poorly controlled DM 2. Regular physical activity and following dietary recommendations are also important part of management,I explained. After discussion of some side effects of Victoza, she agrees with trying again.  She has Victoza at home. Recommend starting Victoza at 0.6 mg daily, she can try to increase dose to 1.2 and 1.8 as tolerated.

## 2018-11-14 ENCOUNTER — Telehealth: Payer: Self-pay | Admitting: Family Medicine

## 2018-11-14 NOTE — Telephone Encounter (Signed)
Please advise on Diflucan.

## 2018-11-14 NOTE — Telephone Encounter (Signed)
Copied from [place] [id number]. Topic: Quick Communication - Rx Refill/Question >> Nov 14, 2018 11:56 AM Burchel, Abbi R wrote: Medication: liraglutide (VICTOZA) 18 MG/3ML SOPN   Pt requesting clarification on dosing instructions.  Please call pt to advise.  Pt also states she thinks this medication may cause yeast infection and is requesting Diflucan.

## 2018-11-15 ENCOUNTER — Other Ambulatory Visit: Payer: Self-pay | Admitting: Family Medicine

## 2018-11-15 DIAGNOSIS — E1165 Type 2 diabetes mellitus with hyperglycemia: Secondary | ICD-10-CM

## 2018-11-17 ENCOUNTER — Other Ambulatory Visit: Payer: Self-pay | Admitting: Family Medicine

## 2018-11-17 ENCOUNTER — Encounter: Payer: Self-pay | Admitting: *Deleted

## 2018-11-17 MED ORDER — FLUCONAZOLE 150 MG PO TABS: 150.0000 mg | ORAL_TABLET | Freq: Once | ORAL | 0 refills | Status: AC

## 2018-11-17 MED ORDER — SITAGLIPTIN PHOSPHATE 100 MG PO TABS: 100.0000 mg | ORAL_TABLET | Freq: Every day | ORAL | 1 refills | Status: DC

## 2018-11-17 NOTE — Telephone Encounter (Signed)
As we have discussed in the past, but vaginal pruritus is most likely related to lichen sclerosus, she follows with gynecologist. Diflucan 150 mg today x1 was sent to her pharmacy, again it may not help if vaginal pruritus is not related to fungal infection.  Discontinue Victoza. Januvia 100 mg to take once daily was sent to her pharmacy.  Thanks, BJ

## 2018-11-17 NOTE — Telephone Encounter (Signed)
Patient informed. 

## 2018-11-18 NOTE — Telephone Encounter (Signed)
Pt left voicemail message and states she does not want to start a new mediation and wants to stay on Victoza. Pt states itching is under control and will hold off on diflucan. Pt states she knows that on 4/0/20 she is supposed to increase her dosage to 1.2 but does not have further instructions.. Pt states that she needs to know when to change the dosage and to what strentgth. Pt states she wants to stay on medication due to itchiness being gone and states she is not trying the medication long enough. Pt states the itchiness is gone right now and she does not want to change anything. Pt requesting a new prescription of Victoza to be called in to the pharmacy and states she only has one pen left. Also see MyChart message from 11/17/18, which the pt also states she has read as well. Pt can be contacted at [phone]

## 2018-11-19 ENCOUNTER — Other Ambulatory Visit: Payer: Self-pay | Admitting: Family Medicine

## 2018-11-19 MED ORDER — LIRAGLUTIDE 18 MG/3ML ~~LOC~~ SOPN: 1.8000 mg | PEN_INJECTOR | Freq: Every day | SUBCUTANEOUS | 3 refills | Status: DC

## 2018-11-19 NOTE — Telephone Encounter (Signed)
I called the pt and informed her of the message below.  I also mailed a sheet with info on injection sites for insulin to the pts home address.

## 2018-11-19 NOTE — Telephone Encounter (Signed)
Rx for Victoza sent to her pharmacy to continue 1.8 mg [place] daily. Do not take Januvia for now. Continue following with gyn and/or dermatologist if vulvovaginal pruritus continues.  Thanks, BJ

## 2018-12-26 ENCOUNTER — Ambulatory Visit (INDEPENDENT_AMBULATORY_CARE_PROVIDER_SITE_OTHER): Payer: 59 | Admitting: Family Medicine

## 2018-12-26 ENCOUNTER — Other Ambulatory Visit: Payer: Self-pay

## 2018-12-26 ENCOUNTER — Encounter: Payer: Self-pay | Admitting: Family Medicine

## 2018-12-26 VITALS — Resp 12

## 2018-12-26 DIAGNOSIS — E1169 Type 2 diabetes mellitus with other specified complication: Secondary | ICD-10-CM | POA: Insufficient documentation

## 2018-12-26 DIAGNOSIS — D509 Iron deficiency anemia, unspecified: Secondary | ICD-10-CM

## 2018-12-26 DIAGNOSIS — E559 Vitamin D deficiency, unspecified: Secondary | ICD-10-CM | POA: Diagnosis not present

## 2018-12-26 DIAGNOSIS — E1165 Type 2 diabetes mellitus with hyperglycemia: Secondary | ICD-10-CM | POA: Diagnosis not present

## 2018-12-26 DIAGNOSIS — E785 Hyperlipidemia, unspecified: Secondary | ICD-10-CM

## 2018-12-26 DIAGNOSIS — Z6841 Body Mass Index (BMI) 40.0 and over, adult: Secondary | ICD-10-CM

## 2018-12-26 NOTE — Assessment & Plan Note (Signed)
We will hold on resuming ergocalciferol until we have [place] 25 vitamin D results. She is coming for labs next week.

## 2018-12-26 NOTE — Progress Notes (Signed)
Virtual Visit via Video Note   I connected with Tanya Houston on 12/27/18 at 12:00 PM EDT by a video enabled telemedicine application and verified that I am speaking with the correct person using two identifiers.  Location patient: home Location provider:work office Persons participating in the virtual visit: patient, provider  I discussed the limitations of evaluation and management by telemedicine and the availability of in person appointments. She expressed understanding and agreed to proceed.   HPI: Tanya Houston is a 53 yo female following today on some of her chronic medical problems. Today she would like to follow on iron def anemia and vit D deficiency.  She stopped taking Fe Sulfate because caused constipation. She has not noted headache, unusual fatigue, chest pain, dyspnea, palpitation, or pica.  Denies abdominal pain, nausea, vomiting, blood in stool or melena.  Lab Results  Component Value Date   WBC 11.7 (H) 10/07/2017   HGB 10.7 (L) 10/07/2017   HCT 34.1 (L) 10/07/2017   MCV 74.6 (L) 10/07/2017   PLT 284 10/07/2017   Vitamin D deficiency: Until a few days ago she was on ergocalciferol 50,000 units weekly. She is not sure when her last 7 [place] vitamin D was done. She states that her vitamin D was "very low."  DM 2: She started Victoza, currently taking 1.2 mg daily. She has tolerated medication well. BS when she is following dietary recommendations 110-120. BS this morning was 210, she ate ice cream last night.  Lab Results  Component Value Date   HGBA1C 7.3 (A) 08/29/2018   Lab Results  Component Value Date   MICROALBUR 3.2 (H) 02/14/2018   Since she started Victoza she has noted weight loss. She has not been consistent with following a healthful diet. She started walking a couple times per week but does not do it every week.  Hyperlipidemia: She has refused to take statins. Trying to follow a low-fat diet.  Lab Results  Component Value Date   ALT 10  04/19/2016   AST 11 (A) 04/19/2016   ALKPHOS 99 04/19/2016   BILITOT 0.3 05/31/2014   Lab Results  Component Value Date   CHOL 225 (H) 12/10/2016   HDL 55.20 12/10/2016   LDLCALC 151 (H) 12/10/2016   TRIG 93.0 12/10/2016   CHOLHDL 4 12/10/2016    ROS: See pertinent positives and negatives per HPI.  Past Medical History:  Diagnosis Date  . Anemia   . Anxiety    panic attacks  . Arthritis   . Asthma   . Complication of anesthesia    difficulty breathing for a few a minutes after a procedure  . Difficult intubation    pt unsure   . Environmental and seasonal allergies   . Family history of adverse reaction to anesthesia    " my son hard a hard time waking up "  . GERD (gastroesophageal reflux disease)   . Headache   . Helicobacter pylori gastritis   . Hernia, abdominal   . History of kidney stones   . Irregular heartbeat   . Pneumonia   . Recurrent sinus infections   . Type II diabetes mellitus ([place])     Past Surgical History:  Procedure Laterality Date  . APPENDECTOMY    . BIOPSY ENDOMETRIAL    . COLONOSCOPY W/ BIOPSIES AND POLYPECTOMY    . ESOPHAGOGASTRODUODENOSCOPY (EGD) WITH PROPOFOL N/A 11/26/2016   Procedure: ESOPHAGOGASTRODUODENOSCOPY (EGD) WITH PROPOFOL;  Surgeon: Otis Brace, MD;  Location: [facility];  Service: Gastroenterology;  Laterality:  N/A;  . ROTATOR CUFF REPAIR      Family History  Problem Relation Age of Onset  . Diabetes Mother   . Heart attack Mother   . Colon cancer Father   . Cervical cancer Sister   . Diabetes Sister   . Heart attack Sister   . Epilepsy Brother     Social History   Socioeconomic History  . Marital status: Married    Spouse name: Not on file  . Number of children: Not on file  . Years of education: Not on file  . Highest education level: Not on file  Occupational History  . Not on file  Social Needs  . Financial resource strain: Not on file  . Food insecurity:    Worry: Not on file    Inability: Not  on file  . Transportation needs:    Medical: Not on file    Non-medical: Not on file  Tobacco Use  . Smoking status: Never Smoker  . Smokeless tobacco: Never Used  Substance and Sexual Activity  . Alcohol use: Yes    Comment: occ  . Drug use: No  . Sexual activity: Not Currently    Partners: Male    Birth control/protection: None  Lifestyle  . Physical activity:    Days per week: Not on file    Minutes per session: Not on file  . Stress: Not on file  Relationships  . Social connections:    Talks on phone: Not on file    Gets together: Not on file    Attends religious service: Not on file    Active member of club or organization: Not on file    Attends meetings of clubs or organizations: Not on file    Relationship status: Not on file  . Intimate partner violence:    Fear of current or ex partner: Not on file    Emotionally abused: Not on file    Physically abused: Not on file    Forced sexual activity: Not on file  Other Topics Concern  . Not on file  Social History Narrative  . Not on file     Current Outpatient Medications:  .  albuterol (PROVENTIL HFA;VENTOLIN HFA) 108 (90 Base) MCG/ACT inhaler, Inhale 2 puffs into the lungs every 6 (six) hours as needed for wheezing or shortness of breath., Disp: 1 Inhaler, Rfl: 0 .  cetirizine (ZYRTEC) 10 MG tablet, Take 1 tablet (10 mg total) by mouth daily., Disp: 90 tablet, Rfl: 3 .  doxepin (SINEQUAN) 10 MG capsule, Take 1-2 tabs by mouth nightly for itching, Disp: , Rfl:  .  fluticasone (FLONASE) 50 MCG/ACT nasal spray, Place 2 sprays into both nostrils daily., Disp: 16 g, Rfl: 6 .  hydrocortisone (ANUSOL-HC) 25 MG suppository, Place 25 mg rectally 2 (two) times daily as needed for hemorrhoids. , Disp: , Rfl:  .  hydrOXYzine (ATARAX/VISTARIL) 25 MG tablet, Take 1 tablet (25 mg total) by mouth 3 (three) times daily as needed for itching., Disp: 30 tablet, Rfl: 1 .  ibuprofen (ADVIL,MOTRIN) 800 MG tablet, Take 1 tablet (800 mg  total) by mouth every 8 (eight) hours as needed., Disp: 30 tablet, Rfl: 1 .  liraglutide (VICTOZA) 18 MG/3ML SOPN, Inject 0.3 mLs (1.8 mg total) into the skin daily., Disp: 3 pen, Rfl: 3 .  nystatin (MYCOSTATIN/NYSTOP) powder, Apply topically 3 (three) times daily as needed., Disp: 45 g, Rfl: 1 .  nystatin cream (MYCOSTATIN), Apply 1 application topically 2 (two) times daily.,  Disp: 30 g, Rfl: 1 .  PROCTOZONE-HC 2.5 % rectal cream, USE TWICE DAILY FOR 1 WEEK, THEN ONCE A DAY FOR 1 WEEK , THEN EVERY OTHER DAY FOR ONE WEEK, Disp: 28 g, Rfl: 0 .  triamcinolone ointment (KENALOG) 0.5 %, APPLY A PEA SIZED AMOUNT OF OINTMENT TOPICALLY TO AFFECTED AREA TWICE DAILY FOR 1 TO 2 WEEKS. **NOT FOR DAILY LONG TERM USE** At most use 1-2 x a week for maintenance, Disp: 30 g, Rfl: 1  EXAM:  VITALS per patient if applicable:Resp 12   GENERAL: alert, oriented, appears well and in no acute distress  HEENT: atraumatic, conjunctiva clear,or no obvious facial abnormalities on inspection.  NECK: normal movements of the head and neck  LUNGS: on inspection no signs of respiratory distress, breathing rate appears normal, no obvious gross SOB, gasping or wheezing  CV: no obvious cyanosis  Tanya: moves all visible extremities without noticeable abnormality  PSYCH/NEURO: pleasant and cooperative, no obvious depression or anxiety, speech and thought processing grossly intact  ASSESSMENT AND PLAN:  Discussed the following assessment and plan: Orders Placed This Encounter  Procedures  . CBC  . Hemoglobin A1c  . Fructosamine  . Comprehensive metabolic panel  . Microalbumin / creatinine urine ratio  . Lipid panel  . Ferritin  . VITAMIN D 25 Hydroxy (Vit-D Deficiency, Fractures)     Uncontrolled type 2 diabetes mellitus with hyperglycemia, without long-term current use of insulin ([facility]) Problem seems to be better controlled. Instructed to increase Victoza from 1.2 mg to 1.8 mg daily. Continue dietary  recommendations. Annual eye examination and appropriate foot care. Further recommendation will be given according to A1c results.  Vitamin D deficiency, unspecified We will hold on resuming ergocalciferol until we have OH 25 vitamin D results. She is coming for labs next week.  Iron deficiency anemia She has not tolerated ferrous sulfate well, recommend trying ferrous sulfate 325 mg every other day with vitamin C. Further recommendation will be given according to lab results. Colonoscopy order was already placed by her gynecologist, appointment is pending.  Morbid obesity with BMI of 50.0-59.9, adult ([place]) Since Victoza was started she has noted weight loss. We discussed benefits of wt loss as well as adverse effects of obesity. Consistency with healthy diet and physical activity recommended.    Hyperlipidemia associated with type 2 diabetes mellitus ([place]) She has refused starting a statin. We discussed benefits of statin medications. For now continue working on low-fat diet. Recommendations will be given according to lab results.     I discussed the assessment and treatment plan with the patient. The patient was provided an opportunity to ask questions and all were answered. The patient agreed with the plan and demonstrated an understanding of the instructions.     Return in about 5 months (around 05/28/2019) for dm2,wt,hld.    Sumiye Hirth Martinique, MD

## 2018-12-26 NOTE — Assessment & Plan Note (Signed)
Since Victoza was started she has noted weight loss. We discussed benefits of wt loss as well as adverse effects of obesity. Consistency with healthy diet and physical activity recommended.

## 2018-12-26 NOTE — Assessment & Plan Note (Signed)
She has not tolerated ferrous sulfate well, recommend trying ferrous sulfate 325 mg every other day with vitamin C. Further recommendation will be given according to lab results. Colonoscopy order was already placed by her gynecologist, appointment is pending.

## 2018-12-26 NOTE — Assessment & Plan Note (Signed)
Problem seems to be better controlled. Instructed to increase Victoza from 1.2 mg to 1.8 mg daily. Continue dietary recommendations. Annual eye examination and appropriate foot care. Further recommendation will be given according to A1c results.

## 2018-12-26 NOTE — Assessment & Plan Note (Signed)
She has refused starting a statin. We discussed benefits of statin medications. For now continue working on low-fat diet. Recommendations will be given according to lab results.

## 2018-12-29 ENCOUNTER — Telehealth: Payer: Self-pay | Admitting: Family Medicine

## 2018-12-29 ENCOUNTER — Ambulatory Visit: Payer: 59 | Admitting: Family Medicine

## 2018-12-29 NOTE — Telephone Encounter (Unsigned)
Copied from [place] [id number]. Topic: Quick Communication - Rx Refill/Question >> Dec 29, 2018 11:42 AM Scherrie Gerlach wrote: Medication: predniSONE (DELTASONE) 20 MG tablet  Pt requesting refill due to itching in private area.  Pt states dr aware of her ongoing issue, pt states it is worse when it gets hot, and she can already tell it is starting.  [id number] [address], [place] [phone] (Phone) [phone] (Fax)

## 2018-12-29 NOTE — Telephone Encounter (Signed)
[  I am not treating this problem,which is chronic.] She is following with gyn for this problem. Please advise to call gyn's or dermatologist's office. Thanks, BJ

## 2018-12-29 NOTE — Telephone Encounter (Signed)
Sent to Dr. Martinique for approval

## 2018-12-30 ENCOUNTER — Ambulatory Visit (INDEPENDENT_AMBULATORY_CARE_PROVIDER_SITE_OTHER): Payer: 59 | Admitting: Family Medicine

## 2018-12-30 ENCOUNTER — Other Ambulatory Visit: Payer: Self-pay

## 2018-12-30 DIAGNOSIS — L309 Dermatitis, unspecified: Secondary | ICD-10-CM | POA: Diagnosis not present

## 2018-12-30 MED ORDER — PREDNISONE 20 MG PO TABS: ORAL_TABLET | ORAL | 0 refills | Status: DC

## 2018-12-30 NOTE — Progress Notes (Signed)
Patient ID: Tanya Houston, female   DOB: 20-Jun-1966, 53 y.o.   MRN: [id number]  This visit type was conducted due to national recommendations for restrictions regarding the COVID-19 pandemic in an effort to limit this patient's exposure and mitigate transmission in our community.   Virtual Visit via Video Note  I connected with Tanya Houston on 12/30/18 at  8:30 AM EDT by a video enabled telemedicine application and verified that I am speaking with the correct person using two identifiers.  Location patient: home Location provider:work or home office Persons participating in the virtual visit: patient, provider  I discussed the limitations of evaluation and management by telemedicine and the availability of in person appointments. The patient expressed understanding and agreed to proceed.   HPI: Patient is calling with flare regarding what has been a chronic problem which is vulvar itching.  She has seen both primary provider and GYN.  She at one point was taking topical estrogen but discontinued.  She has considered soaps and detergents that is tried many changes.  Currently using Cetaphil.  She has history of eczema.  Her itching is somewhat intermittent but tends to be very severe at times.  Back in December she had a cough and was placed on prednisone and her symptoms totally cleared with that.  She does have type 2 diabetes and is on Victoza.  Her blood sugars have improved since starting that.  She denies any vaginal discharge.  She has taken fluconazole in the past without improvement.  No history of Candida.  No douching.  No bubble baths.  She uses topical steroid almost daily which does help control symptoms but never gets rid of them.  Only thing that is helped is oral prednisone.  She also takes doxepin at night.  Her job requires lots of sitting.   ROS: See pertinent positives and negatives per HPI.  Past Medical History:  Diagnosis Date  . Anemia   . Anxiety    panic  attacks  . Arthritis   . Asthma   . Complication of anesthesia    difficulty breathing for a few a minutes after a procedure  . Difficult intubation    pt unsure   . Environmental and seasonal allergies   . Family history of adverse reaction to anesthesia    " my son hard a hard time waking up "  . GERD (gastroesophageal reflux disease)   . Headache   . Helicobacter pylori gastritis   . Hernia, abdominal   . History of kidney stones   . Irregular heartbeat   . Pneumonia   . Recurrent sinus infections   . Type II diabetes mellitus ([place])     Past Surgical History:  Procedure Laterality Date  . APPENDECTOMY    . BIOPSY ENDOMETRIAL    . COLONOSCOPY W/ BIOPSIES AND POLYPECTOMY    . ESOPHAGOGASTRODUODENOSCOPY (EGD) WITH PROPOFOL N/A 11/26/2016   Procedure: ESOPHAGOGASTRODUODENOSCOPY (EGD) WITH PROPOFOL;  Surgeon: Otis Brace, MD;  Location: [facility];  Service: Gastroenterology;  Laterality: N/A;  . ROTATOR CUFF REPAIR      Family History  Problem Relation Age of Onset  . Diabetes Mother   . Heart attack Mother   . Colon cancer Father   . Cervical cancer Sister   . Diabetes Sister   . Heart attack Sister   . Epilepsy Brother     SOCIAL HX: Non-smoker   Current Outpatient Medications:  .  albuterol (PROVENTIL HFA;VENTOLIN HFA) 108 (90 Base) MCG/ACT inhaler, Inhale 2  puffs into the lungs every 6 (six) hours as needed for wheezing or shortness of breath., Disp: 1 Inhaler, Rfl: 0 .  cetirizine (ZYRTEC) 10 MG tablet, Take 1 tablet (10 mg total) by mouth daily., Disp: 90 tablet, Rfl: 3 .  doxepin (SINEQUAN) 10 MG capsule, Take 1-2 tabs by mouth nightly for itching, Disp: , Rfl:  .  fluticasone (FLONASE) 50 MCG/ACT nasal spray, Place 2 sprays into both nostrils daily., Disp: 16 g, Rfl: 6 .  hydrocortisone (ANUSOL-HC) 25 MG suppository, Place 25 mg rectally 2 (two) times daily as needed for hemorrhoids. , Disp: , Rfl:  .  hydrOXYzine (ATARAX/VISTARIL) 25 MG tablet, Take 1  tablet (25 mg total) by mouth 3 (three) times daily as needed for itching., Disp: 30 tablet, Rfl: 1 .  ibuprofen (ADVIL,MOTRIN) 800 MG tablet, Take 1 tablet (800 mg total) by mouth every 8 (eight) hours as needed., Disp: 30 tablet, Rfl: 1 .  liraglutide (VICTOZA) 18 MG/3ML SOPN, Inject 0.3 mLs (1.8 mg total) into the skin daily., Disp: 3 pen, Rfl: 3 .  nystatin (MYCOSTATIN/NYSTOP) powder, Apply topically 3 (three) times daily as needed., Disp: 45 g, Rfl: 1 .  nystatin cream (MYCOSTATIN), Apply 1 application topically 2 (two) times daily., Disp: 30 g, Rfl: 1 .  predniSONE (DELTASONE) 20 MG tablet, Take two tablets daily for 6 days., Disp: 12 tablet, Rfl: 0 .  PROCTOZONE-HC 2.5 % rectal cream, USE TWICE DAILY FOR 1 WEEK, THEN ONCE A DAY FOR 1 WEEK , THEN EVERY OTHER DAY FOR ONE WEEK, Disp: 28 g, Rfl: 0 .  triamcinolone ointment (KENALOG) 0.5 %, APPLY A PEA SIZED AMOUNT OF OINTMENT TOPICALLY TO AFFECTED AREA TWICE DAILY FOR 1 TO 2 WEEKS. **NOT FOR DAILY LONG TERM USE** At most use 1-2 x a week for maintenance, Disp: 30 g, Rfl: 1  EXAM:  VITALS per patient if applicable:  GENERAL: alert, oriented, appears well and in no acute distress  HEENT: atraumatic, conjunttiva clear, no obvious abnormalities on inspection of external nose and ears  NECK: normal movements of the head and neck  LUNGS: on inspection no signs of respiratory distress, breathing rate appears normal, no obvious gross SOB, gasping or wheezing  CV: no obvious cyanosis  MS: moves all visible extremities without noticeable abnormality  PSYCH/NEURO: pleasant and cooperative, no obvious depression or anxiety, speech and thought processing grossly intact  ASSESSMENT AND PLAN:  Discussed the following assessment and plan:  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  She states this is the only thing that  is giving her much control.  We agreed to 1 brief 6-day course of prednisone but monitor blood sugars closely -She is reluctant to consider topical estrogen -She may be a candidate to consider topical calcineurin inhibitors which may help with sparing steroids     I discussed the assessment and treatment plan with the patient. The patient was provided an opportunity to ask questions and all were answered. The patient agreed with the plan and demonstrated an understanding of the instructions.   The patient was advised to call back or seek an in-person evaluation if the symptoms worsen or if the condition fails to improve as anticipated.     Carolann Littler, MD

## 2019-01-01 ENCOUNTER — Other Ambulatory Visit: Payer: 59

## 2019-01-08 ENCOUNTER — Other Ambulatory Visit (INDEPENDENT_AMBULATORY_CARE_PROVIDER_SITE_OTHER): Payer: 59

## 2019-01-08 ENCOUNTER — Other Ambulatory Visit: Payer: Self-pay

## 2019-01-08 DIAGNOSIS — D509 Iron deficiency anemia, unspecified: Secondary | ICD-10-CM

## 2019-01-08 DIAGNOSIS — E785 Hyperlipidemia, unspecified: Secondary | ICD-10-CM

## 2019-01-08 DIAGNOSIS — E1169 Type 2 diabetes mellitus with other specified complication: Secondary | ICD-10-CM

## 2019-01-08 DIAGNOSIS — E1165 Type 2 diabetes mellitus with hyperglycemia: Secondary | ICD-10-CM

## 2019-01-08 DIAGNOSIS — E559 Vitamin D deficiency, unspecified: Secondary | ICD-10-CM | POA: Diagnosis not present

## 2019-01-08 LAB — COMPREHENSIVE METABOLIC PANEL
ALT: 10 U/L (ref 0–35)
AST: 9 U/L (ref 0–37)
Albumin: 3.5 g/dL (ref 3.5–5.2)
Alkaline Phosphatase: 91 U/L (ref 39–117)
BUN: 9 mg/dL (ref 6–23)
CO2: 28 mEq/L (ref 19–32)
Calcium: 8.8 mg/dL (ref 8.4–10.5)
Chloride: 104 mEq/L (ref 96–112)
Creatinine, Ser: 0.82 mg/dL (ref 0.40–1.20)
GFR: 88.63 mL/min (ref >60.00)
Glucose, Bld: 137 mg/dL — ABNORMAL HIGH (ref 70–99)
Potassium: 4.3 mEq/L (ref 3.5–5.1)
Sodium: 139 mEq/L (ref 135–145)
Total Bilirubin: 0.4 mg/dL (ref 0.2–1.2)
Total Protein: 6.6 g/dL (ref 6.0–8.3)

## 2019-01-08 LAB — FERRITIN: Ferritin: 44.7 ng/mL (ref 10.0–291.0)

## 2019-01-08 LAB — CBC
HCT: 35 % — ABNORMAL LOW (ref 36.0–46.0)
Hemoglobin: 11.3 g/dL — ABNORMAL LOW (ref 12.0–15.0)
MCHC: 32.2 g/dL (ref 30.0–36.0)
MCV: 76.4 fl — ABNORMAL LOW (ref 78.0–100.0)
Platelets: 220 10*3/uL (ref 150.0–400.0)
RBC: 4.59 Mil/uL (ref 3.87–5.11)
RDW: 16.6 % — ABNORMAL HIGH (ref 11.5–15.5)
WBC: 7.1 10*3/uL (ref 4.0–10.5)

## 2019-01-08 LAB — LIPID PANEL
Cholesterol: 194 mg/dL (ref 0–200)
HDL: 53.3 mg/dL (ref >39.00)
LDL Cholesterol: 118 mg/dL — ABNORMAL HIGH (ref 0–99)
NonHDL: 141.09
Total CHOL/HDL Ratio: 4
Triglycerides: 116 mg/dL (ref 0.0–149.0)
VLDL: 23.2 mg/dL (ref 0.0–40.0)

## 2019-01-08 LAB — HEMOGLOBIN A1C: Hgb A1c MFr Bld: 8.2 % — ABNORMAL HIGH (ref 4.6–6.5)

## 2019-01-08 LAB — MICROALBUMIN / CREATININE URINE RATIO
Creatinine,U: 40.4 mg/dL
Microalb Creat Ratio: 1.7 mg/g (ref 0.0–30.0)
Microalb, Ur: <0.7 mg/dL (ref 0.0–1.9)

## 2019-01-08 LAB — VITAMIN D 25 HYDROXY (VIT D DEFICIENCY, FRACTURES): VITD: 8.52 ng/mL — ABNORMAL LOW (ref 30.00–100.00)

## 2019-01-10 LAB — FRUCTOSAMINE: Fructosamine: 229 umol/L (ref 205–285)

## 2019-01-13 ENCOUNTER — Telehealth: Payer: Self-pay

## 2019-01-13 NOTE — Telephone Encounter (Signed)
Called patient and she stated that she likes Dr. Martinique but she feels more comfortable with Dr. Elease Hashimoto and requests TOC to him.  Patient has not received any message about her labs and is asking about her results.  Please advise.  Copied from [place] [phone]. Topic: General - Call Back - No Documentation >> Jan 13, 2019  2:07 PM [name] wrote: Reason for CRM: Requesting call back to discuss recent lab results. Also inquiring about PCP transfer to Dr. Elease Hashimoto, pt states they discussed this during most recent appt.  Best Contact: [phone] >> Jan 13, 2019  3:51 PM [name], [name], CMA wrote: Pt would have to ask for Transfer of Care in order to change providers.

## 2019-01-16 ENCOUNTER — Encounter: Payer: Self-pay | Admitting: *Deleted

## 2019-01-19 NOTE — Telephone Encounter (Signed)
Can someone put in a TOC for this patient? Please see messages.

## 2019-01-19 NOTE — Telephone Encounter (Signed)
I will see her 

## 2019-01-19 NOTE — Telephone Encounter (Signed)
Patient is scheduled for Wednesday at 7:30 AM

## 2019-01-21 ENCOUNTER — Other Ambulatory Visit: Payer: Self-pay

## 2019-01-21 ENCOUNTER — Ambulatory Visit (INDEPENDENT_AMBULATORY_CARE_PROVIDER_SITE_OTHER): Payer: 59 | Admitting: Family Medicine

## 2019-01-21 DIAGNOSIS — E1169 Type 2 diabetes mellitus with other specified complication: Secondary | ICD-10-CM | POA: Diagnosis not present

## 2019-01-21 DIAGNOSIS — Z800 Family history of malignant neoplasm of digestive organs: Secondary | ICD-10-CM

## 2019-01-21 DIAGNOSIS — E559 Vitamin D deficiency, unspecified: Secondary | ICD-10-CM

## 2019-01-21 DIAGNOSIS — Z8249 Family history of ischemic heart disease and other diseases of the circulatory system: Secondary | ICD-10-CM | POA: Diagnosis not present

## 2019-01-21 DIAGNOSIS — E785 Hyperlipidemia, unspecified: Secondary | ICD-10-CM

## 2019-01-21 DIAGNOSIS — E1165 Type 2 diabetes mellitus with hyperglycemia: Secondary | ICD-10-CM

## 2019-01-21 MED ORDER — INSULIN PEN NEEDLE 31G X 5 MM MISC: 3 refills | Status: DC

## 2019-01-21 MED ORDER — PRAVASTATIN SODIUM 20 MG PO TABS: 20.0000 mg | ORAL_TABLET | Freq: Every day | ORAL | 3 refills | Status: DC

## 2019-01-21 MED ORDER — VITAMIN D3 1.25 MG (50000 UT) PO TABS: 1.0000 | ORAL_TABLET | ORAL | 2 refills | Status: DC

## 2019-01-21 NOTE — Progress Notes (Signed)
Patient ID: Tanya Houston, female   DOB: 1966-01-10, 53 y.o.   MRN: [id number]  This visit type was conducted due to national recommendations for restrictions regarding the COVID-19 pandemic in an effort to limit this patient's exposure and mitigate transmission in our community.   Virtual Visit via Video Note  I connected with [name] on 01/21/19 at  7:30 AM EDT by a video enabled telemedicine application and verified that I am speaking with the correct person using two identifiers.  Location patient: home Location provider:work or home office Persons participating in the virtual visit: patient, provider  I discussed the limitations of evaluation and management by telemedicine and the availability of in person appointments. The patient expressed understanding and agreed to proceed.   HPI:  Patient had requested change of primary care.  She had multiple recent labs and had requested going over these and review.  She had comprehensive metabolic panel which was basically unremarkable.  Vitamin D level 8.52, A1c 8.2, cholesterol 194 with LDL cholesterol 118, normal urine microalbumin, ferritin 44  She has type 2 diabetes.  Worsening control but she has had 2 recent courses of prednisone, though brief.  She would like to have 24-month trial of lifestyle management and dietary change and trying to ramp up her exercise before looking additional medication.  Previous intolerance with metformin.  She takes Victoza daily.  She needs injection needles for that.  She would like to see about further teaching with diabetes educator.  Strong family history of premature CAD.  Mother had MI at 22 and a sister with MI at 8.  Maternal grandmother with stroke at age 76.  Patient previously tried on atorvastatin but had myalgias.  She has not tried any other statins.  Very low vitamin D.  She has taken concentrated supplement in the past.  She has family history of colon cancer.  Father had colon cancer  and she gets colonoscopies every 5 years.  She states last colonoscopy was about 3 years ago.   ROS: See pertinent positives and negatives per HPI.  Past Medical History:  Diagnosis Date  . Anemia   . Anxiety    panic attacks  . Arthritis   . Asthma   . Complication of anesthesia    difficulty breathing for a few a minutes after a procedure  . Difficult intubation    pt unsure   . Environmental and seasonal allergies   . Family history of adverse reaction to anesthesia    " my son hard a hard time waking up "  . GERD (gastroesophageal reflux disease)   . Headache   . Helicobacter pylori gastritis   . Hernia, abdominal   . History of kidney stones   . Irregular heartbeat   . Pneumonia   . Recurrent sinus infections   . Type II diabetes mellitus ([place])     Past Surgical History:  Procedure Laterality Date  . APPENDECTOMY    . BIOPSY ENDOMETRIAL    . COLONOSCOPY W/ BIOPSIES AND POLYPECTOMY    . ESOPHAGOGASTRODUODENOSCOPY (EGD) WITH PROPOFOL N/A 11/26/2016   Procedure: ESOPHAGOGASTRODUODENOSCOPY (EGD) WITH PROPOFOL;  Surgeon: Otis Brace, MD;  Location: [facility];  Service: Gastroenterology;  Laterality: N/A;  . ROTATOR CUFF REPAIR      Family History  Problem Relation Age of Onset  . Diabetes Mother   . Heart attack Mother   . Colon cancer Father   . Cervical cancer Sister   . Diabetes Sister   . Heart attack  Sister   . Epilepsy Brother     SOCIAL HX: Non-smoker   Current Outpatient Medications:  .  albuterol (PROVENTIL HFA;VENTOLIN HFA) 108 (90 Base) MCG/ACT inhaler, Inhale 2 puffs into the lungs every 6 (six) hours as needed for wheezing or shortness of breath., Disp: 1 Inhaler, Rfl: 0 .  cetirizine (ZYRTEC) 10 MG tablet, Take 1 tablet (10 mg total) by mouth daily., Disp: 90 tablet, Rfl: 3 .  Cholecalciferol (VITAMIN D3) 1.25 MG (50000 UT) TABS, Take 1 tablet by mouth once a week., Disp: 12 tablet, Rfl: 2 .  doxepin (SINEQUAN) 10 MG capsule, Take 1-2 tabs  by mouth nightly for itching, Disp: , Rfl:  .  fluticasone (FLONASE) 50 MCG/ACT nasal spray, Place 2 sprays into both nostrils daily., Disp: 16 g, Rfl: 6 .  hydrocortisone (ANUSOL-HC) 25 MG suppository, Place 25 mg rectally 2 (two) times daily as needed for hemorrhoids. , Disp: , Rfl:  .  hydrOXYzine (ATARAX/VISTARIL) 25 MG tablet, Take 1 tablet (25 mg total) by mouth 3 (three) times daily as needed for itching., Disp: 30 tablet, Rfl: 1 .  ibuprofen (ADVIL,MOTRIN) 800 MG tablet, Take 1 tablet (800 mg total) by mouth every 8 (eight) hours as needed., Disp: 30 tablet, Rfl: 1 .  liraglutide (VICTOZA) 18 MG/3ML SOPN, Inject 0.3 mLs (1.8 mg total) into the skin daily., Disp: 3 pen, Rfl: 3 .  nystatin (MYCOSTATIN/NYSTOP) powder, Apply topically 3 (three) times daily as needed., Disp: 45 g, Rfl: 1 .  nystatin cream (MYCOSTATIN), Apply 1 application topically 2 (two) times daily., Disp: 30 g, Rfl: 1 .  pravastatin (PRAVACHOL) 20 MG tablet, Take 1 tablet (20 mg total) by mouth daily., Disp: 90 tablet, Rfl: 3 .  predniSONE (DELTASONE) 20 MG tablet, Take two tablets daily for 6 days., Disp: 12 tablet, Rfl: 0 .  PROCTOZONE-HC 2.5 % rectal cream, USE TWICE DAILY FOR 1 WEEK, THEN ONCE A DAY FOR 1 WEEK , THEN EVERY OTHER DAY FOR ONE WEEK, Disp: 28 g, Rfl: 0 .  triamcinolone ointment (KENALOG) 0.5 %, APPLY A PEA SIZED AMOUNT OF OINTMENT TOPICALLY TO AFFECTED AREA TWICE DAILY FOR 1 TO 2 WEEKS. **NOT FOR DAILY LONG TERM USE** At most use 1-2 x a week for maintenance, Disp: 30 g, Rfl: 1  EXAM:  VITALS per patient if applicable:  GENERAL: alert, oriented, appears well and in no acute distress  HEENT: atraumatic, conjunttiva clear, no obvious abnormalities on inspection of external nose and ears  NECK: normal movements of the head and neck  LUNGS: on inspection no signs of respiratory distress, breathing rate appears normal, no obvious gross SOB, gasping or wheezing  CV: no obvious cyanosis  MS: moves all  visible extremities without noticeable abnormality  PSYCH/NEURO: pleasant and cooperative, no obvious depression or anxiety, speech and thought processing grossly intact  ASSESSMENT AND PLAN:  Discussed the following assessment and plan:  #1 type 2 diabetes.  Suboptimally controlled with recent A1c 8.2%.  She has been on 2 courses of prednisone which have had likely some effect  -Discussed options and she would like to try 3 months of weight loss and increased exercise and dietary control.  If not further improved at that point consider DPP 4 inhibitor or possibly SGLT2 medication -Set up to see diabetes educator  #2 dyslipidemia.  Only mild elevated lipids but extremely strong family history of premature CAD as above.  Previous intolerance with atorvastatin  -Recommended trial with water-soluble statin such as pravastatin 20 mg daily  and recheck lipids and hepatic in 3 months  #3 vitamin D deficiency -Start back vitamin D 50,000 international units once weekly -Repeat 25-hydroxy vitamin D level in 3 months    I discussed the assessment and treatment plan with the patient. The patient was provided an opportunity to ask questions and all were answered. The patient agreed with the plan and demonstrated an understanding of the instructions.   The patient was advised to call back or seek an in-person evaluation if the symptoms worsen or if the condition fails to improve as anticipated.   Carolann Littler, MD

## 2019-02-26 ENCOUNTER — Telehealth: Payer: 59 | Admitting: Registered"

## 2019-03-02 ENCOUNTER — Other Ambulatory Visit: Payer: Self-pay | Admitting: Obstetrics and Gynecology

## 2019-03-02 DIAGNOSIS — B372 Candidiasis of skin and nail: Secondary | ICD-10-CM

## 2019-03-03 NOTE — Telephone Encounter (Signed)
Medication refill request: Triamcinolone  Last AEX:  09/30/18 JJ Next AEX: 10/21/19 Last MMG (if hormonal medication request): 07/24/18 BIRADS 1 negative/density b Refill authorized: Please advise

## 2019-03-16 ENCOUNTER — Ambulatory Visit: Payer: 59 | Admitting: Family Medicine

## 2019-04-24 ENCOUNTER — Ambulatory Visit: Payer: 59 | Admitting: Family Medicine

## 2019-04-24 ENCOUNTER — Other Ambulatory Visit: Payer: Self-pay

## 2019-04-24 ENCOUNTER — Encounter: Payer: Self-pay | Admitting: Family Medicine

## 2019-04-24 VITALS — BP 122/82 | HR 81 | Temp 97.8°F | Ht 65.0 in | Wt 303.2 lb

## 2019-04-24 DIAGNOSIS — Z23 Encounter for immunization: Secondary | ICD-10-CM | POA: Diagnosis not present

## 2019-04-24 DIAGNOSIS — E1165 Type 2 diabetes mellitus with hyperglycemia: Secondary | ICD-10-CM

## 2019-04-24 DIAGNOSIS — E1169 Type 2 diabetes mellitus with other specified complication: Secondary | ICD-10-CM | POA: Diagnosis not present

## 2019-04-24 DIAGNOSIS — E785 Hyperlipidemia, unspecified: Secondary | ICD-10-CM

## 2019-04-24 LAB — HEPATIC FUNCTION PANEL
ALT: 12 U/L (ref 0–35)
AST: 12 U/L (ref 0–37)
Albumin: 3.7 g/dL (ref 3.5–5.2)
Alkaline Phosphatase: 91 U/L (ref 39–117)
Bilirubin, Direct: 0.1 mg/dL (ref 0.0–0.3)
Total Bilirubin: 0.6 mg/dL (ref 0.2–1.2)
Total Protein: 6.9 g/dL (ref 6.0–8.3)

## 2019-04-24 LAB — LIPID PANEL
Cholesterol: 201 mg/dL — ABNORMAL HIGH (ref 0–200)
HDL: 50.4 mg/dL (ref >39.00)
LDL Cholesterol: 135 mg/dL — ABNORMAL HIGH (ref 0–99)
NonHDL: 150.39
Total CHOL/HDL Ratio: 4
Triglycerides: 78 mg/dL (ref 0.0–149.0)
VLDL: 15.6 mg/dL (ref 0.0–40.0)

## 2019-04-24 LAB — BASIC METABOLIC PANEL
BUN: 12 mg/dL (ref 6–23)
CO2: 28 mEq/L (ref 19–32)
Calcium: 9.3 mg/dL (ref 8.4–10.5)
Chloride: 102 mEq/L (ref 96–112)
Creatinine, Ser: 0.78 mg/dL (ref 0.40–1.20)
GFR: 93.79 mL/min (ref >60.00)
Glucose, Bld: 120 mg/dL — ABNORMAL HIGH (ref 70–99)
Potassium: 4.2 mEq/L (ref 3.5–5.1)
Sodium: 139 mEq/L (ref 135–145)

## 2019-04-24 LAB — HEMOGLOBIN A1C: Hgb A1c MFr Bld: 7.1 % — ABNORMAL HIGH (ref 4.6–6.5)

## 2019-04-24 NOTE — Progress Notes (Signed)
Subjective:     Patient ID: Tanya Houston, female   DOB: January 21, 1966, 53 y.o.   MRN: [id number]  HPI Here for medical follow-up.  She has medical problems including type 2 diabetes, history of vitamin D deficiency, hyperlipidemia, allergic rhinitis, chronic dermatitis issues.  She has seen multiple dermatologists in the past.  She took metformin but had itching.  Is currently treated with Victoza.  Her blood sugars are improving.  She has lost some weight since starting Victoza.  We started pravastatin for hyperlipidemia but she states she had some chest pain.  No generalized myalgias.  She has been off the pravastatin now for couple months.  She had a brother that just had coronary disease at age 28.  Multiple family members with coronary artery disease but they were all smokers.  Past Medical History:  Diagnosis Date  . Anemia   . Anxiety    panic attacks  . Arthritis   . Asthma   . Complication of anesthesia    difficulty breathing for a few a minutes after a procedure  . Difficult intubation    pt unsure   . Environmental and seasonal allergies   . Family history of adverse reaction to anesthesia    " my son hard a hard time waking up "  . GERD (gastroesophageal reflux disease)   . Headache   . Helicobacter pylori gastritis   . Hernia, abdominal   . History of kidney stones   . Irregular heartbeat   . Pneumonia   . Recurrent sinus infections   . Type II diabetes mellitus ([place])    Past Surgical History:  Procedure Laterality Date  . APPENDECTOMY    . BIOPSY ENDOMETRIAL    . COLONOSCOPY W/ BIOPSIES AND POLYPECTOMY    . ESOPHAGOGASTRODUODENOSCOPY (EGD) WITH PROPOFOL N/A 11/26/2016   Procedure: ESOPHAGOGASTRODUODENOSCOPY (EGD) WITH PROPOFOL;  Surgeon: Otis Brace, MD;  Location: [facility];  Service: Gastroenterology;  Laterality: N/A;  . ROTATOR CUFF REPAIR      reports that she has never smoked. She has never used smokeless tobacco. She reports current alcohol use.  She reports that she does not use drugs. family history includes Cervical cancer in her sister; Colon cancer in her father; Diabetes in her mother and sister; Epilepsy in her brother; Heart attack in her mother and sister. Allergies  Allergen Reactions  . Adhesive [Tape] Hives    And Band Aids  . Fish Allergy Shortness Of Breath and Itching  . Latex Hives  . Peanut Oil   . Sulfa Antibiotics Hives     Review of Systems  Constitutional: Negative for fatigue.  Eyes: Negative for visual disturbance.  Respiratory: Negative for cough, chest tightness, shortness of breath and wheezing.   Cardiovascular: Negative for chest pain, palpitations and leg swelling.  Neurological: Negative for dizziness, seizures, syncope, weakness, light-headedness and headaches.       Objective:   Physical Exam Constitutional:      Appearance: She is well-developed.  Eyes:     Pupils: Pupils are equal, round, and reactive to light.  Neck:     Musculoskeletal: Neck supple.     Thyroid: No thyromegaly.     Vascular: No JVD.  Cardiovascular:     Rate and Rhythm: Normal rate and regular rhythm.     Heart sounds: No gallop.   Pulmonary:     Effort: Pulmonary effort is normal. No respiratory distress.     Breath sounds: Normal breath sounds. No wheezing or rales.  Neurological:  Mental Status: She is alert.        Assessment:     #1 type 2 diabetes.  History of suboptimal control  #2 dyslipidemia-currently not on statin.  #3 strong family history of premature CAD  #4 history of chronic dermatitis issues    Plan:     -Recheck labs with lipid, hepatic, basic metabolic panel, [id number] -Flu vaccine given -Patient request starting weight watchers.  We see no contraindications -discussed importance of statins in type 2 diabetes- especially with her FH of premature CAD.  Eulas Post MD [facility]

## 2019-04-24 NOTE — Patient Instructions (Signed)

## 2019-06-16 ENCOUNTER — Other Ambulatory Visit: Payer: Self-pay | Admitting: Family Medicine

## 2019-06-18 NOTE — Telephone Encounter (Signed)
Patient of Dr. Elease Hashimoto now

## 2019-06-29 ENCOUNTER — Telehealth: Payer: Self-pay | Admitting: Family Medicine

## 2019-06-29 ENCOUNTER — Other Ambulatory Visit: Payer: Self-pay

## 2019-06-29 ENCOUNTER — Other Ambulatory Visit: Payer: Self-pay | Admitting: Family Medicine

## 2019-06-29 NOTE — Telephone Encounter (Signed)
Copied from [facility] #[id number]. Topic: Quick Communication - See Telephone Encounter >> Jun 29, 2019  8:50 AM Loma Boston wrote: CRM for notification. See Telephone encounter for: 06/29/19.pt wants Dr B nurse to call re a refill was not willing to even give me the name of the refill pls call [phone]

## 2019-06-29 NOTE — Telephone Encounter (Signed)
Patient is needing a refill on fluconazole (DIFLUCAN) 150 MG tablet    Sent to:  [id number] [place] [place] [address] [phone] (Phone) [phone] (Fax)

## 2019-06-29 NOTE — Telephone Encounter (Signed)
Patient is calling regarding Diflucan. Patient medication has not been sent.  Patient was told that the medication would be sent today for a yeast infection.   [facility] [place], [place].   [phone]

## 2019-06-29 NOTE — Telephone Encounter (Signed)
Dr. Martinique do you know anything about this?

## 2019-06-30 ENCOUNTER — Other Ambulatory Visit: Payer: Self-pay

## 2019-06-30 ENCOUNTER — Ambulatory Visit (INDEPENDENT_AMBULATORY_CARE_PROVIDER_SITE_OTHER): Payer: 59 | Admitting: Obstetrics and Gynecology

## 2019-06-30 ENCOUNTER — Telehealth: Payer: Self-pay | Admitting: Obstetrics and Gynecology

## 2019-06-30 ENCOUNTER — Encounter: Payer: Self-pay | Admitting: Obstetrics and Gynecology

## 2019-06-30 VITALS — BP 122/80 | HR 76 | Temp 97.2°F | Wt 303.0 lb

## 2019-06-30 DIAGNOSIS — Z113 Encounter for screening for infections with a predominantly sexual mode of transmission: Secondary | ICD-10-CM | POA: Diagnosis not present

## 2019-06-30 DIAGNOSIS — N766 Ulceration of vulva: Secondary | ICD-10-CM

## 2019-06-30 DIAGNOSIS — N760 Acute vaginitis: Secondary | ICD-10-CM | POA: Diagnosis not present

## 2019-06-30 MED ORDER — VALACYCLOVIR HCL 1 G PO TABS: 1000.0000 mg | ORAL_TABLET | Freq: Two times a day (BID) | ORAL | 0 refills | Status: DC

## 2019-06-30 MED ORDER — LIDOCAINE 5 % EX OINT: 1.0000 "application " | TOPICAL_OINTMENT | Freq: Four times a day (QID) | CUTANEOUS | 0 refills | Status: DC | PRN

## 2019-06-30 NOTE — Telephone Encounter (Signed)
I am not sure why this has been sent to me again. I already responded this message. I recommend arranging appointment, I believe she has a gynecologist. I do not feel comfortable ordering medication without appropriate evaluation as well as the risk of side effects. Thanks, BJ

## 2019-06-30 NOTE — Telephone Encounter (Signed)
Call to patient. Patient states she is having vaginal discharge and a "terrible odor." Patient states it hurts to sit right now and that all symptoms are internal. Has been using vasoline and nystatin powder. States she feels the vaginal cream is causing the irritation to be worse. Also complaining of sharp pains in her lower abdomen. States, "it just hurts." Contacted her PCP yesterday for a diflucan prescription, but it was never sent to the pharmacy. Patient requesting an appointment with Dr. Talbert Nan so she can be evaluated. States she has also noticed some lesions on her vulva and that it "just looks dark down there" which is concerning to her. OV scheduled for 06-30-2019 at 1430. Patient agreeable to date and time of appointment. Covid prescreening negative.   Routing to provider and will close encounter.

## 2019-06-30 NOTE — Progress Notes (Signed)
GYNECOLOGY  VISIT   HPI: 53 y.o.   Married Black or [place] American Not Hispanic or Latino  female   [phone] with No LMP recorded. Patient is perimenopausal.   here for vaginitis.    She has had issues with intermittent vulvitis.  She c/o a 1.5 week h/o irritation/itching at the opening of her vagina. She had some terconazole cream, that made her more irritated. Got itchy after using the cream. Now she is so sore, that it hurts to sit. She has noticed little "lesions, knots" over the last 4-5 days. She is using Vaseline or Terazol. No dysuria.   GYNECOLOGIC HISTORY: No LMP recorded. Patient is perimenopausal. Contraception: Abstinence Menopausal hormone therapy: None        OB History    Gravida  3   Para  2   Term  2   Preterm      AB  1   Living  2     SAB      TAB      Ectopic      Multiple      Live Births  2              Patient Active Problem List   Diagnosis Date Noted  . Family history of premature coronary artery disease 01/21/2019  . Family history of colon cancer 01/21/2019  . Vitamin D deficiency, unspecified 12/26/2018  . Iron deficiency anemia 12/26/2018  . Hyperlipidemia associated with type 2 diabetes mellitus ([place]) 12/26/2018  . Vulvovaginal pruritus 08/29/2018  . Eczema 04/15/2017  . Uncontrolled type 2 diabetes mellitus with hyperglycemia, without long-term current use of insulin ([place]) 12/13/2016  . Morbid obesity with BMI of 50.0-59.9, adult ([place]) 12/10/2016  . Allergic rhinitis 12/10/2016    Past Medical History:  Diagnosis Date  . Anemia   . Anxiety    panic attacks  . Arthritis   . Asthma   . Complication of anesthesia    difficulty breathing for a few a minutes after a procedure  . Difficult intubation    pt unsure   . Environmental and seasonal allergies   . Family history of adverse reaction to anesthesia    " my son hard a hard time waking up "  . GERD (gastroesophageal reflux disease)   . Headache   . Helicobacter  pylori gastritis   . Hernia, abdominal   . History of kidney stones   . Irregular heartbeat   . Pneumonia   . Recurrent sinus infections   . Type II diabetes mellitus ([place])     Past Surgical History:  Procedure Laterality Date  . APPENDECTOMY    . BIOPSY ENDOMETRIAL    . COLONOSCOPY W/ BIOPSIES AND POLYPECTOMY    . ESOPHAGOGASTRODUODENOSCOPY (EGD) WITH PROPOFOL N/A 11/26/2016   Procedure: ESOPHAGOGASTRODUODENOSCOPY (EGD) WITH PROPOFOL;  Surgeon: Otis Brace, MD;  Location: [facility];  Service: Gastroenterology;  Laterality: N/A;  . ROTATOR CUFF REPAIR      Current Outpatient Medications  Medication Sig Dispense Refill  . albuterol (PROVENTIL HFA;VENTOLIN HFA) 108 (90 Base) MCG/ACT inhaler Inhale 2 puffs into the lungs every 6 (six) hours as needed for wheezing or shortness of breath. 1 Inhaler 0  . cetirizine (ZYRTEC) 10 MG tablet Take 1 tablet (10 mg total) by mouth daily. 90 tablet 3  . Cholecalciferol (VITAMIN D3) 1.25 MG (50000 UT) TABS Take 1 tablet by mouth once a week. 12 tablet 2  . doxepin (SINEQUAN) 10 MG capsule Take 1-2 tabs by  mouth nightly for itching    . fluticasone (FLONASE) 50 MCG/ACT nasal spray Place 2 sprays into both nostrils daily. 16 g 6  . hydrocortisone (ANUSOL-HC) 25 MG suppository Place 25 mg rectally 2 (two) times daily as needed for hemorrhoids.     . hydrOXYzine (ATARAX/VISTARIL) 25 MG tablet Take 1 tablet (25 mg total) by mouth 3 (three) times daily as needed for itching. 30 tablet 1  . ibuprofen (ADVIL,MOTRIN) 800 MG tablet Take 1 tablet (800 mg total) by mouth every 8 (eight) hours as needed. 30 tablet 1  . Insulin Pen Needle (B-D UF III MINI PEN NEEDLES) 31G X 5 MM MISC Use 1 needle daily for Victoza injections. Dx Code E11.65 100 each 3  . nystatin (MYCOSTATIN/NYSTOP) powder Apply topically 3 (three) times daily as needed. 45 g 1  . nystatin cream (MYCOSTATIN) Apply 1 application topically 2 (two) times daily. 30 g 1  . PROCTOZONE-HC 2.5 %  rectal cream USE TWICE DAILY FOR 1 WEEK, THEN ONCE A DAY FOR 1 WEEK , THEN EVERY OTHER DAY FOR ONE WEEK 28 g 0  . triamcinolone ointment (KENALOG) 0.5 % APPLY PEA SIZED AMOUNT TO AFFECTED AREA 2 XA DAY FOR 1 TO 2 WEEKS NOT FOR DAILY LONG TERM USE 30 g 1  . VICTOZA 18 MG/3ML SOPN INJECT 1.8 MG UNDER THE SKIN ONCE DAILY 3 pen 3   No current facility-administered medications for this visit.      ALLERGIES: Adhesive [tape], Fish allergy, Latex, Peanut oil, and Sulfa antibiotics  Family History  Problem Relation Age of Onset  . Diabetes Mother   . Heart attack Mother   . Colon cancer Father   . Cervical cancer Sister   . Diabetes Sister   . Heart attack Sister   . Epilepsy Brother     Social History   Socioeconomic History  . Marital status: Married    Spouse name: Not on file  . Number of children: Not on file  . Years of education: Not on file  . Highest education level: Not on file  Occupational History  . Not on file  Social Needs  . Financial resource strain: Not on file  . Food insecurity    Worry: Not on file    Inability: Not on file  . Transportation needs    Medical: Not on file    Non-medical: Not on file  Tobacco Use  . Smoking status: Never Smoker  . Smokeless tobacco: Never Used  Substance and Sexual Activity  . Alcohol use: Yes    Comment: occ  . Drug use: No  . Sexual activity: Not Currently    Partners: Male    Birth control/protection: None  Lifestyle  . Physical activity    Days per week: Not on file    Minutes per session: Not on file  . Stress: Not on file  Relationships  . Social Herbalist on phone: Not on file    Gets together: Not on file    Attends religious service: Not on file    Active member of club or organization: Not on file    Attends meetings of clubs or organizations: Not on file    Relationship status: Not on file  . Intimate partner violence    Fear of current or ex partner: Not on file    Emotionally abused:  Not on file    Physically abused: Not on file    Forced sexual activity: Not on file  Other Topics Concern  . Not on file  Social History Narrative  . Not on file    Review of Systems  Constitutional: Negative.   HENT: Negative.   Eyes: Negative.   Respiratory: Negative.   Cardiovascular: Negative.   Gastrointestinal: Negative.   Genitourinary:       Vaginal irritation  Musculoskeletal: Negative.   Skin: Negative.   Neurological: Negative.   Endo/Heme/Allergies: Negative.   Psychiatric/Behavioral: Negative.     PHYSICAL EXAMINATION:    BP 122/80 (BP Location: Right Arm, Patient Position: Sitting, Cuff Size: Large)   Pulse 76   Temp (!) 97.2 F (36.2 C) (Skin)   Wt (!) 303 lb (137.4 kg)   BMI 50.42 kg/m     General appearance: alert, cooperative and appears stated age  Pelvic: External genitalia:  Ulcers on bilateral inner labia majora, swollen              Urethra:  normal appearing urethra with no masses, tenderness or lesions              Bartholins and Skenes: normal                 Vagina: normal appearing vagina with normal color and discharge, no lesions              Cervix: no lesions               Chaperone was present for exam.  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  ~20 minutes face to face time of which over 50% was spent in counseling.

## 2019-06-30 NOTE — Telephone Encounter (Signed)
Left message to call Colletta Maryland, RN at [facility].

## 2019-06-30 NOTE — Telephone Encounter (Signed)
Called patient. No answer. LVM for patient to return call. Please let patient know to either go to OB/GYN or she will need an in person visit with Dr. Martinique.

## 2019-06-30 NOTE — Patient Instructions (Signed)
Genital Herpes °Genital herpes is a common sexually transmitted infection (STI) that is caused by a virus. The virus spreads from person to person through sexual contact. Infection can cause itching, blisters, and sores around the genitals or rectum. Symptoms may last several days and then go away This is called an outbreak. However, the virus remains in your body, so you may have more outbreaks in the future. The time between outbreaks varies and can be months or years. °Genital herpes affects men and women. It is particularly concerning for pregnant women because the virus can be passed to the baby during delivery and can cause serious problems. Genital herpes is also a concern for people who have a weak disease-fighting (immune) system. °What are the causes? °This condition is caused by the herpes simplex virus (HSV) type 1 or type 2. The virus may spread through: °· Sexual contact with an infected person, including vaginal, anal, and oral sex. °· Contact with fluid from a herpes sore. °· The skin. This means that you can get herpes from an infected partner even if he or she does not have a visible sore or does not know that he or she is infected. °What increases the risk? °You are more likely to develop this condition if: °· You have sex with many partners. °· You do not use latex condoms during sex. °What are the signs or symptoms? °Most people do not have symptoms (asymptomatic) or have mild symptoms that may be mistaken for other skin problems. Symptoms may include: °· Small red bumps near the genitals, rectum, or mouth. These bumps turn into blisters and then turn into sores. °· Flu-like symptoms, including: °? Fever. °? Body aches. °? Swollen lymph nodes. °? Headache. °· Painful urination. °· Pain and itching in the genital area or rectal area. °· Vaginal discharge. °· Tingling or shooting pain in the legs and buttocks. °Generally, symptoms are more severe and last longer during the first (primary)  outbreak. Flu-like symptoms are also more common during the primary outbreak. °How is this diagnosed? °Genital herpes may be diagnosed based on: °· A physical exam. °· Your medical history. °· Blood tests. °· A test of a fluid sample (culture) from an open sore. °How is this treated? °There is no cure for this condition, but treatment with antiviral medicines that are taken by mouth (orally) can do the following: °· Speed up healing and relieve symptoms. °· Help to reduce the spread of the virus to sexual partners. °· Limit the chance of future outbreaks, or make future outbreaks shorter. °· Lessen symptoms of future outbreaks. °Your health care provider may also recommend pain relief medicines, such as aspirin or ibuprofen. °Follow these instructions at home: °Sexual activity °· Do not have sexual contact during active outbreaks. °· Practice safe sex. Latex condoms and female condoms may help prevent the spread of the herpes virus. °General instructions °· Keep the affected areas dry and clean. °· Take over-the-counter and prescription medicines only as told by your health care provider. °· Avoid rubbing or touching blisters and sores. If you do touch blisters or sores: °? Wash your hands thoroughly with soap and water. °? Do not touch your eyes afterward. °· To help relieve pain or itching, you may take the following actions as directed by your health care provider: °? Apply a cold, wet cloth (cold compress) to affected areas 4-6 times a day. °? Apply a substance that protects your skin and reduces bleeding (astringent). °? Apply a gel that   helps relieve pain around sores (lidocaine gel). °? Take a warm, shallow bath that cleans the genital area (sitz bath). °· Keep all follow-up visits as told by your health care provider. This is important. °How is this prevented? °· Use condoms. Although anyone can get genital herpes during sexual contact, even with the use of a condom, a condom can provide some  protection. °· Avoid having multiple sexual partners. °· Talk with your sexual partner about any symptoms either of you may have. Also, talk with your partner about any history of STIs. °· Get tested for STIs before you have sex. Ask your partner to do the same. °· Do not have sexual contact if you have symptoms of genital herpes. °Contact a health care provider if: °· Your symptoms are not improving with medicine. °· Your symptoms return. °· You have new symptoms. °· You have a fever. °· You have abdominal pain. °· You have redness, swelling, or pain in your eye. °· You notice new sores on other parts of your body. °· You are a woman and experience bleeding between menstrual periods. °· You have had herpes and you become pregnant or plan to become pregnant. °Summary °· Genital herpes is a common sexually transmitted infection (STI) that is caused by the herpes simplex virus (HSV) type 1 or type 2. °· These viruses are most often spread through sexual contact with an infected person. °· You are more likely to develop this condition if you have sex with many partners or you have unprotected sex. °· Most people do not have symptoms (asymptomatic) or have mild symptoms that may be mistaken for other skin problems. Symptoms occur as outbreaks that may happen months or years apart. °· There is no cure for this condition, but treatment with oral antiviral medicines can reduce symptoms, reduce the chance of spreading the virus to a partner, prevent future outbreaks, or shorten future outbreaks. °This information is not intended to replace advice given to you by your health care provider. Make sure you discuss any questions you have with your health care provider. °Document Released: 08/03/2000 Document Revised: 02/10/2018 Document Reviewed: 07/06/2016 °Elsevier Patient Education © 2020 [employer] ° °

## 2019-06-30 NOTE — Telephone Encounter (Signed)
Patient is having yeast symptoms. Message to triage to assist with scheduling.

## 2019-07-02 LAB — VAGINITIS/VAGINOSIS, DNA PROBE
Candida Species: NEGATIVE
Gardnerella vaginalis: NEGATIVE
Trichomonas vaginosis: NEGATIVE

## 2019-07-02 LAB — HEPATITIS C ANTIBODY: Hep C Virus Ab: <0.1 s/co ratio (ref 0.0–0.9)

## 2019-07-02 LAB — HEP, RPR, HIV PANEL
HIV Screen 4th Generation wRfx: NONREACTIVE
Hepatitis B Surface Ag: NEGATIVE
RPR Ser Ql: NONREACTIVE

## 2019-07-02 LAB — HSV(HERPES SMPLX)ABS-I+II(IGG+IGM)-BLD
HSV 1 Glycoprotein G Ab, IgG: <0.91 index (ref 0.00–0.90)
HSV 2 IgG, Type Spec: <0.91 index (ref 0.00–0.90)
HSVI/II Comb IgM: <0.91 Ratio (ref 0.00–0.90)

## 2019-07-02 LAB — HSV NAA
HSV 1 NAA: NEGATIVE
HSV 2 NAA: NEGATIVE

## 2019-07-03 LAB — CHLAMYDIA/GONOCOCCUS/TRICHOMONAS, NAA
Chlamydia by NAA: NEGATIVE
Gonococcus by NAA: NEGATIVE
Trich vag by NAA: NEGATIVE

## 2019-10-21 ENCOUNTER — Ambulatory Visit: Payer: 59 | Admitting: Obstetrics and Gynecology

## 2019-11-12 ENCOUNTER — Other Ambulatory Visit: Payer: Self-pay | Admitting: Family Medicine

## 2019-11-12 DIAGNOSIS — J309 Allergic rhinitis, unspecified: Secondary | ICD-10-CM

## 2019-11-24 ENCOUNTER — Telehealth: Payer: Self-pay | Admitting: *Deleted

## 2019-11-24 NOTE — Telephone Encounter (Signed)
Message left to return call to [facility] at [phone].   Patient in 08 recall for 03/21. Patient needs to schedule aex.

## 2019-12-08 ENCOUNTER — Other Ambulatory Visit: Payer: Self-pay

## 2019-12-09 ENCOUNTER — Encounter: Payer: Self-pay | Admitting: Family Medicine

## 2019-12-09 ENCOUNTER — Ambulatory Visit (INDEPENDENT_AMBULATORY_CARE_PROVIDER_SITE_OTHER): Payer: 59 | Admitting: Family Medicine

## 2019-12-09 VITALS — BP 122/78 | HR 66 | Temp 97.8°F | Wt 307.3 lb

## 2019-12-09 DIAGNOSIS — E119 Type 2 diabetes mellitus without complications: Secondary | ICD-10-CM | POA: Diagnosis not present

## 2019-12-09 DIAGNOSIS — E785 Hyperlipidemia, unspecified: Secondary | ICD-10-CM

## 2019-12-09 DIAGNOSIS — L309 Dermatitis, unspecified: Secondary | ICD-10-CM | POA: Diagnosis not present

## 2019-12-09 DIAGNOSIS — E1169 Type 2 diabetes mellitus with other specified complication: Secondary | ICD-10-CM | POA: Diagnosis not present

## 2019-12-09 DIAGNOSIS — Z6841 Body Mass Index (BMI) 40.0 and over, adult: Secondary | ICD-10-CM

## 2019-12-09 LAB — POCT GLYCOSYLATED HEMOGLOBIN (HGB A1C): Hemoglobin A1C: 6.4 % — AB (ref 4.0–5.6)

## 2019-12-09 MED ORDER — LIVALO 2 MG PO TABS: ORAL_TABLET | ORAL | 11 refills | Status: DC

## 2019-12-09 MED ORDER — CICLOPIROX OLAMINE 0.77 % EX CREA: TOPICAL_CREAM | Freq: Two times a day (BID) | CUTANEOUS | 1 refills | Status: DC

## 2019-12-09 MED ORDER — HYDROXYZINE HCL 25 MG PO TABS: 25.0000 mg | ORAL_TABLET | Freq: Three times a day (TID) | ORAL | 3 refills | Status: DC | PRN

## 2019-12-09 NOTE — Progress Notes (Signed)
Subjective:     Patient ID: Tanya Houston, female   DOB: 1965-12-07, 54 y.o.   MRN: [id number]  HPI Ms. Ratterree has chronic problems including history of type 2 diabetes, dyslipidemia, chronic vulvovaginal pruritus.  She also has some pruritus involving her lower abdominal region near her skin folds.  She tried multiple things for this in the past including nystatin cream and powder without much relief.  She has tried topicals steroids without relief.  She has gotten some temporary relief with hydroxyzine.  She has seen multiple gynecologist for this and has not found any relief.  Diabetes has been stable.  Not monitoring regularly.  Last A1c was 7.1%.  She is on Victoza.  Previous intolerance with Metformin.  No polyuria or polydipsia.  History of hyperlipidemia.  Last LDL 135.  She was started recently on pravastatin but had some atypical chest symptoms.  She states she has also not tolerated simvastatin and she thinks Lipitor in the past.  She has been at least 3 statins without tolerance.  Past Medical History:  Diagnosis Date  . Anemia   . Anxiety    panic attacks  . Arthritis   . Asthma   . Complication of anesthesia    difficulty breathing for a few a minutes after a procedure  . Difficult intubation    pt unsure   . Environmental and seasonal allergies   . Family history of adverse reaction to anesthesia    " my son hard a hard time waking up "  . GERD (gastroesophageal reflux disease)   . Headache   . Helicobacter pylori gastritis   . Hernia, abdominal   . History of kidney stones   . Irregular heartbeat   . Pneumonia   . Recurrent sinus infections   . Type II diabetes mellitus ([place])    Past Surgical History:  Procedure Laterality Date  . APPENDECTOMY    . BIOPSY ENDOMETRIAL    . COLONOSCOPY W/ BIOPSIES AND POLYPECTOMY    . ESOPHAGOGASTRODUODENOSCOPY (EGD) WITH PROPOFOL N/A 11/26/2016   Procedure: ESOPHAGOGASTRODUODENOSCOPY (EGD) WITH PROPOFOL;  Surgeon: Otis Brace, MD;  Location: [facility];  Service: Gastroenterology;  Laterality: N/A;  . ROTATOR CUFF REPAIR      reports that she has never smoked. She has never used smokeless tobacco. She reports current alcohol use. She reports that she does not use drugs. family history includes Cervical cancer in her sister; Colon cancer in her father; Diabetes in her mother and sister; Epilepsy in her brother; Heart attack in her mother and sister. Allergies  Allergen Reactions  . Adhesive [Tape] Hives    And Band Aids  . Fish Allergy Shortness Of Breath and Itching  . Latex Hives  . Peanut Oil   . Sulfa Antibiotics Hives     Review of Systems  Constitutional: Negative for chills, fatigue, fever and unexpected weight change.  Eyes: Negative for visual disturbance.  Respiratory: Negative for cough, chest tightness, shortness of breath and wheezing.   Cardiovascular: Negative for chest pain, palpitations and leg swelling.  Endocrine: Negative for polydipsia and polyuria.  Skin: Positive for rash.  Neurological: Negative for dizziness, seizures, syncope, weakness, light-headedness and headaches.       Objective:   Physical Exam Constitutional:      Appearance: She is well-developed.  Eyes:     Pupils: Pupils are equal, round, and reactive to light.  Neck:     Thyroid: No thyromegaly.     Vascular: No JVD.  Cardiovascular:  Rate and Rhythm: Normal rate and regular rhythm.     Heart sounds: No gallop.   Pulmonary:     Effort: Pulmonary effort is normal. No respiratory distress.     Breath sounds: Normal breath sounds. No wheezing or rales.  Musculoskeletal:     Cervical back: Neck supple.     Right lower leg: No edema.     Left lower leg: No edema.  Neurological:     Mental Status: She is alert.        Assessment:     #1 type 2 diabetes improved with A1c 6.4%  #2 dyslipidemia.  Currently not on statin.  She has had previous questional intolerance with the least 3  different statins.  #3 chronic systemic pruritus and chronic vulvovaginal pruritus    Plan:     -Continue Victoza daily.  We challenged her to try to lose some additional weight.  We will plan 1-month follow-up to recheck A1c  -We discussed importance of trying to be on a statin for cardiovascular protection and LDL reduction.  She is somewhat reluctant but will consider trial of Livalo 2 mg 1/2 tablet every Monday Wednesday Friday and then if tolerated after a few weeks increase to 1 tablet Monday Wednesday Friday until follow-up.  We will plan to recheck lipids at follow-up visit along with hepatic panel  -Refilled her hydroxyzine to use as needed for general pruritus symptoms  -We discussed trial of Loprox cream to use once or twice daily to her skin fold rash which has not responded previously to nystatin.  Also try to keep area as dry as possible  Eulas Post MD [facility]

## 2019-12-09 NOTE — Patient Instructions (Signed)
Try the Livalo one half tablet M,, W, F and then if tolerated well try to increase to one daily.

## 2019-12-31 ENCOUNTER — Other Ambulatory Visit: Payer: Self-pay | Admitting: Family Medicine

## 2020-01-01 NOTE — Telephone Encounter (Signed)
Please advise 

## 2020-01-01 NOTE — Telephone Encounter (Signed)
Please advise if okay has been a year since vitamin D

## 2020-01-01 NOTE — Telephone Encounter (Signed)
Refill okay?  

## 2020-01-13 ENCOUNTER — Telehealth: Payer: Self-pay | Admitting: Family Medicine

## 2020-01-13 NOTE — Telephone Encounter (Signed)
Pt needs office visit to discuss and access please schedule

## 2020-01-13 NOTE — Telephone Encounter (Signed)
I have her scheduled for Friday at 7:15

## 2020-01-13 NOTE — Telephone Encounter (Signed)
Pt is having a lot of swelling and has gained 7 lbs of fluid in a week. Pt would like to switch her diabetic medication if possible. Pt is requesting a call back to discuss her options to switch her medication. Thanks

## 2020-01-14 ENCOUNTER — Other Ambulatory Visit: Payer: Self-pay

## 2020-01-15 ENCOUNTER — Ambulatory Visit (INDEPENDENT_AMBULATORY_CARE_PROVIDER_SITE_OTHER): Payer: 59 | Admitting: Family Medicine

## 2020-01-15 ENCOUNTER — Encounter: Payer: Self-pay | Admitting: Family Medicine

## 2020-01-15 VITALS — BP 140/70 | HR 82 | Temp 97.9°F | Ht 65.0 in | Wt 315.0 lb

## 2020-01-15 DIAGNOSIS — R600 Localized edema: Secondary | ICD-10-CM | POA: Diagnosis not present

## 2020-01-15 DIAGNOSIS — M545 Low back pain, unspecified: Secondary | ICD-10-CM

## 2020-01-15 LAB — BRAIN NATRIURETIC PEPTIDE: Pro B Natriuretic peptide (BNP): 15 pg/mL (ref 0.0–100.0)

## 2020-01-15 LAB — BASIC METABOLIC PANEL
BUN: 10 mg/dL (ref 6–23)
CO2: 26 mEq/L (ref 19–32)
Calcium: 9.3 mg/dL (ref 8.4–10.5)
Chloride: 104 mEq/L (ref 96–112)
Creatinine, Ser: 0.68 mg/dL (ref 0.40–1.20)
GFR: 109.57 mL/min (ref >60.00)
Glucose, Bld: 113 mg/dL — ABNORMAL HIGH (ref 70–99)
Potassium: 4 mEq/L (ref 3.5–5.1)
Sodium: 137 mEq/L (ref 135–145)

## 2020-01-15 LAB — POCT URINALYSIS DIPSTICK
Bilirubin, UA: NEGATIVE
Blood, UA: NEGATIVE
Glucose, UA: NEGATIVE
Ketones, UA: NEGATIVE
Leukocytes, UA: NEGATIVE
Nitrite, UA: NEGATIVE
Odor: NEGATIVE
Protein, UA: NEGATIVE
Spec Grav, UA: 1.02 (ref 1.010–1.025)
Urobilinogen, UA: 0.2 E.U./dL
pH, UA: 6 (ref 5.0–8.0)

## 2020-01-15 LAB — TSH: TSH: 2.93 u[IU]/mL (ref 0.35–4.50)

## 2020-01-15 MED ORDER — FUROSEMIDE 20 MG PO TABS: ORAL_TABLET | ORAL | 1 refills | Status: DC

## 2020-01-15 NOTE — Patient Instructions (Signed)
Potassium Content of Foods  Potassium is a mineral found in many foods and drinks. It affects how the heart works, and helps keep fluids and minerals balanced in the body. The amount of potassium you need each day depends on your age and any medical conditions you may have. Talk to your health care provider or dietitian about how much potassium you need. The following lists of foods provide the general serving size for foods and the approximate amount of potassium in each serving, listed in milligrams (mg). Actual values may vary depending on the product and how it is processed. High in potassium The following foods and beverages have 200 mg or more of potassium per serving:  Apricots (raw) - 2 have 200 mg of potassium.  Apricots (dry) - 5 have 200 mg of potassium.  Artichoke - 1 medium has 345 mg of potassium.  Avocado -  fruit has 245 mg of potassium.  Banana - 1 medium fruit has 425 mg of potassium.  Lima or baked beans (canned) -  cup has 280 mg of potassium.  White beans (canned) -  cup has 595 mg potassium.  Beef roast - 3 oz has 320 mg of potassium.  Ground beef - 3 oz has 270 mg of potassium.  Beets (raw or cooked) -  cup has 260 mg of potassium.  Bran muffin - 2 oz has 300 mg of potassium.  Broccoli (cooked) -  cup has 230 mg of potassium.  Brussels sprouts -  cup has 250 mg of potassium.  Cantaloupe -  cup has 215 mg of potassium.  Cereal, 100% bran -  cup has 200-400 mg of potassium.  Cheeseburger -1 single fast food burger has 225-400 mg of potassium.  Chicken - 3 oz has 220 mg of potassium.  Clams (canned) - 3 oz has 535 mg of potassium.  Crab - 3 oz has 225 mg of potassium.  Dates - 5 have 270 mg of potassium.  Dried beans and peas -  cup has 300-475 mg of potassium.  Figs (dried) - 2 have 260 mg of potassium.  Fish (halibut, tuna, cod, snapper) - 3 oz has 480 mg of potassium.  Fish (salmon, haddock, swordfish, perch) - 3 oz has 300 mg of  potassium.  Fish (tuna, canned) - 3 oz has 200 mg of potassium.  French fries (fast food) - 3 oz has 470 mg of potassium.  Granola with fruit and nuts -  cup has 200 mg of potassium.  Grapefruit juice -  cup has 200 mg of potassium.  Honeydew melon -  cup has 200 mg of potassium.  Kale (raw) - 1 cup has 300 mg of potassium.  Kiwi - 1 medium fruit has 240 mg of potassium.  Kohlrabi, rutabaga, parsnips -  cup has 280 mg of potassium.  Lentils -  cup has 365 mg of potassium.  Mango - 1 each has 325 mg of potassium.  Milk (nonfat, low-fat, whole, buttermilk) - 1 cup has 350-380 mg of potassium.  Milk (chocolate) - 1 cup has 420 mg of potassium  Molasses - 1 Tbsp has 295 mg of potassium.  Mushrooms -  cup has 280 mg of potassium.  Nectarine - 1 each has 275 mg of potassium.  Nuts (almonds, peanuts, hazelnuts, [place], cashew, mixed) - 1 oz has 200 mg of potassium.  Nuts (pistachios) - 1 oz has 295 mg of potassium.  Orange - 1 fruit has 240 mg of potassium.  Orange juice -    cup has 235 mg of potassium.  Papaya -  medium fruit has 390 mg of potassium.  Peanut butter (chunky) - 2 Tbsp has 240 mg of potassium.  Peanut butter (smooth) - 2 Tbsp has 210 mg of potassium.  Pear - 1 medium (200 mg) of potassium.  Pomegranate - 1 whole fruit has 400 mg of potassium.  Pomegranate juice -  cup has 215 mg of potassium.  Pork - 3 oz has 350 mg of potassium.  Potato chips (salted) - 1 oz has 465 mg of potassium.  Potato (baked with skin) - 1 medium has 925 mg of potassium.  Potato (boiled) -  cup has 255 mg of potassium.  Potato (Mashed) -  cup has 330 mg of potassium.  Prune juice -  cup has 370 mg of potassium.  Prunes - 5 have 305 mg of potassium.  Pudding (chocolate) -  cup has 230 mg of potassium.  Pumpkin (canned) -  cup has 250 mg of potassium.  Raisins (seedless) -  cup has 270 mg of potassium.  Seeds (sunflower or pumpkin) - 1 oz has 240 mg of  potassium.  Soy milk - 1 cup has 300 mg of potassium.  Spinach (cooked) - 1/2 cup has 420 mg of potassium.  Spinach (canned) -  cup has 370 mg of potassium.  Sweet potato (baked with skin) - 1 medium has 450 mg of potassium.  Swiss chard -  cup has 480 mg of potassium.  Tomato or vegetable juice -  cup has 275 mg of potassium.  Tomato (sauce or puree) -  cup has 400-550 mg of potassium.  Tomato (raw) - 1 medium has 290 mg of potassium.  Tomato (canned) -  cup has 200-300 mg of potassium.  Turkey - 3 oz has 250 mg of potassium.  Wheat germ - 1 oz has 250 mg of potassium.  Winter squash -  cup has 250 mg of potassium.  Yogurt (plain or fruited) - 6 oz has 260-435 mg of potassium.  Zucchini -  cup has 220 mg of potassium. Moderate in potassium The following foods and beverages have 50-200 mg of potassium per serving:  Apple - 1 fruit has 150 mg of potassium  Apple juice -  cup has 150 mg of potassium  Applesauce -  cup has 90 mg of potassium  Apricot nectar -  cup has 140 mg of potassium  Asparagus (small spears) -  cup has 155 mg of potassium  Asparagus (large spears) - 6 have 155 mg of potassium  Bagel (cinnamon raisin) - 1 four-inch bagel has 130 mg of potassium  Bagel (egg or plain) - 1 four- inch bagel has 70 mg of potassium  Beans (green) -  cup has 90 mg of potassium  Beans (yellow) -  cup has 190 mg of potassium  Beer, regular - 12 oz has 100 mg of potassium  Beets (canned) -  cup has 125 mg of potassium  Blackberries -  cup has 115 mg of potassium  Blueberries -  cup has 60 mg of potassium  Bread (whole wheat) - 1 slice has 70 mg of potassium  Broccoli (raw) -  cup has 145 mg of potassium  Cabbage -  cup has 150 mg of potassium  Carrots (cooked or raw) -  cup has 180 mg of potassium  Cauliflower (raw) -  cup has 150 mg of potassium  Celery (raw) -  cup has 155 mg of potassium  Cereal, bran   flakes -  cup has 120-150 mg  of potassium  Cheese (cottage) -  cup has 110 mg of potassium  Cherries - 10 have 150 mg of potassium  Chocolate - 1 oz bar has 165 mg of potassium  Coffee (brewed) - 6 oz has 90 mg of potassium  Corn -  cup or 1 ear has 195 mg of potassium  Cucumbers -  cup has 80 mg of potassium  Egg - 1 large egg has 60 mg of potassium  Eggplant -  cup has 60 mg of potassium  Endive (raw) -  cup has 80 mg of potassium  English muffin - 1 has 65 mg of potassium  Fish (ocean perch) - 3 oz has 192 mg of potassium  Frankfurter, beef or pork - 1 has 75 mg of potassium  Fruit cocktail -  cup has 115 mg of potassium  Grape juice -  cup has 170 mg of potassium  Grapefruit -  fruit has 175 mg of potassium  Grapes -  cup has 155 mg of potassium  Greens: kale, turnip, collard -  cup has 110-150 mg of potassium  Ice cream or frozen yogurt (chocolate) -  cup has 175 mg of potassium  Ice cream or frozen yogurt (vanilla) -  cup has 120-150 mg of potassium  Lemons, limes - 1 each has 80 mg of potassium  Lettuce - 1 cup has 100 mg of potassium  Mixed vegetables -  cup has 150 mg of potassium  Mushrooms, raw -  cup has 110 mg of potassium  Nuts (walnuts, pecans, or macadamia) - 1 oz has 125 mg of potassium  Oatmeal -  cup has 80 mg of potassium  Okra -  cup has 110 mg of potassium  Onions -  cup has 120 mg of potassium  Peach - 1 has 185 mg of potassium  Peaches (canned) -  cup has 120 mg of potassium  Pears (canned) -  cup has 120 mg of potassium  Peas, green (frozen) -  cup has 90 mg of potassium  Peppers (Green) -  cup has 130 mg of potassium  Peppers (Red) -  cup has 160 mg of potassium  Pineapple juice -  cup has 165 mg of potassium  Pineapple (fresh or canned) -  cup has 100 mg of potassium  Plums - 1 has 105 mg of potassium  Pudding, vanilla -  cup has 150 mg of potassium  Raspberries -  cup has 90 mg of potassium  Rhubarb -  cup has  115 mg of potassium  Rice, wild -  cup has 80 mg of potassium  Shrimp - 3 oz has 155 mg of potassium  Spinach (raw) - 1 cup has 170 mg of potassium  Strawberries -  cup has 125 mg of potassium  Summer squash -  cup has 175-200 mg of potassium  Swiss chard (raw) - 1 cup has 135 mg of potassium  Tangerines - 1 fruit has 140 mg of potassium  Tea, brewed - 6 oz has 65 mg of potassium  Turnips -  cup has 140 mg of potassium  Watermelon -  cup has 85 mg of potassium  Wine (Red, table) - 5 oz has 180 mg of potassium  Wine (White, table) - 5 oz 100 mg of potassium Low in potassium The following foods and beverages have less than 50 mg of potassium per serving.  Bread (white) - 1 slice has 30 mg of potassium    Carbonated beverages - 12 oz has less than 5 mg of potassium  Cheese - 1 oz has 20-30 mg of potassium  Cranberries -  cup has 45 mg of potassium  Cranberry juice cocktail -  cup has 20 mg of potassium  Fats and oils - 1 Tbsp has less than 5 mg of potassium  Hummus - 1 Tbsp has 32 mg of potassium  Nectar (papaya, mango, or pear) -  cup has 35 mg of potassium  Rice (white or brown) -  cup has 50 mg of potassium  Spaghetti or macaroni (cooked) -  cup has 30 mg of potassium  Tortilla, flour or corn - 1 has 50 mg of potassium  Waffle - 1 four-inch waffle has 50 mg of potassium  Water chestnuts -  cup has 40 mg of potassium Summary  Potassium is a mineral found in many foods and drinks. It affects how the heart works, and helps keep fluids and minerals balanced in the body.  The amount of potassium you need each day depends on your age and any existing medical conditions you may have. Your health care provider or dietitian may recommend an amount of potassium that you should have each day. This information is not intended to replace advice given to you by your health care provider. Make sure you discuss any questions you have with your health care  provider. Document Revised: 07/19/2017 Document Reviewed: 10/31/2016 Elsevier Patient Education  2020 [employer] Inc[employer]   

## 2020-01-15 NOTE — Progress Notes (Signed)
Subjective:     Patient ID: Tanya Houston, female   DOB: November 17, 1965, 54 y.o.   MRN: [id number]  HPI   [name] is seen with some increased peripheral edema and fairly rapid weight gain over the past specially week and possibly few weeks.  Her weight is up 8 pounds over the past month.  She states when she was back in her 55s she had very similar issue with fluid retention which responded to furosemide.  She was concerned because her mom died of congestive heart failure complications.  Tanya Houston had echo when she was in [place] back in 2018 that was normal.  She has no increased dyspnea.  No orthopnea.  No dietary changes.  No recent medication changes.  Her last thyroid check was about 2018 and normal.  She feels somewhat achy especially in her legs and thinks this is related to the edema.  She had some low back pain and denies any burning with urination but is requesting urinalysis.  She apparently had UTI with low back pain in the past.  Her edema is worse late in the day.  Past Medical History:  Diagnosis Date  . Anemia   . Anxiety    panic attacks  . Arthritis   . Asthma   . Complication of anesthesia    difficulty breathing for a few a minutes after a procedure  . Difficult intubation    pt unsure   . Environmental and seasonal allergies   . Family history of adverse reaction to anesthesia    " my son hard a hard time waking up "  . GERD (gastroesophageal reflux disease)   . Headache   . Helicobacter pylori gastritis   . Hernia, abdominal   . History of kidney stones   . Irregular heartbeat   . Pneumonia   . Recurrent sinus infections   . Type II diabetes mellitus ([place])    Past Surgical History:  Procedure Laterality Date  . APPENDECTOMY    . BIOPSY ENDOMETRIAL    . COLONOSCOPY W/ BIOPSIES AND POLYPECTOMY    . ESOPHAGOGASTRODUODENOSCOPY (EGD) WITH PROPOFOL N/A 11/26/2016   Procedure: ESOPHAGOGASTRODUODENOSCOPY (EGD) WITH PROPOFOL;  Surgeon: Otis Brace, MD;   Location: [facility];  Service: Gastroenterology;  Laterality: N/A;  . ROTATOR CUFF REPAIR      reports that she has never smoked. She has never used smokeless tobacco. She reports current alcohol use. She reports that she does not use drugs. family history includes Cervical cancer in her sister; Colon cancer in her father; Diabetes in her mother and sister; Epilepsy in her brother; Heart attack in her mother and sister. Allergies  Allergen Reactions  . Adhesive [Tape] Hives    And Band Aids  . Fish Allergy Shortness Of Breath and Itching  . Latex Hives  . Peanut Oil   . Sulfa Antibiotics Hives   Wt Readings from Last 3 Encounters:  01/15/20 (!) 315 lb (142.9 kg)  12/09/19 (!) 307 lb 4.8 oz (139.4 kg)  06/30/19 (!) 303 lb (137.4 kg)      Review of Systems  Constitutional: Positive for unexpected weight change. Negative for appetite change, fatigue and fever.  Eyes: Negative for visual disturbance.  Respiratory: Negative for cough, chest tightness, shortness of breath and wheezing.   Cardiovascular: Positive for leg swelling. Negative for chest pain and palpitations.  Gastrointestinal: Negative for abdominal pain.  Genitourinary: Negative for dysuria.  Musculoskeletal: Positive for back pain.  Neurological: Negative for dizziness, seizures, syncope, weakness, light-headedness  and headaches.       Objective:   Physical Exam Constitutional:      Appearance: She is well-developed.  Eyes:     Pupils: Pupils are equal, round, and reactive to light.  Neck:     Thyroid: No thyromegaly.     Vascular: No JVD.  Cardiovascular:     Rate and Rhythm: Normal rate and regular rhythm.     Heart sounds: No gallop.   Pulmonary:     Effort: Pulmonary effort is normal. No respiratory distress.     Breath sounds: Normal breath sounds. No wheezing or rales.  Musculoskeletal:     Cervical back: Neck supple.     Right lower leg: Edema present.     Left lower leg: Edema present.      Comments: Does have some edema of the feet ankles and legs especially but nonpitting  Neurological:     Mental Status: She is alert.        Assessment:     #1 recent increased bilateral leg edema and increased weight gain.  This would appear to be fluid retention.  No clear precipitating factors.  Doubt congestive heart failure  #2 nonspecific low back pain    Plan:     -Check labs with TSH, basic metabolic panel, BNP, urine dipstick  -Recommend trial of furosemide 20 mg 1-2 tablets once daily and increase potassium rich foods.  Our plan is to use this short-term for about 3 to 5 days to see if she gets back to baseline and if not to be in touch  -Follow-up promptly for any increased shortness of breath, orthopnea, or ongoing edema  Eulas Post MD [facility]

## 2020-01-21 ENCOUNTER — Encounter: Payer: Self-pay | Admitting: Family Medicine

## 2020-02-02 NOTE — Telephone Encounter (Signed)
Call to patient. Patient advised she needs to schedule aex. Patient states she is planning to establish care somewhere else. Does not plan to return for aex this year.   Removed from 08 recall.   Routing to provider and will close encounter.

## 2020-02-25 ENCOUNTER — Other Ambulatory Visit: Payer: Self-pay | Admitting: Family Medicine

## 2020-03-08 ENCOUNTER — Other Ambulatory Visit: Payer: Self-pay | Admitting: Family Medicine

## 2020-03-09 ENCOUNTER — Ambulatory Visit: Payer: 59 | Admitting: Family Medicine

## 2020-03-09 ENCOUNTER — Encounter: Payer: Self-pay | Admitting: Family Medicine

## 2020-03-09 ENCOUNTER — Other Ambulatory Visit: Payer: Self-pay

## 2020-03-09 VITALS — BP 120/64 | HR 100 | Temp 97.8°F | Wt 309.7 lb

## 2020-03-09 DIAGNOSIS — J309 Allergic rhinitis, unspecified: Secondary | ICD-10-CM

## 2020-03-09 DIAGNOSIS — J45909 Unspecified asthma, uncomplicated: Secondary | ICD-10-CM

## 2020-03-09 DIAGNOSIS — E119 Type 2 diabetes mellitus without complications: Secondary | ICD-10-CM

## 2020-03-09 DIAGNOSIS — Z8249 Family history of ischemic heart disease and other diseases of the circulatory system: Secondary | ICD-10-CM | POA: Diagnosis not present

## 2020-03-09 LAB — POCT GLYCOSYLATED HEMOGLOBIN (HGB A1C): Hemoglobin A1C: 6.1 % — AB (ref 4.0–5.6)

## 2020-03-09 MED ORDER — ALBUTEROL SULFATE HFA 108 (90 BASE) MCG/ACT IN AERS: 2.0000 | INHALATION_SPRAY | Freq: Four times a day (QID) | RESPIRATORY_TRACT | 0 refills | Status: DC | PRN

## 2020-03-09 NOTE — Patient Instructions (Signed)
Keep up the good work!  I will set up coronary calcium score  Coronary Calcium Scan A coronary calcium scan is an imaging test used to look for deposits of plaque in the inner lining of the blood vessels of the heart (coronary arteries). Plaque is made up of calcium, protein, and fatty substances. These deposits of plaque can partly clog and narrow the coronary arteries without producing any symptoms or warning signs. This puts a person at risk for a heart attack. This test is recommended for people who are at moderate risk for heart disease. The test can find plaque deposits before symptoms develop. Tell a health care provider about:  Any allergies you have.  All medicines you are taking, including vitamins, herbs, eye drops, creams, and over-the-counter medicines.  Any problems you or family members have had with anesthetic medicines.  Any blood disorders you have.  Any surgeries you have had.  Any medical conditions you have.  Whether you are pregnant or may be pregnant. What are the risks? Generally, this is a safe procedure. However, problems may occur, including:  Harm to a pregnant woman and her unborn baby. This test involves the use of radiation. Radiation exposure can be dangerous to a pregnant woman and her unborn baby. If you are pregnant or think you may be pregnant, you should not have this procedure done.  Slight increase in the risk of cancer. This is because of the radiation involved in the test. What happens before the procedure? Ask your health care provider for any specific instructions on how to prepare for this procedure. You may be asked to avoid products that contain caffeine, tobacco, or nicotine for 4 hours before the procedure. What happens during the procedure?   You will undress and remove any jewelry from your neck or chest.  You will put on a hospital gown.  Sticky electrodes will be placed on your chest. The electrodes will be connected to an  electrocardiogram (ECG) machine to record a tracing of the electrical activity of your heart.  You will lie down on a curved bed that is attached to the [place].  You may be given medicine to slow down your heart rate so that clear pictures can be created.  You will be moved into the CT scanner, and the CT scanner will take pictures of your heart. During this time, you will be asked to lie still and hold your breath for 2-3 seconds at a time while each picture of your heart is being taken. The procedure may vary among health care providers and hospitals. What happens after the procedure?  You can get dressed.  You can return to your normal activities.  It is up to you to get the results of your procedure. Ask your health care provider, or the department that is doing the procedure, when your results will be ready. Summary  A coronary calcium scan is an imaging test used to look for deposits of plaque in the inner lining of the blood vessels of the heart (coronary arteries). Plaque is made up of calcium, protein, and fatty substances.  Generally, this is a safe procedure. Tell your health care provider if you are pregnant or may be pregnant.  Ask your health care provider for any specific instructions on how to prepare for this procedure.  A CT scanner will take pictures of your heart.  You can return to your normal activities after the scan is done. This information is not intended to replace  advice given to you by your health care provider. Make sure you discuss any questions you have with your health care provider. Document Revised: 02/24/2019 Document Reviewed: 02/24/2019 Elsevier Patient Education  [place].

## 2020-03-09 NOTE — Progress Notes (Signed)
Established Patient Office Visit  Subjective:  Patient ID: Tanya Houston, female    DOB: 1965/12/02  Age: 54 y.o. MRN: [id number]  CC:  Chief Complaint  Patient presents with  . Diabetes    pt is here to follow up on diabetes     HPI Tanya Houston presents for medical follow-up.  She has lost some weight since last visit.  She has some bilateral peripheral edema and her labs were reassuring.  No evidence for heart failure.  Renal function and thyroid function stable.  We started low-dose Lasix which she is taking infrequently and her edema is better.  She has not had any dyspnea with walking.  Regarding type 2 diabetes this is improving.  She has made further dietary changes.  She hopes to get her weight below 300 soon.  Last A1c was 6.4%.  Previous intolerance with Metformin.  She is on Victoza and tolerating well.  Requesting refills of Ventolin inhaler which she uses infrequently for wheezing.  This is triggered by allergy issues.  She does not wheeze even weekly.  Her current inhaler is out of date.  Mother died age 87 of reported sudden MI.  Tanya Houston had stress test couple years ago was unremarkable.  No recent chest pains.  No history of coronary calcium score.  She would like to consider further risk stratification  Past Medical History:  Diagnosis Date  . Anemia   . Anxiety    panic attacks  . Arthritis   . Asthma   . Complication of anesthesia    difficulty breathing for a few a minutes after a procedure  . Difficult intubation    pt unsure   . Environmental and seasonal allergies   . Family history of adverse reaction to anesthesia    " my son hard a hard time waking up "  . GERD (gastroesophageal reflux disease)   . Headache   . Helicobacter pylori gastritis   . Hernia, abdominal   . History of kidney stones   . Irregular heartbeat   . Pneumonia   . Recurrent sinus infections   . Type II diabetes mellitus ([place])     Past Surgical History:  Procedure  Laterality Date  . APPENDECTOMY    . BIOPSY ENDOMETRIAL    . COLONOSCOPY W/ BIOPSIES AND POLYPECTOMY    . ESOPHAGOGASTRODUODENOSCOPY (EGD) WITH PROPOFOL N/A 11/26/2016   Procedure: ESOPHAGOGASTRODUODENOSCOPY (EGD) WITH PROPOFOL;  Surgeon: Otis Brace, MD;  Location: [facility];  Service: Gastroenterology;  Laterality: N/A;  . ROTATOR CUFF REPAIR      Family History  Problem Relation Age of Onset  . Diabetes Mother   . Heart attack Mother   . Colon cancer Father   . Cervical cancer Sister   . Diabetes Sister   . Heart attack Sister   . Epilepsy Brother     Social History   Socioeconomic History  . Marital status: Married    Spouse name: Not on file  . Number of children: Not on file  . Years of education: Not on file  . Highest education level: Not on file  Occupational History  . Not on file  Tobacco Use  . Smoking status: Never Smoker  . Smokeless tobacco: Never Used  Vaping Use  . Vaping Use: Never used  Substance and Sexual Activity  . Alcohol use: Yes    Comment: occ  . Drug use: No  . Sexual activity: Not Currently    Partners: Male  Birth control/protection: None  Other Topics Concern  . Not on file  Social History Narrative  . Not on file   Social Determinants of Health   Financial Resource Strain:   . Difficulty of Paying Living Expenses:   Food Insecurity:   . Worried About Charity fundraiser in the Last Year:   . Arboriculturist in the Last Year:   Transportation Needs:   . Film/video editor (Medical):   Marland Kitchen Lack of Transportation (Non-Medical):   Physical Activity:   . Days of Exercise per Week:   . Minutes of Exercise per Session:   Stress:   . Feeling of Stress :   Social Connections:   . Frequency of Communication with Friends and Family:   . Frequency of Social Gatherings with Friends and Family:   . Attends Religious Services:   . Active Member of Clubs or Organizations:   . Attends Archivist Meetings:   Marland Kitchen  Marital Status:   Intimate Partner Violence:   . Fear of Current or Ex-Partner:   . Emotionally Abused:   Marland Kitchen Physically Abused:   . Sexually Abused:     Outpatient Medications Prior to Visit  Medication Sig Dispense Refill  . cetirizine (ZYRTEC) 10 MG tablet TAKE 1 TABLET BY MOUTH EVERY DAY 30 tablet 12  . Cholecalciferol (VITAMIN D3) 1.25 MG (50000 UT) CAPS TAKE 1 CAPSULE BY MOUTH ONE TIME PER WEEK 4 capsule 8  . ciclopirox (LOPROX) 0.77 % cream Apply topically 2 (two) times daily. 15 g 1  . doxepin (SINEQUAN) 10 MG capsule Take 1-2 tabs by mouth nightly for itching    . fluticasone (FLONASE) 50 MCG/ACT nasal spray Place 2 sprays into both nostrils daily. 16 g 6  . furosemide (LASIX) 20 MG tablet TAKE 2 TABLETS BY MOUTH ONCE DAILY AS NEEDED FOR EDEMA 60 tablet 1  . hydrocortisone (ANUSOL-HC) 25 MG suppository Place 25 mg rectally 2 (two) times daily as needed for hemorrhoids.     . hydrOXYzine (ATARAX/VISTARIL) 25 MG tablet Take 1 tablet (25 mg total) by mouth 3 (three) times daily as needed for itching. 90 tablet 3  . Insulin Pen Needle (B-D UF III MINI PEN NEEDLES) 31G X 5 MM MISC USE 1 NEEDLE DAILY FOR VICTOZA INJECTIONS. DX CODE E11.65 100 each 3  . lidocaine (XYLOCAINE) 5 % ointment Apply 1 application topically 4 (four) times daily as needed. 30 g 0  . Pitavastatin Calcium (LIVALO) 2 MG TABS Take one tablet daily 30 tablet 11  . PROCTOZONE-HC 2.5 % rectal cream USE TWICE DAILY FOR 1 WEEK, THEN ONCE A DAY FOR 1 WEEK , THEN EVERY OTHER DAY FOR ONE WEEK 28 g 0  . triamcinolone ointment (KENALOG) 0.5 % APPLY PEA SIZED AMOUNT TO AFFECTED AREA 2 XA DAY FOR 1 TO 2 WEEKS NOT FOR DAILY LONG TERM USE 30 g 1  . valACYclovir (VALTREX) 1000 MG tablet Take 1 tablet (1,000 mg total) by mouth 2 (two) times daily. Take for 10 days 20 tablet 0  . VICTOZA 18 MG/3ML SOPN INJECT 1.8 MG UNDER THE SKIN ONCE DAILY 3 pen 3  . albuterol (PROVENTIL HFA;VENTOLIN HFA) 108 (90 Base) MCG/ACT inhaler Inhale 2 puffs  into the lungs every 6 (six) hours as needed for wheezing or shortness of breath. 1 Inhaler 0   No facility-administered medications prior to visit.    Allergies  Allergen Reactions  . Adhesive [Tape] Hives    And Band Aids  .  Fish Allergy Shortness Of Breath and Itching  . Latex Hives  . Metformin And Related Itching  . Peanut Oil   . Sulfa Antibiotics Hives    ROS Review of Systems  Constitutional: Negative for fatigue.  Eyes: Negative for visual disturbance.  Respiratory: Negative for cough, chest tightness, shortness of breath and wheezing.   Cardiovascular: Negative for chest pain, palpitations and leg swelling.  Gastrointestinal: Negative for abdominal pain.  Endocrine: Negative for polydipsia and polyuria.  Neurological: Negative for dizziness, seizures, syncope, weakness, light-headedness and headaches.      Objective:    Physical Exam Constitutional:      Appearance: She is well-developed.  Eyes:     Pupils: Pupils are equal, round, and reactive to light.  Neck:     Thyroid: No thyromegaly.     Vascular: No JVD.  Cardiovascular:     Rate and Rhythm: Normal rate and regular rhythm.     Heart sounds: No gallop.   Pulmonary:     Effort: Pulmonary effort is normal. No respiratory distress.     Breath sounds: Normal breath sounds. No wheezing or rales.  Musculoskeletal:     Cervical back: Neck supple.     Right lower leg: No edema.     Left lower leg: No edema.  Neurological:     Mental Status: She is alert.     BP 120/64 (BP Location: Left Arm, Patient Position: Sitting, Cuff Size: Normal)   Pulse 100   Temp 97.8 F (36.6 C) (Oral)   Wt (!) 309 lb 11.2 oz (140.5 kg)   SpO2 99%   BMI 51.54 kg/m  Wt Readings from Last 3 Encounters:  03/09/20 (!) 309 lb 11.2 oz (140.5 kg)  01/15/20 (!) 315 lb (142.9 kg)  12/09/19 (!) 307 lb 4.8 oz (139.4 kg)     Health Maintenance Due  Topic Date Due  . COVID-19 Vaccine (1) Never done  . OPHTHALMOLOGY EXAM   07/30/2019  . URINE MICROALBUMIN  01/08/2020    There are no preventive care reminders to display for this patient.  Lab Results  Component Value Date   TSH 2.93 01/15/2020   Lab Results  Component Value Date   WBC 7.1 01/08/2019   HGB 11.3 (L) 01/08/2019   HCT 35.0 (L) 01/08/2019   MCV 76.4 (L) 01/08/2019   PLT 220.0 01/08/2019   Lab Results  Component Value Date   NA 137 01/15/2020   K 4.0 01/15/2020   CO2 26 01/15/2020   GLUCOSE 113 (H) 01/15/2020   BUN 10 01/15/2020   CREATININE 0.68 01/15/2020   BILITOT 0.6 04/24/2019   ALKPHOS 91 04/24/2019   AST 12 04/24/2019   ALT 12 04/24/2019   PROT 6.9 04/24/2019   ALBUMIN 3.7 04/24/2019   CALCIUM 9.3 01/15/2020   ANIONGAP 10 10/07/2017   GFR 109.57 01/15/2020   Lab Results  Component Value Date   CHOL 201 (H) 04/24/2019   Lab Results  Component Value Date   HDL 50.40 04/24/2019   Lab Results  Component Value Date   LDLCALC 135 (H) 04/24/2019   Lab Results  Component Value Date   TRIG 78.0 04/24/2019   Lab Results  Component Value Date   CHOLHDL 4 04/24/2019   Lab Results  Component Value Date   HGBA1C 6.1 (A) 03/09/2020      Assessment & Plan:   Problem List Items Addressed This Visit      Unprioritized   Controlled type 2 diabetes mellitus without  complication, without long-term current use of insulin (HCC) - Primary   Relevant Orders   POCT glycosylated hemoglobin (Hb A1C) (Completed)   Family history of premature coronary artery disease   Allergic rhinitis    Other Visit Diagnoses    Mild reactive airways disease, unspecified whether persistent       Relevant Medications   albuterol (VENTOLIN HFA) 108 (90 Base) MCG/ACT inhaler    Her diabetes is well controlled today with A1c 6.1%.  Congratulated to keep up the good work and we will discuss possible reduction in Victoza dose at 31-month follow-up if this is further improved  Refill Ventolin inhaler for as needed use  Discussed possible CT  coronary calcium score to further risk stratify and she does show interest in going ahead with that.  We will set that up  Meds ordered this encounter  Medications  . albuterol (VENTOLIN HFA) 108 (90 Base) MCG/ACT inhaler    Sig: Inhale 2 puffs into the lungs every 6 (six) hours as needed for wheezing or shortness of breath.    Dispense:  18 g    Refill:  0    Follow-up: Return in about 3 months (around 06/09/2020).    Carolann Littler, MD

## 2020-03-29 ENCOUNTER — Other Ambulatory Visit: Payer: Self-pay | Admitting: Family Medicine

## 2020-03-29 DIAGNOSIS — J45909 Unspecified asthma, uncomplicated: Secondary | ICD-10-CM

## 2020-06-10 ENCOUNTER — Ambulatory Visit: Payer: 59 | Admitting: Family Medicine

## 2020-06-17 ENCOUNTER — Other Ambulatory Visit: Payer: Self-pay | Admitting: Family Medicine

## 2020-06-17 DIAGNOSIS — L309 Dermatitis, unspecified: Secondary | ICD-10-CM

## 2020-06-20 ENCOUNTER — Telehealth: Payer: Self-pay | Admitting: Family Medicine

## 2020-06-20 ENCOUNTER — Encounter: Payer: Self-pay | Admitting: Family Medicine

## 2020-06-20 NOTE — Telephone Encounter (Deleted)
-----   Message from Venetia Constable sent at 06/10/2020  8:00 AM EDT ----- Regarding: Unruly patient -- 06/10/20- 7:12 am This morning [name] came in for her 7:00 am appointment at 7:12.  This was a 15 minute appointment.  I let her know that she may have to reschedule but that I would check with Dr. Elease Hashimoto.  When I came back out from talking to Dr. Elease Hashimoto, I informed the patient that she would have to reschedule.  She said "NO I'M NOT!" very loudly.  She stated she wanted a flu shot for work.  I explained to her that our flu shot appointment were scheduled out til November 17th  She hollered "I'M A [employer] EMPLOYEE!  I NEED A FLU SHOT FOR WORK!!"  She then asked to speak to "Dr. Shane Crutch himself".  I explained that Dr. Elease Hashimoto was with a patient that he couldn't come out, but that if she wanted to reschedule her appointment she would be able to see him.  She then asked for me to send his nurse out to the lobby.  His nurse had just took a patient back so I explained the nurse was with a patient.  The pt stated loudly "NO SHE'S NOT!" The patient is being loud and screaming at me very hateful by this point.  She is hollering everything she is saying to me. She asked to speak to the office manager.  I explained to her that she arrives at 8:00.  She asked for her name so I wrote it down for her.  Then she asked for my name.  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  People complain hen they are paying their bills about y'all!!"  SHE kept repeating to me that she was a [employer].  She stated to me about 5 different times "[place]!  JUST WAIT, YOU'LL BE SORRY, I'M TELLING YOU, YOU WILL BE SORRY!"  I tried to explained that I was sorry that she couldn't be seen today but she would just look at me and again scream "I'M GONNA MAKE SURE YOU ARE SORRY!  YOU'RE GONNA BE SORRY!!"   She finally told me that again and then left.  By this point [name] walked up front into my office area because she could hear the patient screaming all the way into the back patient rooms area.  [name] in the lab later told me she was listening too because she could hear her in the lab area.    This is the first time since I opened up by myself that I was legitimately scared.  Because of how she was acting and her telling me I was going to be sorry I didn't know if she was going to come back inside and try to do something to me and/or Dr. Elease Hashimoto and his nurse.   I let [name] know at this point what had happened so she immediately called me.  She informed [name] and [name] immediately called me as well. When [name] arrived she made sure I was ok as well.

## 2020-06-20 NOTE — Telephone Encounter (Deleted)
----- Message from Tanya Post, MD sent at 06/18/2020  6:51 PM EDT ----- Regarding: RE: Unruly patient -- 06/10/20- 7:12 am Tanya Houston,  I was out on vacation last week.   I did discuss briefly with Tanya Houston last Friday (22nd) on the day of incident.  From what Tanya Houston heard and observed I do agree with dismissal.  We cannot accept threatening behavior from anyone.   It is unfortunate.  We do our best to accommodate late patients.  She was at least 15 minutes late and we had an very full schedule and she was the first patient of the day.  It puts [place] in a predicament.  If we had seen her, every single patient on our morning schedule would have been late being seen. We have a late show policy for a very good reason.  From the observation of many of our staff, her behavior was threatening and inappropriate.  Tanya Houston ----- Message ----- From: Tanya Houston Sent: 06/13/2020   4:01 PM EDT To: Tanya Houston, Tanya Post, MD Subject: Melton Alar: Unruly patient -- 06/10/20- 7:12 am        Hi Dr. Elease Houston,   I wanted to see what your thoughts are on dismissing this patient. This is very inappropriate behavior. Please let me know what your thoughts are.   Thanks,  Tanya Houston ----- Message ----- From: Tanya Houston Sent: 06/10/2020   8:00 AM EDT To: Tanya Houston, Tanya Post, MD, # Subject: Unruly patient -- 06/10/20- 7:12 am            This morning Tanya Houston came in for her 7:00 am appointment at 7:12.  This was a 15 minute appointment.  I let her know that she may have to reschedule but that I would check with Dr. Elease Houston.  When I came back out from talking to Dr. Elease Houston, I informed the patient that she would have to reschedule.  She said "NO I'M NOT!" very loudly.  She stated she wanted a flu shot for work.  I explained to her that our flu shot appointment were scheduled out til November 17th  She hollered "I'M A [employer] EMPLOYEE!  I NEED A FLU SHOT FOR WORK!!"  She then asked to speak to "Dr.  Shane Houston himself".  I explained that Dr. Elease Houston was with a patient that he couldn't come out, but that if she wanted to reschedule her appointment she would be able to see him.  She then asked for me to send his nurse out to the lobby.  His nurse had just took a patient back so I explained the nurse was with a patient.  The pt stated loudly "NO SHE'S NOT!" The patient is being loud and screaming at me very hateful by this point.  She is hollering everything she is saying to me. She asked to speak to the office manager.  I explained to her that she arrives at 8:00.  She asked for her name so I wrote it down for her.  Then she asked for my name.  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  People complain hen they are paying their bills about y'all!!"  SHE kept repeating to me that she was a [employer].  She stated to me about 5 different times "[place]!  JUST WAIT, YOU'LL BE SORRY, I'M TELLING YOU,  YOU WILL BE SORRY!"  I tried to explained that I was sorry that she couldn't be seen today but she would just look at me and again scream "I'M GONNA MAKE SURE YOU ARE SORRY!  YOU'RE GONNA BE SORRY!!"  She finally told me that again and then left.  By this point Tanya Houston walked up front into my office area because she could hear the patient screaming all the way into the back patient rooms area.  Tanya Houston in the lab later told me she was listening too because she could hear her in the lab area.    This is the first time since I opened up by myself that I was legitimately scared.  Because of how she was acting and her telling me I was going to be sorry I didn't know if she was going to come back inside and try to do something to me and/or Dr. Elease Houston and his nurse.   I let Tanya Houston know at this point what had happened so she immediately called me.  She informed Tanya Houston and Tanya Houston immediately  called me as well. When Tanya Houston arrived she made sure I was ok as well.

## 2020-06-20 NOTE — Telephone Encounter (Signed)
Patient is being dismissed per Dr. Elease Hashimoto due to inappropriate and threatening behavior. She arrived late for her appointment on 07/11/20 and was told by the front office staff that she would need to reschedule after confirming with Dr. Elease Hashimoto. The patient very loudly started telling the staff member several times that "You are going to be sorry for this, just wait, you will be sorry. I am telling you that you will be sorry. I am going to make sure that you are sorry. You are going to be so sorry."The staff member was very shaken up by this incident and the inappropriate and threatening  behavior was observed and overheard by several  staff members.

## 2020-10-04 ENCOUNTER — Other Ambulatory Visit: Payer: Self-pay | Admitting: Family Medicine

## 2020-12-05 ENCOUNTER — Other Ambulatory Visit: Payer: Self-pay

## 2020-12-08 ENCOUNTER — Other Ambulatory Visit: Payer: Self-pay | Admitting: Obstetrics and Gynecology

## 2020-12-08 DIAGNOSIS — R928 Other abnormal and inconclusive findings on diagnostic imaging of breast: Secondary | ICD-10-CM

## 2021-01-04 ENCOUNTER — Other Ambulatory Visit: Payer: Self-pay | Admitting: Family Medicine

## 2021-01-09 ENCOUNTER — Other Ambulatory Visit: Payer: Self-pay

## 2021-01-09 ENCOUNTER — Ambulatory Visit: Payer: 59

## 2021-01-09 ENCOUNTER — Ambulatory Visit
Admission: RE | Admit: 2021-01-09 | Discharge: 2021-01-09 | Disposition: A | Discharge status: Home / Self Care | Payer: 59 | Source: Ambulatory Visit | Attending: Obstetrics and Gynecology | Admitting: Obstetrics and Gynecology

## 2021-01-09 DIAGNOSIS — R928 Other abnormal and inconclusive findings on diagnostic imaging of breast: Secondary | ICD-10-CM

## 2021-01-19 ENCOUNTER — Other Ambulatory Visit: Payer: Self-pay | Admitting: Obstetrics and Gynecology

## 2021-01-19 ENCOUNTER — Other Ambulatory Visit: Payer: Self-pay | Admitting: Family Medicine

## 2021-01-19 DIAGNOSIS — N939 Abnormal uterine and vaginal bleeding, unspecified: Secondary | ICD-10-CM

## 2021-01-19 DIAGNOSIS — L309 Dermatitis, unspecified: Secondary | ICD-10-CM

## 2021-01-31 ENCOUNTER — Other Ambulatory Visit: Payer: Self-pay

## 2021-03-02 ENCOUNTER — Other Ambulatory Visit: Payer: Self-pay | Admitting: Obstetrics and Gynecology

## 2021-03-21 NOTE — Progress Notes (Signed)
Pt. Needs orders for surgery.PAT and labs on: 03/23/21.

## 2021-03-21 NOTE — Patient Instructions (Signed)
DUE TO COVID-19 ONLY ONE VISITOR IS ALLOWED TO COME WITH YOU AND STAY IN THE WAITING ROOM ONLY DURING PRE OP AND PROCEDURE DAY OF SURGERY. THE 1 VISITOR  MAY VISIT WITH YOU AFTER SURGERY IN YOUR PRIVATE ROOM DURING VISITING HOURS ONLY!               Tanya Houston   Your procedure is scheduled on: 04/05/21   Report to [facility] Main  Entrance   Report to admitting at: 11:30 AM     Call this number if you have problems the morning of surgery [phone]    Remember: Do not eat solid food :After Midnight. Clear liquids until: 10:30 am.   CLEAR LIQUID DIET  Foods Allowed                                                                     Foods Excluded  Coffee and tea, regular and decaf                             liquids that you cannot  Plain Jell-O any favor except red or purple                                           see through such as: Fruit ices (not with fruit pulp)                                     milk, soups, orange juice  Iced Popsicles                                    All solid food Carbonated beverages, regular and diet                                    Cranberry, grape and apple juices Sports drinks like Gatorade Lightly seasoned clear broth or consume(fat free) Sugar, honey syrup  Sample Menu Breakfast                                Lunch                                     Supper Cranberry juice                    Beef broth                            Chicken broth Jell-O                                     Grape  juice                           Apple juice Coffee or tea                        Jell-O                                      Popsicle                                                Coffee or tea                        Coffee or tea  _____________________________________________________________________   BRUSH YOUR TEETH MORNING OF SURGERY AND RINSE YOUR MOUTH OUT, NO CHEWING GUM CANDY OR MINTS.    Take these medicines the morning of  surgery with A SIP OF WATER: pantoprazole.Use inhalers and flonase as usual.  How to Manage Your Diabetes Before and After Surgery  Why is it important to control my blood sugar before and after surgery? Improving blood sugar levels before and after surgery helps healing and can limit problems. A way of improving blood sugar control is eating a healthy diet by:  Eating less sugar and carbohydrates  Increasing activity/exercise  Talking with your doctor about reaching your blood sugar goals High blood sugars (greater than 180 mg/dL) can raise your risk of infections and slow your recovery, so you will need to focus on controlling your diabetes during the weeks before surgery. Make sure that the doctor who takes care of your diabetes knows about your planned surgery including the date and location.  How do I manage my blood sugar before surgery? Check your blood sugar at least 4 times a day, starting 2 days before surgery, to make sure that the level is not too high or low. Check your blood sugar the morning of your surgery when you wake up and every 2 hours until you get to the Short Stay unit. If your blood sugar is less than 70 mg/dL, you will need to treat for low blood sugar: Do not take insulin. Treat a low blood sugar (less than 70 mg/dL) with  cup of clear juice (cranberry or apple), 4 glucose tablets, OR glucose gel. Recheck blood sugar in 15 minutes after treatment (to make sure it is greater than 70 mg/dL). If your blood sugar is not greater than 70 mg/dL on recheck, call [phone] for further instructions. Report your blood sugar to the short stay nurse when you get to Short Stay.  If you are admitted to the hospital after surgery: Your blood sugar will be checked by the staff and you will probably be given insulin after surgery (instead of oral diabetes medicines) to make sure you have good blood sugar levels. The goal for blood sugar control after surgery is 80-180  mg/dL.   WHAT DO I DO ABOUT MY DIABETES MEDICATION?  The day of surgery, do not take other diabetes injectables, including Byetta (exenatide), Bydureon (exenatide ER), Victoza (liraglutide), or Trulicity (dulaglutide).  DO NOT TAKE ANY DIABETIC MEDICATIONS DAY OF YOUR SURGERY  You may not have any metal on your body including hair pins and              piercings  Do not wear jewelry, make-up, lotions, powders or perfumes, deodorant             Do not wear nail polish on your fingernails.  Do not shave  48 hours prior to surgery.    Do not bring valuables to the hospital. [place].  Contacts, dentures or bridgework may not be worn into surgery.  Leave suitcase in the car. After surgery it may be brought to your room.     Patients discharged the day of surgery will not be allowed to drive home. IF YOU ARE HAVING SURGERY AND GOING HOME THE SAME DAY, YOU MUST HAVE AN ADULT TO DRIVE YOU HOME AND BE WITH YOU FOR 24 HOURS. YOU MAY GO HOME BY TAXI OR UBER OR ORTHERWISE, BUT AN ADULT MUST ACCOMPANY YOU HOME AND STAY WITH YOU FOR 24 HOURS.  Name and phone number of your driver:  Special Instructions: N/A              Please read over the following fact sheets you were given: _____________________________________________________________________           [facility] - Preparing for Surgery Before surgery, you can play an important role.  Because skin is not sterile, your skin needs to be as free of germs as possible.  You can reduce the number of germs on your skin by washing with CHG (chlorahexidine gluconate) soap before surgery.  CHG is an antiseptic cleaner which kills germs and bonds with the skin to continue killing germs even after washing. Please DO NOT use if you have an allergy to CHG or antibacterial soaps.  If your skin becomes reddened/irritated stop using the CHG and inform your nurse when you arrive at  Short Stay. Do not shave (including legs and underarms) for at least 48 hours prior to the first CHG shower.  You may shave your face/neck. Please follow these instructions carefully:  1.  Shower with CHG Soap the night before surgery and the  morning of Surgery.  2.  If you choose to wash your hair, wash your hair first as usual with your  normal  shampoo.  3.  After you shampoo, rinse your hair and body thoroughly to remove the  shampoo.                           4.  Use CHG as you would any other liquid soap.  You can apply chg directly  to the skin and wash                       Gently with a scrungie or clean washcloth.  5.  Apply the CHG Soap to your body ONLY FROM THE NECK DOWN.   Do not use on face/ open                           Wound or open sores. Avoid contact with eyes, ears mouth and genitals (private parts).                       Wash face,  Development worker, international aid (private parts)  with your normal soap.             6.  Wash thoroughly, paying special attention to the area where your surgery  will be performed.  7.  Thoroughly rinse your body with warm water from the neck down.  8.  DO NOT shower/wash with your normal soap after using and rinsing off  the CHG Soap.                9.  Pat yourself dry with a clean towel.            10.  Wear clean pajamas.            11.  Place clean sheets on your bed the night of your first shower and do not  sleep with pets. Day of Surgery : Do not apply any lotions/deodorants the morning of surgery.  Please wear clean clothes to the hospital/surgery center.  FAILURE TO FOLLOW THESE INSTRUCTIONS MAY RESULT IN THE CANCELLATION OF YOUR SURGERY PATIENT SIGNATURE_________________________________  NURSE SIGNATURE__________________________________  ________________________________________________________________________

## 2021-03-23 ENCOUNTER — Encounter (HOSPITAL_COMMUNITY): Payer: Self-pay

## 2021-03-23 ENCOUNTER — Other Ambulatory Visit: Payer: Self-pay

## 2021-03-23 ENCOUNTER — Encounter (HOSPITAL_COMMUNITY)
Admission: RE | Admit: 2021-03-23 | Discharge: 2021-03-23 | Disposition: A | Discharge status: Home / Self Care | Payer: 59 | Source: Ambulatory Visit | Attending: Obstetrics and Gynecology | Admitting: Obstetrics and Gynecology

## 2021-03-23 DIAGNOSIS — Z01818 Encounter for other preprocedural examination: Secondary | ICD-10-CM | POA: Insufficient documentation

## 2021-03-23 LAB — CBC
HCT: 40.1 % (ref 36.0–46.0)
Hemoglobin: 12.3 g/dL (ref 12.0–15.0)
MCH: 24.7 pg — ABNORMAL LOW (ref 26.0–34.0)
MCHC: 30.7 g/dL (ref 30.0–36.0)
MCV: 80.7 fL (ref 80.0–100.0)
Platelets: 246 10*3/uL (ref 150–400)
RBC: 4.97 MIL/uL (ref 3.87–5.11)
RDW: 14.9 % (ref 11.5–15.5)
WBC: 5.8 10*3/uL (ref 4.0–10.5)
nRBC: 0 % (ref 0.0–0.2)

## 2021-03-23 LAB — GLUCOSE, CAPILLARY: Glucose-Capillary: 122 mg/dL — ABNORMAL HIGH (ref 70–99)

## 2021-03-23 LAB — BASIC METABOLIC PANEL
Anion gap: 8 (ref 5–15)
BUN: 14 mg/dL (ref 6–20)
CO2: 27 mmol/L (ref 22–32)
Calcium: 9.6 mg/dL (ref 8.9–10.3)
Chloride: 104 mmol/L (ref 98–111)
Creatinine, Ser: 0.75 mg/dL (ref 0.44–1.00)
GFR, Estimated: >60 mL/min (ref >60)
Glucose, Bld: 106 mg/dL — ABNORMAL HIGH (ref 70–99)
Potassium: 4 mmol/L (ref 3.5–5.1)
Sodium: 139 mmol/L (ref 135–145)

## 2021-03-23 NOTE — Progress Notes (Signed)
COVID Vaccine Completed: Yes Date COVID Vaccine completed: 02/10/21 x 2 COVID vaccine manufacturer: Pfizer      PCP - Dr. Jaquelyn Bitter. LOV: 01/30/21 Cardiologist -   Chest x-ray -  EKG -  Stress Test -  ECHO -  Cardiac Cath -  Pacemaker/ICD device last checked: A1-C: 6.9: 01/30/21 Sleep Study -  CPAP -   Fasting Blood Sugar - N/A Checks Blood Sugar ___0__ times a day  Blood Thinner Instructions: Aspirin Instructions: Last Dose:  Anesthesia review:   Patient denies shortness of breath, fever, cough and chest pain at PAT appointment   Patient verbalized understanding of instructions that were given to them at the PAT appointment. Patient was also instructed that they will need to review over the PAT instructions again at home before surgery.

## 2021-03-23 NOTE — Progress Notes (Signed)
   03/23/21 0824  OBSTRUCTIVE SLEEP APNEA  Have you ever been diagnosed with sleep apnea through a sleep study? No  Do you snore loudly (loud enough to be heard through closed doors)?  1  Do you often feel tired, fatigued, or sleepy during the daytime (such as falling asleep during driving or talking to someone)? 0  Has anyone observed you stop breathing during your sleep? 1  Do you have, or are you being treated for high blood pressure? 0  BMI more than 35 kg/m2? 1  Age > 50 (1-yes) 1  Neck circumference greater than:Female 16 inches or larger, Female 17inches or larger? 1  Female Gender (Yes=1) 0  Obstructive Sleep Apnea Score 5  Score 5 or greater  Results sent to PCP

## 2021-04-04 NOTE — H&P (Signed)
   Reason for admission:   Hysteroscopy with polypectomy History:    Tanya Godwinis a87 y.o.  female, G3P2,  presenting today to undergo hysteroscopy with polypectomy. Patient was evaluated at [facility]  02/22/21 for post-menopausal bleeding.  02/11/21 heavy bleeding episode with quarter-size clots requiring 1 super pad every 2 hours. Lasted 3-5 days. Bleeding resolved with NE '5mg'$  daily used for 14 days. No recurrence. Previous LMP: 06/2018 Endometrial biopsy 01/31/21: benign endometrial polyp with no hyperplasia or malignancy  TVUS @ [facility]: Normal uterine volume, #3 measurable fibroids <2 cm, normal ovaries, EM 8.68 mm  Review of system:  Non-contributory   Past Medical History:   Past Medical History:  Diagnosis Date   Anemia    Anxiety    panic attacks   Arthritis    Asthma    Complication of anesthesia    difficulty breathing for a few a minutes after a procedure   Difficult intubation    pt unsure    Environmental and seasonal allergies    Family history of adverse reaction to anesthesia    " my son hard a hard time waking up "   GERD (gastroesophageal reflux disease)    Headache    Helicobacter pylori gastritis    Hernia, abdominal    History of kidney stones    Irregular heartbeat    Pneumonia    Recurrent sinus infections    Type II diabetes mellitus ([place])     Allergies:   Allergies  Allergen Reactions   Adhesive [Tape] Hives    And Band Aids   Fish Allergy Shortness Of Breath and Itching   Latex Hives   Metformin And Related Itching   Peanut Oil     Itchy, and sneezes    Sulfa Antibiotics Hives    Social History:  Married, non-smoker, currently employed.   Family History:   Mother: diabetes, CAD, stroke Father: colon cancer Sister: diabetes, cervical cancer  Physical exam:    General Appearance: Alert, appropriate appearance for age. No acute distress Neck / Thyroid: Supple, no masses, nodes or enlargement Lungs: clear to auscultation  bilaterally Back: No CVA tenderness. Cardiovascular: Regular rate and rhythm. S1, S2, no murmur Gastrointestinal: Soft, non-tender, no masses or organomegaly Pelvic Exam: Vulva and vagina appear normal. Bimanual exam, limited by body habitus,  reveals normal uterus and adnexa.   Assessment:   Post-menopausal bleeding with suspicion of endometrial polyp Morbid obesity: BMI 51 Type 2 diabetes asthma   Plan:    Proceed with Hysteroscopy / polypectomy/ D&C. Procedure, risks and benefits reviewed with patient including but not limited to bleeding, infection, injury to uterus and subsequently to intra-abdominal organs and expected recovery. Patient voices understanding and is agreeable to proceed.      Delsa Bern A MD

## 2021-04-05 ENCOUNTER — Other Ambulatory Visit: Payer: Self-pay

## 2021-04-05 ENCOUNTER — Encounter (HOSPITAL_COMMUNITY): Payer: Self-pay | Admitting: Obstetrics and Gynecology

## 2021-04-05 ENCOUNTER — Ambulatory Visit (HOSPITAL_COMMUNITY)
Admission: RE | Admit: 2021-04-05 | Discharge: 2021-04-05 | Disposition: A | Discharge status: Home / Self Care | Payer: 59 | Source: Ambulatory Visit | Attending: Obstetrics and Gynecology | Admitting: Obstetrics and Gynecology

## 2021-04-05 ENCOUNTER — Ambulatory Visit (HOSPITAL_COMMUNITY): Payer: 59 | Admitting: Registered Nurse

## 2021-04-05 ENCOUNTER — Encounter (HOSPITAL_COMMUNITY)
Admission: RE | Disposition: A | Discharge status: Home / Self Care | Payer: Self-pay | Source: Ambulatory Visit | Attending: Obstetrics and Gynecology

## 2021-04-05 DIAGNOSIS — Z888 Allergy status to other drugs, medicaments and biological substances status: Secondary | ICD-10-CM | POA: Diagnosis not present

## 2021-04-05 DIAGNOSIS — J45909 Unspecified asthma, uncomplicated: Secondary | ICD-10-CM | POA: Diagnosis not present

## 2021-04-05 DIAGNOSIS — Z6841 Body Mass Index (BMI) 40.0 and over, adult: Secondary | ICD-10-CM | POA: Diagnosis not present

## 2021-04-05 DIAGNOSIS — N950 Postmenopausal bleeding: Secondary | ICD-10-CM | POA: Diagnosis not present

## 2021-04-05 DIAGNOSIS — Z882 Allergy status to sulfonamides status: Secondary | ICD-10-CM | POA: Diagnosis not present

## 2021-04-05 DIAGNOSIS — N840 Polyp of corpus uteri: Secondary | ICD-10-CM | POA: Insufficient documentation

## 2021-04-05 DIAGNOSIS — N939 Abnormal uterine and vaginal bleeding, unspecified: Secondary | ICD-10-CM

## 2021-04-05 DIAGNOSIS — Z91040 Latex allergy status: Secondary | ICD-10-CM | POA: Diagnosis not present

## 2021-04-05 DIAGNOSIS — E119 Type 2 diabetes mellitus without complications: Secondary | ICD-10-CM | POA: Insufficient documentation

## 2021-04-05 HISTORY — PX: HYSTEROSCOPY WITH D & C: SHX1775

## 2021-04-05 LAB — GLUCOSE, CAPILLARY
Glucose-Capillary: 108 mg/dL — ABNORMAL HIGH (ref 70–99)
Glucose-Capillary: 86 mg/dL (ref 70–99)

## 2021-04-05 LAB — CBC
HCT: 39.7 % (ref 36.0–46.0)
Hemoglobin: 12.2 g/dL (ref 12.0–15.0)
MCH: 24.7 pg — ABNORMAL LOW (ref 26.0–34.0)
MCHC: 30.7 g/dL (ref 30.0–36.0)
MCV: 80.4 fL (ref 80.0–100.0)
Platelets: 238 10*3/uL (ref 150–400)
RBC: 4.94 MIL/uL (ref 3.87–5.11)
RDW: 14.6 % (ref 11.5–15.5)
WBC: 7.2 10*3/uL (ref 4.0–10.5)
nRBC: 0 % (ref 0.0–0.2)

## 2021-04-05 LAB — BASIC METABOLIC PANEL
Anion gap: 8 (ref 5–15)
BUN: 12 mg/dL (ref 6–20)
CO2: 26 mmol/L (ref 22–32)
Calcium: 9.3 mg/dL (ref 8.9–10.3)
Chloride: 104 mmol/L (ref 98–111)
Creatinine, Ser: 0.84 mg/dL (ref 0.44–1.00)
GFR, Estimated: >60 mL/min (ref >60)
Glucose, Bld: 101 mg/dL — ABNORMAL HIGH (ref 70–99)
Potassium: 5.7 mmol/L — ABNORMAL HIGH (ref 3.5–5.1)
Sodium: 138 mmol/L (ref 135–145)

## 2021-04-05 SURGERY — DILATATION AND CURETTAGE /HYSTEROSCOPY
Anesthesia: General

## 2021-04-05 MED ORDER — SILVER NITRATE-POT NITRATE 75-25 % EX MISC
CUTANEOUS | Status: AC
  Filled 2021-04-05: qty 10

## 2021-04-05 MED ORDER — DEXMEDETOMIDINE (PRECEDEX) IN NS 20 MCG/5ML (4 MCG/ML) IV SYRINGE
PREFILLED_SYRINGE | INTRAVENOUS | Status: AC
  Filled 2021-04-05: qty 5

## 2021-04-05 MED ORDER — DEXMEDETOMIDINE (PRECEDEX) IN NS 20 MCG/5ML (4 MCG/ML) IV SYRINGE
PREFILLED_SYRINGE | INTRAVENOUS | Status: DC | PRN
  Administered 2021-04-05 (×2): 8 ug via INTRAVENOUS

## 2021-04-05 MED ORDER — PROPOFOL 10 MG/ML IV BOLUS
INTRAVENOUS | Status: AC
  Filled 2021-04-05: qty 20

## 2021-04-05 MED ORDER — ARTIFICIAL TEARS OPHTHALMIC OINT
TOPICAL_OINTMENT | OPHTHALMIC | Status: AC
  Filled 2021-04-05: qty 3.5

## 2021-04-05 MED ORDER — ONDANSETRON HCL 4 MG/2ML IJ SOLN
INTRAMUSCULAR | Status: DC | PRN
  Administered 2021-04-05: 4 mg via INTRAVENOUS

## 2021-04-05 MED ORDER — FENTANYL CITRATE (PF) 100 MCG/2ML IJ SOLN
INTRAMUSCULAR | Status: DC | PRN
  Administered 2021-04-05: 50 ug via INTRAVENOUS
  Administered 2021-04-05: 100 ug via INTRAVENOUS

## 2021-04-05 MED ORDER — DEXAMETHASONE SODIUM PHOSPHATE 10 MG/ML IJ SOLN
INTRAMUSCULAR | Status: DC | PRN
  Administered 2021-04-05: 10 mg via INTRAVENOUS

## 2021-04-05 MED ORDER — PROPOFOL 500 MG/50ML IV EMUL
INTRAVENOUS | Status: DC | PRN
  Administered 2021-04-05: 100 ug/kg/min via INTRAVENOUS

## 2021-04-05 MED ORDER — MIDAZOLAM HCL 5 MG/5ML IJ SOLN
INTRAMUSCULAR | Status: DC | PRN
  Administered 2021-04-05: 2 mg via INTRAVENOUS

## 2021-04-05 MED ORDER — ENSURE PRE-SURGERY PO LIQD
296.0000 mL | Freq: Once | ORAL | Status: DC
  Filled 2021-04-05: qty 296

## 2021-04-05 MED ORDER — LIDOCAINE 2% (20 MG/ML) 5 ML SYRINGE
INTRAMUSCULAR | Status: DC | PRN
Start: 2021-04-05 — End: 2021-04-05
  Administered 2021-04-05: 100 mg via INTRAVENOUS

## 2021-04-05 MED ORDER — PHENYLEPHRINE 40 MCG/ML (10ML) SYRINGE FOR IV PUSH (FOR BLOOD PRESSURE SUPPORT)
PREFILLED_SYRINGE | INTRAVENOUS | Status: AC
  Filled 2021-04-05: qty 10

## 2021-04-05 MED ORDER — ACETAMINOPHEN 500 MG PO TABS
1000.0000 mg | ORAL_TABLET | ORAL | Status: AC
  Administered 2021-04-05: 1000 mg via ORAL

## 2021-04-05 MED ORDER — FENTANYL CITRATE (PF) 250 MCG/5ML IJ SOLN
INTRAMUSCULAR | Status: AC
  Filled 2021-04-05: qty 5

## 2021-04-05 MED ORDER — POVIDONE-IODINE 10 % EX SWAB
2.0000 "application " | Freq: Once | CUTANEOUS | Status: AC
  Administered 2021-04-05: 2 via TOPICAL

## 2021-04-05 MED ORDER — ONDANSETRON HCL 4 MG/2ML IJ SOLN
INTRAMUSCULAR | Status: AC
  Filled 2021-04-05: qty 2

## 2021-04-05 MED ORDER — LACTATED RINGERS IV SOLN: INTRAVENOUS | Status: DC

## 2021-04-05 MED ORDER — MIDAZOLAM HCL 2 MG/2ML IJ SOLN
INTRAMUSCULAR | Status: AC
  Filled 2021-04-05: qty 2

## 2021-04-05 MED ORDER — GABAPENTIN 300 MG PO CAPS
300.0000 mg | ORAL_CAPSULE | ORAL | Status: AC
  Administered 2021-04-05: 300 mg via ORAL
  Filled 2021-04-05: qty 1

## 2021-04-05 MED ORDER — CEFAZOLIN IN SODIUM CHLORIDE 3-0.9 GM/100ML-% IV SOLN
3.0000 g | INTRAVENOUS | Status: AC
  Administered 2021-04-05: 3 g via INTRAVENOUS
  Filled 2021-04-05: qty 100

## 2021-04-05 MED ORDER — DEXAMETHASONE SODIUM PHOSPHATE 10 MG/ML IJ SOLN
INTRAMUSCULAR | Status: AC
  Filled 2021-04-05: qty 1

## 2021-04-05 MED ORDER — LIDOCAINE 2% (20 MG/ML) 5 ML SYRINGE
INTRAMUSCULAR | Status: AC
  Filled 2021-04-05: qty 5

## 2021-04-05 MED ORDER — CHLOROPROCAINE HCL 50 MG/5ML IT SOLN
INTRATHECAL | Status: AC
  Filled 2021-04-05: qty 5

## 2021-04-05 MED ORDER — SCOPOLAMINE 1 MG/3DAYS TD PT72
1.0000 | MEDICATED_PATCH | TRANSDERMAL | Status: DC
  Administered 2021-04-05: 1.5 mg via TRANSDERMAL
  Filled 2021-04-05: qty 1

## 2021-04-05 MED ORDER — ACETAMINOPHEN 500 MG PO TABS
1000.0000 mg | ORAL_TABLET | ORAL | Status: DC
  Filled 2021-04-05: qty 2

## 2021-04-05 MED ORDER — PROPOFOL 10 MG/ML IV BOLUS
INTRAVENOUS | Status: DC | PRN
  Administered 2021-04-05: 300 mg via INTRAVENOUS

## 2021-04-05 MED ORDER — HYDROMORPHONE HCL 1 MG/ML IJ SOLN: 0.2500 mg | INTRAMUSCULAR | Status: DC | PRN

## 2021-04-05 MED ORDER — CHLORHEXIDINE GLUCONATE 0.12 % MT SOLN
15.0000 mL | Freq: Once | OROMUCOSAL | Status: AC
  Administered 2021-04-05: 15 mL via OROMUCOSAL

## 2021-04-05 MED ORDER — LIDOCAINE 2% (20 MG/ML) 5 ML SYRINGE
INTRAMUSCULAR | Status: DC | PRN
  Administered 2021-04-05: 1.5 mg/kg/h via INTRAVENOUS

## 2021-04-05 MED ORDER — ORAL CARE MOUTH RINSE
15.0000 mL | Freq: Once | OROMUCOSAL | Status: AC
Start: 2021-04-05 — End: 2021-04-05

## 2021-04-05 MED ORDER — CHLOROPROCAINE HCL 1 % IJ SOLN
INTRAMUSCULAR | Status: DC | PRN
  Administered 2021-04-05: 10 mL

## 2021-04-05 MED ORDER — PHENYLEPHRINE HCL (PRESSORS) 10 MG/ML IV SOLN
INTRAVENOUS | Status: DC | PRN
  Administered 2021-04-05: 160 ug via INTRAVENOUS
  Administered 2021-04-05: 80 ug via INTRAVENOUS
  Administered 2021-04-05: 120 ug via INTRAVENOUS

## 2021-04-05 SURGICAL SUPPLY — 18 items
BAG COUNTER SPONGE SURGICOUNT (BAG) IMPLANT
BAG SPNG CNTER NS LX DISP (BAG)
CATH ROBINSON RED A/P 16FR (CATHETERS) ×2 IMPLANT
CATH SILICONE 16FRX5CC (CATHETERS) ×2 IMPLANT
CLOTH BEACON ORANGE TIMEOUT ST (SAFETY) ×2 IMPLANT
COVER SURGICAL LIGHT HANDLE (MISCELLANEOUS) ×2 IMPLANT
DILATOR CANAL MILEX (MISCELLANEOUS) IMPLANT
DRSG PAD ABDOMINAL 8X10 ST (GAUZE/BANDAGES/DRESSINGS) ×2 IMPLANT
GLOVE SURG POLYISO LF SZ6.5 (GLOVE) ×4 IMPLANT
GOWN STRL REUS W/TWL LRG LVL3 (GOWN DISPOSABLE) ×2 IMPLANT
IV NS IRRIG 3000ML ARTHROMATIC (IV SOLUTION) ×2 IMPLANT
KIT PROCEDURE FLUENT (KITS) ×2 IMPLANT
KIT TURNOVER KIT A (KITS) ×2 IMPLANT
MANIFOLD NEPTUNE II (INSTRUMENTS) ×2 IMPLANT
PACK VAGINAL MINOR WOMEN LF (CUSTOM PROCEDURE TRAY) ×2 IMPLANT
PAD OB MATERNITY 4.3X12.25 (PERSONAL CARE ITEMS) ×2 IMPLANT
PAD PREP 24X48 CUFFED NSTRL (MISCELLANEOUS) ×2 IMPLANT
TOWEL OR 17X26 10 PK STRL BLUE (TOWEL DISPOSABLE) ×2 IMPLANT

## 2021-04-05 NOTE — Interval H&P Note (Signed)
History and Physical Interval Note:  04/05/2021 1:31 PM  Tanya Houston  has presented today for surgery, with the diagnosis of DYSFUNCTIONAL UTERINE BLEEDING.  The various methods of treatment have been discussed with the patient and family. After consideration of risks, benefits and other options for treatment, the patient has consented to  Procedure(s): DILATATION AND CURETTAGE /HYSTEROSCOPY (N/A) CERVICAL POLYPECTOMY (N/A) as a surgical intervention.  The patient's history has been reviewed, patient examined, no change in status, stable for surgery.  I have reviewed the patient's chart and labs.  Questions were answered to the patient's satisfaction.     Katharine Look A Moksha Dorgan

## 2021-04-05 NOTE — Discharge Instructions (Signed)
POST-OPERATIVE INSTRUCTIONS TO PATIENT  Call [facility]  [phone])  for excessive pain, bleeding or temperature greater than or equal to 100.4 degrees (orally).    No driving for 24 hours No sexual activity for 1 week  Pain management:  Use  over the counter Ibuprofen or Acetaminophen  as needed.      Diet: normal  Bathing: may shower day after surgery  Wound Care: n/a  Return to Dr. Cletis Media on 04/19/21 at 4:15 pm  Return to work: Monday 04/10/21    Dede Query Mounir Skipper MD 04/05/2021 2:46 PM

## 2021-04-05 NOTE — Progress Notes (Signed)
Patient refused urine pregnancy test, states she "has not had a period in 2 years".

## 2021-04-05 NOTE — Transfer of Care (Signed)
Immediate Anesthesia Transfer of Care Note  Patient: Tanya Houston  Procedure(s) Performed: DILATATION AND CURETTAGE /HYSTEROSCOPY  Patient Location: PACU  Anesthesia Type:General  Level of Consciousness: awake, alert , oriented and patient cooperative  Airway & Oxygen Therapy: Patient Spontanous Breathing and Patient connected to face mask oxygen  Post-op Assessment: Report given to RN, Post -op Vital signs reviewed and stable and Patient moving all extremities X 4  Post vital signs: stable  Last Vitals:  Vitals Value Taken Time  BP 119/73 04/05/21 1530  Temp 36.2 C 04/05/21 1530  Pulse 54 04/05/21 1539  Resp 14 04/05/21 1539  SpO2 95 % 04/05/21 1539  Vitals shown include unvalidated device data.  Last Pain:  Vitals:   04/05/21 1530  TempSrc:   PainSc: 0-No pain         Complications: No notable events documented.

## 2021-04-05 NOTE — Anesthesia Procedure Notes (Signed)
Procedure Name: LMA Insertion Date/Time: 04/05/2021 1:53 PM Performed by: Lissa Morales, CRNA Pre-anesthesia Checklist: Patient identified, Emergency Drugs available, Suction available and Patient being monitored Patient Re-evaluated:Patient Re-evaluated prior to induction Oxygen Delivery Method: Circle system utilized Preoxygenation: Pre-oxygenation with 100% oxygen Induction Type: IV induction LMA: LMA with gastric port inserted LMA Size: 4.0 Tube type: Oral Number of attempts: 1 Placement Confirmation: positive ETCO2 Tube secured with: Tape Dental Injury: Teeth and Oropharynx as per pre-operative assessment

## 2021-04-05 NOTE — Anesthesia Postprocedure Evaluation (Signed)
Anesthesia Post Note  Patient: Tanya Houston  Procedure(s) Performed: DILATATION AND CURETTAGE /HYSTEROSCOPY     Patient location during evaluation: PACU Anesthesia Type: General Level of consciousness: awake and alert Pain management: pain level controlled Vital Signs Assessment: post-procedure vital signs reviewed and stable Respiratory status: spontaneous breathing, nonlabored ventilation, respiratory function stable and patient connected to nasal cannula oxygen Cardiovascular status: blood pressure returned to baseline and stable Postop Assessment: no apparent nausea or vomiting Anesthetic complications: no   No notable events documented.  Last Vitals:  Vitals:   04/05/21 1515 04/05/21 1530  BP: 120/66 119/73  Pulse: (!) 58 (!) 56  Resp: 13 14  Temp:  (!) 36.2 C  SpO2: 98% 96%    Last Pain:  Vitals:   04/05/21 1530  TempSrc:   PainSc: 0-No pain                 Tiajuana Amass

## 2021-04-05 NOTE — Anesthesia Preprocedure Evaluation (Addendum)
Anesthesia Evaluation  Patient identified by MRN, date of birth, ID band Patient awake    Reviewed: Allergy & Precautions, H&P , NPO status , Patient's Chart, lab work & pertinent test results  History of Anesthesia Complications (+) DIFFICULT AIRWAY  Airway Mallampati: III  TM Distance: >3 FB Neck ROM: Full    Dental no notable dental hx. (+) Teeth Intact, Dental Advisory Given   Pulmonary asthma ,    Pulmonary exam normal breath sounds clear to auscultation       Cardiovascular negative cardio ROS   Rhythm:Regular Rate:Normal     Neuro/Psych  Headaches, Anxiety    GI/Hepatic Neg liver ROS, GERD  ,  Endo/Other  diabetes, Insulin DependentMorbid obesity  Renal/GU negative Renal ROS  negative genitourinary   Musculoskeletal  (+) Arthritis , Osteoarthritis,    Abdominal   Peds  Hematology  (+) Blood dyscrasia, anemia ,   Anesthesia Other Findings   Reproductive/Obstetrics negative OB ROS                            Anesthesia Physical Anesthesia Plan  ASA: 3  Anesthesia Plan: General   Post-op Pain Management:    Induction: Intravenous  PONV Risk Score and Plan: 4 or greater and Ondansetron, Dexamethasone and Midazolam  Airway Management Planned: Oral ETT  Additional Equipment:   Intra-op Plan:   Post-operative Plan: Extubation in OR  Informed Consent: I have reviewed the patients History and Physical, chart, labs and discussed the procedure including the risks, benefits and alternatives for the proposed anesthesia with the patient or authorized representative who has indicated his/her understanding and acceptance.     Dental advisory given  Plan Discussed with: CRNA  Anesthesia Plan Comments:       Anesthesia Quick Evaluation

## 2021-04-05 NOTE — Op Note (Signed)
Preop diagnosis:Abnormal uterine bleeding with endometrial polyps  Postop diagnosis: same  Anesthesia: sedation with LMA  Anesthesiologist: Dr. Ola Spurr  Procedure: Hysteroscopy with dilatation and curettage  Surgeon: Dr. Katharine Look Massiel Stipp  Procedure: After being informed of the planned procedure with possible complications including bleeding, infection and uterine perforation, informed consent was obtained and patient was taken to or #8.  She was given sedation with LMA anesthesia without complication. She was placed in a dorsal decubitus position, prepped and draped in the sterile fashion and her bladder was emptied with in-and-out catheter. Pelvic exam reveals anteverted uterus with no pelvic mass.  A weighted speculum is inserted in the vagina. The cervix was grasped with a tenaculum forcep placed on the anterior lip.We proceed with a paracervical block using 1% Nesacaine, 10 cc. Uterus is sounded at 9. The cervix is then easily dilated using Pratt dilator until # 19. This allows for easy placement of a diagnostic hysteroscope. With perfusion of Normal Saline at a maximum pressure of 90 mmHg, we are able to evaluate the entire uterine cavity.  Observation: 4 polyps are identified within the uterine cavity at the left fundus and left uterine wall estimated 0.5-1 cm in size. Both tubal ostia are seen. The underlying endometrium appears normal.   We then removed our instrumentation. Using a sharp curette, we proceed with curettage of the endometrial cavity which returns all previously mentioned endometrial polyps.   Instruments are then removed. Instrument and sponge count is complete x2. Estimated blood loss is minimal. Water deficit is 160 cc of NS.  The procedure is very well tolerated by the patient who is taken to recovery room in a well and stable condition.  Specimen: Endometrial polyps and endometrial curettings sent to pathology.

## 2021-04-06 ENCOUNTER — Encounter (HOSPITAL_COMMUNITY): Payer: Self-pay | Admitting: Obstetrics and Gynecology

## 2021-04-06 LAB — SURGICAL PATHOLOGY

## 2021-04-06 LAB — RPR: RPR Ser Ql: NONREACTIVE

## 2021-06-25 ENCOUNTER — Other Ambulatory Visit: Payer: Self-pay | Admitting: Family Medicine

## 2021-07-03 NOTE — Telephone Encounter (Signed)
Erroneous encounter

## 2021-07-06 ENCOUNTER — Other Ambulatory Visit: Payer: Self-pay | Admitting: Family Medicine

## 2021-07-06 DIAGNOSIS — L309 Dermatitis, unspecified: Secondary | ICD-10-CM

## 2021-07-20 DIAGNOSIS — Z0289 Encounter for other administrative examinations: Secondary | ICD-10-CM

## 2021-08-23 ENCOUNTER — Ambulatory Visit (INDEPENDENT_AMBULATORY_CARE_PROVIDER_SITE_OTHER): Payer: 59 | Admitting: Family Medicine

## 2021-08-23 ENCOUNTER — Encounter (INDEPENDENT_AMBULATORY_CARE_PROVIDER_SITE_OTHER): Payer: Self-pay | Admitting: Family Medicine

## 2021-08-23 ENCOUNTER — Other Ambulatory Visit: Payer: Self-pay

## 2021-08-23 VITALS — BP 136/81 | HR 74 | Temp 97.9°F | Ht 66.0 in | Wt 310.0 lb

## 2021-08-23 DIAGNOSIS — Z1331 Encounter for screening for depression: Secondary | ICD-10-CM

## 2021-08-23 DIAGNOSIS — R5383 Other fatigue: Secondary | ICD-10-CM | POA: Diagnosis not present

## 2021-08-23 DIAGNOSIS — E785 Hyperlipidemia, unspecified: Secondary | ICD-10-CM

## 2021-08-23 DIAGNOSIS — E559 Vitamin D deficiency, unspecified: Secondary | ICD-10-CM

## 2021-08-23 DIAGNOSIS — Z9189 Other specified personal risk factors, not elsewhere classified: Secondary | ICD-10-CM

## 2021-08-23 DIAGNOSIS — R0602 Shortness of breath: Secondary | ICD-10-CM

## 2021-08-23 DIAGNOSIS — E1169 Type 2 diabetes mellitus with other specified complication: Secondary | ICD-10-CM | POA: Diagnosis not present

## 2021-08-23 DIAGNOSIS — Z6841 Body Mass Index (BMI) 40.0 and over, adult: Secondary | ICD-10-CM

## 2021-08-24 LAB — LIPID PANEL
Chol/HDL Ratio: 4.2 ratio (ref 0.0–4.4)
Cholesterol, Total: 220 mg/dL — ABNORMAL HIGH (ref 100–199)
HDL: 52 mg/dL (ref >39)
LDL Chol Calc (NIH): 153 mg/dL — ABNORMAL HIGH (ref 0–99)
Triglycerides: 82 mg/dL (ref 0–149)
VLDL Cholesterol Cal: 15 mg/dL (ref 5–40)

## 2021-08-24 LAB — COMPREHENSIVE METABOLIC PANEL
ALT: 14 IU/L (ref 0–32)
AST: 15 IU/L (ref 0–40)
Albumin/Globulin Ratio: 1.3 (ref 1.2–2.2)
Albumin: 4 g/dL (ref 3.8–4.9)
Alkaline Phosphatase: 121 IU/L (ref 44–121)
BUN/Creatinine Ratio: 15 (ref 9–23)
BUN: 11 mg/dL (ref 6–24)
Bilirubin Total: 0.4 mg/dL (ref 0.0–1.2)
CO2: 24 mmol/L (ref 20–29)
Calcium: 9.5 mg/dL (ref 8.7–10.2)
Chloride: 103 mmol/L (ref 96–106)
Creatinine, Ser: 0.75 mg/dL (ref 0.57–1.00)
Globulin, Total: 3 g/dL (ref 1.5–4.5)
Glucose: 96 mg/dL (ref 70–99)
Potassium: 4.4 mmol/L (ref 3.5–5.2)
Sodium: 140 mmol/L (ref 134–144)
Total Protein: 7 g/dL (ref 6.0–8.5)
eGFR: 95 mL/min/{1.73_m2} (ref >59)

## 2021-08-24 LAB — CBC WITH DIFFERENTIAL/PLATELET
Basophils Absolute: 0 10*3/uL (ref 0.0–0.2)
Basos: 1 %
EOS (ABSOLUTE): 0.2 10*3/uL (ref 0.0–0.4)
Eos: 3 %
Hematocrit: 38.6 % (ref 34.0–46.6)
Hemoglobin: 12.3 g/dL (ref 11.1–15.9)
Immature Grans (Abs): 0 10*3/uL (ref 0.0–0.1)
Immature Granulocytes: 0 %
Lymphocytes Absolute: 2.7 10*3/uL (ref 0.7–3.1)
Lymphs: 46 %
MCH: 24.8 pg — ABNORMAL LOW (ref 26.6–33.0)
MCHC: 31.9 g/dL (ref 31.5–35.7)
MCV: 78 fL — ABNORMAL LOW (ref 79–97)
Monocytes Absolute: 0.4 10*3/uL (ref 0.1–0.9)
Monocytes: 7 %
Neutrophils Absolute: 2.5 10*3/uL (ref 1.4–7.0)
Neutrophils: 43 %
Platelets: 227 10*3/uL (ref 150–450)
RBC: 4.96 x10E6/uL (ref 3.77–5.28)
RDW: 14.8 % (ref 11.7–15.4)
WBC: 5.8 10*3/uL (ref 3.4–10.8)

## 2021-08-24 LAB — INSULIN, RANDOM: INSULIN: 27.4 u[IU]/mL — ABNORMAL HIGH (ref 2.6–24.9)

## 2021-08-24 LAB — FOLATE: Folate: 5.6 ng/mL (ref >3.0)

## 2021-08-24 LAB — HEMOGLOBIN A1C
Est. average glucose Bld gHb Est-mCnc: 154 mg/dL
Hgb A1c MFr Bld: 7 % — ABNORMAL HIGH (ref 4.8–5.6)

## 2021-08-24 LAB — T3, FREE: T3, Free: 2.8 pg/mL (ref 2.0–4.4)

## 2021-08-24 LAB — TSH: TSH: 1.44 u[IU]/mL (ref 0.450–4.500)

## 2021-08-24 LAB — VITAMIN D 25 HYDROXY (VIT D DEFICIENCY, FRACTURES): Vit D, 25-Hydroxy: 30.5 ng/mL (ref 30.0–100.0)

## 2021-08-24 LAB — T4, FREE: Free T4: 1.25 ng/dL (ref 0.82–1.77)

## 2021-08-24 LAB — VITAMIN B12: Vitamin B-12: 793 pg/mL (ref 232–1245)

## 2021-08-24 NOTE — Progress Notes (Signed)
Chief Complaint:   OBESITY Tanya Houston (MR# [id number]) is a 56 y.o. female who presents for evaluation and treatment of obesity and related comorbidities. Current BMI is Body mass index is 50.04 kg/m. Tanya Houston has been struggling with her weight for many years and has been unsuccessful in either losing weight, maintaining weight loss, or reaching her healthy weight goal.  Tanya Houston is currently in the action stage of change and ready to dedicate time achieving and maintaining a healthier weight. Tanya Houston is interested in becoming our patient and working on intensive lifestyle modifications including (but not limited to) diet and exercise for weight loss.  [name]'s habits were reviewed today and are as follows: Her family eats meals together, her desired weight loss is 140 lbs, she started gaining weight 3 months after her mother died, her heaviest weight ever was 320 pounds, she has significant food cravings issues, she snacks frequently in the evenings, she is frequently drinking liquids with calories, she frequently makes poor food choices, she has problems with excessive hunger, she frequently eats larger portions than normal, and she struggles with emotional eating.  Depression Screen Tanya Houston's Food and Mood (modified PHQ-9) score was 7.  Depression screen PHQ 2/9 08/23/2021  Decreased Interest 1  Down, Depressed, Hopeless 0  PHQ - 2 Score 1  Altered sleeping 0  Tired, decreased energy 3  Change in appetite 2  Feeling bad or failure about yourself  0  Trouble concentrating 0  Moving slowly or fidgety/restless 1  Suicidal thoughts 0  PHQ-9 Score 7  Difficult doing work/chores Not difficult at all   Subjective:   1. Other fatigue [name] admits to daytime somnolence and admits to waking up still tired. Patent has a history of symptoms of daytime fatigue and morning fatigue. Tanya Houston generally gets 5 or 6 hours of sleep per night, and states that she has nightime  awakenings. Snoring is present. Apneic episodes are not present. Epworth Sleepiness Score is 14.  2. SOB (shortness of breath) on exertion Tanya Houston notes increasing shortness of breath with exercising and seems to be worsening over time with weight gain. She notes getting out of breath sooner with activity than she used to. This has not gotten worse recently. Tanya Houston denies shortness of breath at rest or orthopnea.  3. Type 2 diabetes mellitus with other specified complication, without long-term current use of insulin ([place]) Tanya Houston is on Victoza, and she is working on diet, exercise, and weight loss. She was changed to [facility] 2 by her primary care physician, but her insurance wouldn't cover it.  4. Hyperlipidemia associated with type 2 diabetes mellitus ([place]) Shanay is attempting to improve with diet, and she is not on statin.  5. Vitamin D deficiency Tanya Houston has a history of Vitamin D, and she is not on Vit D currently.   6. At risk for heart disease Tanya Houston is at a higher than average risk for cardiovascular disease due to obesity.   Assessment/Plan:   1. Other fatigue Tanya Houston does feel that her weight is causing her energy to be lower than it should be. Fatigue may be related to obesity, depression or many other causes. Labs will be ordered, and in the meanwhile, [name] will focus on self care including making healthy food choices, increasing physical activity and focusing on stress reduction.  - CBC with Differential/Platelet - EKG 12-Lead - Folate - T3, free - T4, free - TSH - Vitamin B12  2. SOB (shortness of breath) on exertion Tanya Houston does feel  that she gets out of breath more easily that she used to when she exercises. Tanya Houston's shortness of breath appears to be obesity related and exercise induced. She has agreed to work on weight loss and gradually increase exercise to treat her exercise induced shortness of breath. Will continue to monitor closely.  3. Type 2  diabetes mellitus with other specified complication, without long-term current use of insulin (HCC) We will check labs today. Tanya Houston will start her Category 3 plan. Good blood sugar control is important to decrease the likelihood of diabetic complications such as nephropathy, neuropathy, limb loss, blindness, coronary artery disease, and death. Intensive lifestyle modification including diet, exercise and weight loss are the first line of treatment for diabetes.   - Comprehensive metabolic panel - Hemoglobin A1c - Insulin, random  4. Hyperlipidemia associated with type 2 diabetes mellitus ([name]) Cardiovascular risk and specific lipid/LDL goals reviewed. We discussed several lifestyle modifications today. We will check labs today. Tanya Houston will start her Category 3 plan. Orders and follow up as documented in patient record.   - Lipid panel  5. Vitamin D deficiency Low Vitamin D level contributes to fatigue and are associated with obesity, breast, and colon cancer. We will check labs today, and Tanya Houston will follow-up for routine testing of Vitamin D, at least 2-3 times per year to avoid over-replacement.  - VITAMIN D 25 Hydroxy (Vit-D Deficiency, Fractures)  6. Screening for depression Tanya Houston had a positive depression screening. Depression is commonly associated with obesity and often results in emotional eating behaviors. We will monitor this closely and work on CBT to help improve the non-hunger eating patterns. Referral to Psychology may be required if no improvement is seen as she continues in our clinic.  7. At risk for heart disease Tanya Houston was given approximately 30 minutes of coronary artery disease prevention counseling today. She is 56 y.o. female and has risk factors for heart disease including obesity. We discussed intensive lifestyle modifications today with an emphasis on specific weight loss instructions and strategies.   Repetitive spaced learning was employed today to  elicit superior memory formation and behavioral change.  8.  Obesity with current BMI of 50.1 Tanya Houston is currently in the action stage of change and her goal is to continue with weight loss efforts. I recommend Tanya Houston begin the structured treatment plan as follows:  She has agreed to the Category 3 Plan.  Exercise goals: No exercise has been prescribed for now, while we concentrate on nutritional changes.  Behavioral modification strategies: increasing lean protein intake and no skipping meals.  She was informed of the importance of frequent follow-up visits to maximize her success with intensive lifestyle modifications for her multiple health conditions. She was informed we would discuss her lab results at her next visit unless there is a critical issue that needs to be addressed sooner. Haani agreed to keep her next visit at the agreed upon time to discuss these results.  Objective:   Blood pressure 136/81, pulse 74, temperature 97.9 F (36.6 C), temperature source Oral, height 5\' 6"  (1.676 m), weight (!) 310 lb (140.6 kg), SpO2 97 %. Body mass index is 50.04 kg/m.  EKG: Normal sinus rhythm, rate 78 BPM.  Indirect Calorimeter completed today shows a VO2 of 221 and a REE of 1526.  Her calculated basal metabolic rate is 8099 thus her basal metabolic rate is worse than expected.  General: Cooperative, alert, well developed, in no acute distress. HEENT: Conjunctivae and lids unremarkable. Cardiovascular: Regular rhythm.  Lungs:  Normal work of breathing. Neurologic: No focal deficits.   Lab Results  Component Value Date   CREATININE 0.75 08/23/2021   BUN 11 08/23/2021   NA 140 08/23/2021   K 4.4 08/23/2021   CL 103 08/23/2021   CO2 24 08/23/2021   Lab Results  Component Value Date   ALT 14 08/23/2021   AST 15 08/23/2021   ALKPHOS 121 08/23/2021   BILITOT 0.4 08/23/2021   Lab Results  Component Value Date   HGBA1C 7.0 (H) 08/23/2021   HGBA1C 6.1 (A) 03/09/2020    HGBA1C 6.4 (A) 12/09/2019   HGBA1C 7.1 (H) 04/24/2019   HGBA1C 8.2 (H) 01/08/2019   Lab Results  Component Value Date   INSULIN 27.4 (H) 08/23/2021   Lab Results  Component Value Date   TSH 1.440 08/23/2021   Lab Results  Component Value Date   CHOL 220 (H) 08/23/2021   HDL 52 08/23/2021   LDLCALC 153 (H) 08/23/2021   TRIG 82 08/23/2021   CHOLHDL 4.2 08/23/2021   Lab Results  Component Value Date   WBC 5.8 08/23/2021   HGB 12.3 08/23/2021   HCT 38.6 08/23/2021   MCV 78 (L) 08/23/2021   PLT 227 08/23/2021   Lab Results  Component Value Date   FERRITIN 44.7 01/08/2019   Attestation Statements:   Reviewed by clinician on day of visit: allergies, medications, problem list, medical history, surgical history, family history, social history, and previous encounter notes.   I, Trixie Dredge, am acting as transcriptionist for Dennard Nip, MD.  I have reviewed the above documentation for accuracy and completeness, and I agree with the above. - Dennard Nip, MD

## 2021-09-06 ENCOUNTER — Encounter (INDEPENDENT_AMBULATORY_CARE_PROVIDER_SITE_OTHER): Payer: Self-pay | Admitting: Family Medicine

## 2021-09-06 ENCOUNTER — Ambulatory Visit (INDEPENDENT_AMBULATORY_CARE_PROVIDER_SITE_OTHER): Payer: 59 | Admitting: Family Medicine

## 2021-09-06 ENCOUNTER — Other Ambulatory Visit: Payer: Self-pay

## 2021-09-06 VITALS — BP 125/75 | HR 84 | Temp 98.6°F | Ht 66.0 in

## 2021-09-06 DIAGNOSIS — E1169 Type 2 diabetes mellitus with other specified complication: Secondary | ICD-10-CM

## 2021-09-06 DIAGNOSIS — E785 Hyperlipidemia, unspecified: Secondary | ICD-10-CM | POA: Diagnosis not present

## 2021-09-06 DIAGNOSIS — E1165 Type 2 diabetes mellitus with hyperglycemia: Secondary | ICD-10-CM

## 2021-09-06 DIAGNOSIS — E559 Vitamin D deficiency, unspecified: Secondary | ICD-10-CM | POA: Diagnosis not present

## 2021-09-06 DIAGNOSIS — R718 Other abnormality of red blood cells: Secondary | ICD-10-CM

## 2021-09-06 DIAGNOSIS — E669 Obesity, unspecified: Secondary | ICD-10-CM

## 2021-09-06 DIAGNOSIS — Z6841 Body Mass Index (BMI) 40.0 and over, adult: Secondary | ICD-10-CM

## 2021-09-06 MED ORDER — OZEMPIC (0.25 OR 0.5 MG/DOSE) 2 MG/1.5ML ~~LOC~~ SOPN: 0.2500 mg | PEN_INJECTOR | SUBCUTANEOUS | 0 refills | Status: DC

## 2021-09-06 NOTE — Progress Notes (Signed)
Chief Complaint:   OBESITY Tanya Houston is here to discuss her progress with her obesity treatment plan along with follow-up of her obesity related diagnoses. Tanya Houston is on the Category 3 Plan and states she is following her eating plan approximately 99% of the time. Tanya Houston states she is not currently exercising.  Today's visit was #: 2 Starting weight: 310 lbs Starting date: 08/23/2021 Today's weight: 304 lbs Today's date: 09/06/2021 Total lbs lost to date: 6 Total lbs lost since last in-office visit: 6  Interim History: Pt has to go to court today- significant turmoil with grandson. She felt the first 2 weeks was easy to stay on plan. She wanted to occasionally snack when watching television in the evening. She is doing jello, mixed nuts, or animal crackers and weight watchers fudge pop. Pt wants to take away string cheese. She has struggled to get all meat in. Pt is wondering about fruit options.  Subjective:   1. Hyperlipidemia associated with type 2 diabetes mellitus ([place]) Discussed labs with patient today. Pt has an LDL of 153, HDL 52, and triglycerides 82 and is not on statin therapy.  2. Type 2 diabetes mellitus with hyperglycemia, without long-term current use of insulin (HCC) Pt's last A1c was 7.0 with an insulin level of 27.4. She is on Victoza 1.8 mg.  3. Vitamin D deficiency Pt is not on Vit D. Her Vit D level is 30.5.  4. Microcytosis Pt has at least 7 years of MCV <80. She has no h/o thalassemia trait or sickle cell trait. Her last anemia panel was within normal limits.  Assessment/Plan:   1. Hyperlipidemia associated with type 2 diabetes mellitus (Tanya Houston) Cardiovascular risk and specific lipid/LDL goals reviewed.  We discussed several lifestyle modifications today and Tanya Houston will continue to work on diet, exercise and weight loss efforts. Orders and follow up as documented in patient record. Pt is to contemplate statin.  Counseling Intensive lifestyle  modifications are the first line treatment for this issue. Dietary changes: Increase soluble fiber. Decrease simple carbohydrates. Exercise changes: Moderate to vigorous-intensity aerobic activity 150 minutes per week if tolerated. Lipid-lowering medications: see documented in medical record.  2. Type 2 diabetes mellitus with hyperglycemia, without long-term current use of insulin (HCC) Good blood sugar control is important to decrease the likelihood of diabetic complications such as nephropathy, neuropathy, limb loss, blindness, coronary artery disease, and death. Intensive lifestyle modification including diet, exercise and weight loss are the first line of treatment for diabetes. Pt will start Tanya Houston. Discontinue Victoza.  Start- Tanya Houston,0.25 or 0.5MG /DOS, (Tanya Houston, 0.25 OR 0.5 MG/DOSE,) 2 MG/1.5ML SOPN; Inject 0.25 mg into the skin once a week.  Dispense: 1.5 mL; Refill: 0  3. Vitamin D deficiency Low Vitamin D level contributes to fatigue and are associated with obesity, breast, and colon cancer. She agrees to restart prescription Vitamin D 50,000 IU every week and will follow-up for routine testing of Vitamin D, at least 2-3 times per year to avoid over-replacement. Pt has Rx.  4. Microcytosis Repeat CBC in 4 months.  5. Obesity with current BMI of 49.2  Tanya Houston is currently in the action stage of change. As such, her goal is to continue with weight loss efforts. She has agreed to the Category 3 Plan.   Exercise goals: No exercise has been prescribed at this time.  Behavioral modification strategies: increasing lean protein intake, meal planning and cooking strategies, keeping healthy foods in the home, and planning for success.  Tanya Houston has agreed to  follow-up with our clinic in 2-3 weeks. She was informed of the importance of frequent follow-up visits to maximize her success with intensive lifestyle modifications for her multiple health conditions.   Objective:   Blood  pressure 125/75, pulse 84, temperature 98.6 F (37 C), height 5\' 6"  (1.676 m), SpO2 96 %. Body mass index is 50.04 kg/m.  General: Cooperative, alert, well developed, in no acute distress. HEENT: Conjunctivae and lids unremarkable. Cardiovascular: Regular rhythm.  Lungs: Normal work of breathing. Neurologic: No focal deficits.   Lab Results  Component Value Date   CREATININE 0.75 08/23/2021   BUN 11 08/23/2021   NA 140 08/23/2021   K 4.4 08/23/2021   CL 103 08/23/2021   CO2 24 08/23/2021   Lab Results  Component Value Date   ALT 14 08/23/2021   AST 15 08/23/2021   ALKPHOS 121 08/23/2021   BILITOT 0.4 08/23/2021   Lab Results  Component Value Date   HGBA1C 7.0 (H) 08/23/2021   HGBA1C 6.1 (A) 03/09/2020   HGBA1C 6.4 (A) 12/09/2019   HGBA1C 7.1 (H) 04/24/2019   HGBA1C 8.2 (H) 01/08/2019   Lab Results  Component Value Date   INSULIN 27.4 (H) 08/23/2021   Lab Results  Component Value Date   TSH 1.440 08/23/2021   Lab Results  Component Value Date   CHOL 220 (H) 08/23/2021   HDL 52 08/23/2021   LDLCALC 153 (H) 08/23/2021   TRIG 82 08/23/2021   CHOLHDL 4.2 08/23/2021   Lab Results  Component Value Date   VD25OH 30.5 08/23/2021   VD25OH 8.52 (L) 01/08/2019   Lab Results  Component Value Date   WBC 5.8 08/23/2021   HGB 12.3 08/23/2021   HCT 38.6 08/23/2021   MCV 78 (L) 08/23/2021   PLT 227 08/23/2021   Lab Results  Component Value Date   FERRITIN 44.7 01/08/2019     Attestation Statements:   Reviewed by clinician on day of visit: allergies, medications, problem list, medical history, surgical history, family history, social history, and previous encounter notes.  Coral Ceo, CMA, am acting as transcriptionist for Coralie Common, MD.   I have reviewed the above documentation for accuracy and completeness, and I agree with the above. - Coralie Common, MD

## 2021-09-07 ENCOUNTER — Encounter (INDEPENDENT_AMBULATORY_CARE_PROVIDER_SITE_OTHER): Payer: Self-pay

## 2021-09-14 ENCOUNTER — Telehealth (INDEPENDENT_AMBULATORY_CARE_PROVIDER_SITE_OTHER): Payer: Self-pay | Admitting: Family Medicine

## 2021-09-14 NOTE — Telephone Encounter (Signed)
Patient is having diarrhea and would like to know if it could be something she's eating off plan. Patient says she can not get into her My chart.

## 2021-09-14 NOTE — Telephone Encounter (Signed)
Please advise 

## 2021-09-18 NOTE — Telephone Encounter (Signed)
Called patient again to follow up on GI symptoms.  Went to urgent care and got antibiotics for diverticulitis and steroids for hemorrhoids.  She was eating very bland and soft foods. Has started back on the meal plan today.

## 2021-09-25 ENCOUNTER — Encounter (INDEPENDENT_AMBULATORY_CARE_PROVIDER_SITE_OTHER): Payer: Self-pay | Admitting: Family Medicine

## 2021-09-25 ENCOUNTER — Ambulatory Visit (INDEPENDENT_AMBULATORY_CARE_PROVIDER_SITE_OTHER): Payer: 59 | Admitting: Family Medicine

## 2021-09-25 ENCOUNTER — Other Ambulatory Visit: Payer: Self-pay

## 2021-09-25 VITALS — BP 132/87 | HR 77 | Temp 98.0°F | Ht 66.0 in | Wt 303.0 lb

## 2021-09-25 DIAGNOSIS — E559 Vitamin D deficiency, unspecified: Secondary | ICD-10-CM

## 2021-09-25 DIAGNOSIS — Z9189 Other specified personal risk factors, not elsewhere classified: Secondary | ICD-10-CM

## 2021-09-25 DIAGNOSIS — Z6841 Body Mass Index (BMI) 40.0 and over, adult: Secondary | ICD-10-CM

## 2021-09-25 DIAGNOSIS — E1165 Type 2 diabetes mellitus with hyperglycemia: Secondary | ICD-10-CM

## 2021-09-25 DIAGNOSIS — E669 Obesity, unspecified: Secondary | ICD-10-CM | POA: Diagnosis not present

## 2021-09-25 DIAGNOSIS — Z7985 Long-term (current) use of injectable non-insulin antidiabetic drugs: Secondary | ICD-10-CM

## 2021-09-25 MED ORDER — OZEMPIC (0.25 OR 0.5 MG/DOSE) 2 MG/1.5ML ~~LOC~~ SOPN: 0.2500 mg | PEN_INJECTOR | SUBCUTANEOUS | 0 refills | Status: DC

## 2021-09-26 MED ORDER — VITAMIN D (ERGOCALCIFEROL) 1.25 MG (50000 UNIT) PO CAPS: 50000.0000 [IU] | ORAL_CAPSULE | ORAL | 0 refills | Status: DC

## 2021-09-26 NOTE — Progress Notes (Signed)
Chief Complaint:   OBESITY Tanya Houston is here to discuss her progress with her obesity treatment plan along with follow-up of her obesity related diagnoses. Tanya Houston is on the Category 3 Plan and states she is following her eating plan approximately 100% of the time. Tanya Houston states she is doing 0 minutes 0 times per week.  Today's visit was #: 3 Starting weight: 310 lbs Starting date: 08/23/2021 Today's weight: 303 lbs Today's date: 09/25/2021 Total lbs lost to date: 7 Total lbs lost since last in-office visit: 1  Interim History: Tanya Houston had an episode of diverticulitis. She is getting back to her eating plan but she is struggling to eat all of her dinner protein.  Subjective:   1. Type 2 diabetes mellitus with hyperglycemia, without long-term current use of insulin ([place]) Tanya Houston started Ozempic at her last visit. She is doing well with no nausea or vomiting.  2. Vitamin D deficiency Tanya Houston is on Vitamin D, but her level is not yet at goal.  3. At risk for heart disease Tanya Houston is at higher than average risk for cardiovascular disease due to obesity.  Assessment/Plan:   1. Type 2 diabetes mellitus with hyperglycemia, without long-term current use of insulin (HCC) Tanya Houston will continue Ozempic and we will refill for 1 month. Good blood sugar control is important to decrease the likelihood of diabetic complications such as nephropathy, neuropathy, limb loss, blindness, coronary artery disease, and death. Intensive lifestyle modification including diet, exercise and weight loss are the first line of treatment for diabetes.   - Semaglutide,0.25 or 0.5MG /DOS, (OZEMPIC, 0.25 OR 0.5 MG/DOSE,) 2 MG/1.5ML SOPN; Inject 0.25 mg into the skin once a week.  Dispense: 1.5 mL; Refill: 0  2. Vitamin D deficiency Low Vitamin D level contributes to fatigue and are associated with obesity, breast, and colon cancer. Tanya Houston agreed to restart prescription Vitamin D 50,000 IU every week and we  will refill for 1 month. She will follow-up for routine testing of Vitamin D, at least 2-3 times per year to avoid over-replacement.  - Vitamin D, Ergocalciferol, (DRISDOL) 1.25 MG (50000 UNIT) CAPS capsule; Take 1 capsule (50,000 Units total) by mouth every 7 (seven) days.  Dispense: 4 capsule; Refill: 0  3. At risk for heart disease Tanya Houston was given approximately 15 minutes of coronary artery disease prevention counseling today. She is 56 y.o. female and has risk factors for heart disease including obesity. We discussed intensive lifestyle modifications today with an emphasis on specific weight loss instructions and strategies.  Repetitive spaced learning was employed today to elicit superior memory formation and behavioral change.   4. Obesity with current BMI of 49.0 Tanya Houston is currently in the action stage of change. As such, her goal is to continue with weight loss efforts. She has agreed to the Category 3 Plan.   Lean meat equivalents were discussed.  Behavioral modification strategies: increasing lean protein intake and meal planning and cooking strategies.  Tanya Houston has agreed to follow-up with our clinic in 2 to 3 weeks. She was informed of the importance of frequent follow-up visits to maximize her success with intensive lifestyle modifications for her multiple health conditions.   Objective:   Blood pressure 132/87, pulse 77, temperature 98 F (36.7 C), height 5\' 6"  (1.676 m), weight (!) 303 lb (137.4 kg), SpO2 97 %. Body mass index is 48.91 kg/m.  General: Cooperative, alert, well developed, in no acute distress. HEENT: Conjunctivae and lids unremarkable. Cardiovascular: Regular rhythm.  Lungs: Normal work of breathing.  Neurologic: No focal deficits.   Lab Results  Component Value Date   CREATININE 0.75 08/23/2021   BUN 11 08/23/2021   NA 140 08/23/2021   K 4.4 08/23/2021   CL 103 08/23/2021   CO2 24 08/23/2021   Lab Results  Component Value Date   ALT 14  08/23/2021   AST 15 08/23/2021   ALKPHOS 121 08/23/2021   BILITOT 0.4 08/23/2021   Lab Results  Component Value Date   HGBA1C 7.0 (H) 08/23/2021   HGBA1C 6.1 (A) 03/09/2020   HGBA1C 6.4 (A) 12/09/2019   HGBA1C 7.1 (H) 04/24/2019   HGBA1C 8.2 (H) 01/08/2019   Lab Results  Component Value Date   INSULIN 27.4 (H) 08/23/2021   Lab Results  Component Value Date   TSH 1.440 08/23/2021   Lab Results  Component Value Date   CHOL 220 (H) 08/23/2021   HDL 52 08/23/2021   LDLCALC 153 (H) 08/23/2021   TRIG 82 08/23/2021   CHOLHDL 4.2 08/23/2021   Lab Results  Component Value Date   VD25OH 30.5 08/23/2021   VD25OH 8.52 (L) 01/08/2019   Lab Results  Component Value Date   WBC 5.8 08/23/2021   HGB 12.3 08/23/2021   HCT 38.6 08/23/2021   MCV 78 (L) 08/23/2021   PLT 227 08/23/2021   Lab Results  Component Value Date   FERRITIN 44.7 01/08/2019   Attestation Statements:   Reviewed by clinician on day of visit: allergies, medications, problem list, medical history, surgical history, family history, social history, and previous encounter notes.   I, Trixie Dredge, am acting as transcriptionist for Dennard Nip, MD.  I have reviewed the above documentation for accuracy and completeness, and I agree with the above. -  Dennard Nip, MD

## 2021-10-16 ENCOUNTER — Ambulatory Visit (INDEPENDENT_AMBULATORY_CARE_PROVIDER_SITE_OTHER): Payer: 59 | Admitting: Family Medicine

## 2021-10-16 ENCOUNTER — Encounter (INDEPENDENT_AMBULATORY_CARE_PROVIDER_SITE_OTHER): Payer: Self-pay | Admitting: Family Medicine

## 2021-10-16 ENCOUNTER — Other Ambulatory Visit: Payer: Self-pay

## 2021-10-16 VITALS — BP 114/72 | HR 79 | Temp 98.5°F | Ht 66.0 in | Wt 301.0 lb

## 2021-10-16 DIAGNOSIS — E1165 Type 2 diabetes mellitus with hyperglycemia: Secondary | ICD-10-CM

## 2021-10-16 DIAGNOSIS — E669 Obesity, unspecified: Secondary | ICD-10-CM | POA: Diagnosis not present

## 2021-10-16 DIAGNOSIS — J302 Other seasonal allergic rhinitis: Secondary | ICD-10-CM | POA: Diagnosis not present

## 2021-10-16 DIAGNOSIS — Z6841 Body Mass Index (BMI) 40.0 and over, adult: Secondary | ICD-10-CM

## 2021-10-16 MED ORDER — OZEMPIC (0.25 OR 0.5 MG/DOSE) 2 MG/1.5ML ~~LOC~~ SOPN: 0.5000 mg | PEN_INJECTOR | SUBCUTANEOUS | 0 refills | Status: DC

## 2021-10-16 NOTE — Progress Notes (Signed)
Chief Complaint:   OBESITY Tanya Houston is here to discuss her progress with her obesity treatment plan along with follow-up of her obesity related diagnoses. Tanya Houston is on the Category 3 Plan and states she is following her eating plan approximately 95% of the time. Tanya Houston states she is not currently exercising.  Today's visit was #: 4 Starting weight: 310 lbs Starting date: 08/23/2021 Today's weight: 301 lbs Today's date: 10/16/2021 Total lbs lost to date: 9 Total lbs lost since last in-office visit: 2  Interim History: Pt feels she didn't follow the plan as strictly as she could have. She had Super Bowl Sunday and a few pizza nights on Fridays. She feels pizza is her weakness. Pt can control indulgences most of the time. She has no plans for the next few weeks. Pt recognizes that pizza is definitely an indulgence she will continue to have in her life.  Subjective:   1. Type 2 diabetes mellitus with hyperglycemia, without long-term current use of insulin (HCC) Pt has no noticeable effects on Ozempic. Her last A1c was 7.0. She is wondering if she should be losing more with Ozempic.  2. Seasonal allergies Pt is on Flonase and Zyrtec. She reports significant sxs of allergies currently.  Assessment/Plan:   1. Type 2 diabetes mellitus with hyperglycemia, without long-term current use of insulin (HCC) Good blood sugar control is important to decrease the likelihood of diabetic complications such as nephropathy, neuropathy, limb loss, blindness, coronary artery disease, and death. Intensive lifestyle modification including diet, exercise and weight loss are the first line of treatment for diabetes. Pt will increase Ozempic to 0.5 mg weekly as prescribed.   Increase & Refill- Semaglutide,0.25 or 0.5MG /DOS, (OZEMPIC, 0.25 OR 0.5 MG/DOSE,) 2 MG/1.5ML SOPN; Inject 0.5 mg into the skin once a week.  Dispense: 1.5 mL; Refill: 0  2. Seasonal allergies Continue Flonase and Zyrtec. Handout on  Outpatient Pharmacy given.  3. Obesity with current BMI of 48.7 Tanya Houston is currently in the action stage of change. As such, her goal is to continue with weight loss efforts. She has agreed to the Category 3 Plan.   Exercise goals: No exercise has been prescribed at this time.  Behavioral modification strategies: increasing lean protein intake, meal planning and cooking strategies, keeping healthy foods in the home, and planning for success.  Tanya Houston has agreed to follow-up with our clinic in 2-3 weeks. She was informed of the importance of frequent follow-up visits to maximize her success with intensive lifestyle modifications for her multiple health conditions.   Objective:   Blood pressure 114/72, pulse 79, temperature 98.5 F (36.9 C), height 5\' 6"  (1.676 m), weight (!) 301 lb (136.5 kg), SpO2 97 %. Body mass index is 48.58 kg/m.  General: Cooperative, alert, well developed, in no acute distress. HEENT: Conjunctivae and lids unremarkable. Cardiovascular: Regular rhythm.  Lungs: Normal work of breathing. Neurologic: No focal deficits.   Lab Results  Component Value Date   CREATININE 0.75 08/23/2021   BUN 11 08/23/2021   NA 140 08/23/2021   K 4.4 08/23/2021   CL 103 08/23/2021   CO2 24 08/23/2021   Lab Results  Component Value Date   ALT 14 08/23/2021   AST 15 08/23/2021   ALKPHOS 121 08/23/2021   BILITOT 0.4 08/23/2021   Lab Results  Component Value Date   HGBA1C 7.0 (H) 08/23/2021   HGBA1C 6.1 (A) 03/09/2020   HGBA1C 6.4 (A) 12/09/2019   HGBA1C 7.1 (H) 04/24/2019   HGBA1C 8.2 (  H) 01/08/2019   Lab Results  Component Value Date   INSULIN 27.4 (H) 08/23/2021   Lab Results  Component Value Date   TSH 1.440 08/23/2021   Lab Results  Component Value Date   CHOL 220 (H) 08/23/2021   HDL 52 08/23/2021   LDLCALC 153 (H) 08/23/2021   TRIG 82 08/23/2021   CHOLHDL 4.2 08/23/2021   Lab Results  Component Value Date   VD25OH 30.5 08/23/2021   VD25OH 8.52 (L)  01/08/2019   Lab Results  Component Value Date   WBC 5.8 08/23/2021   HGB 12.3 08/23/2021   HCT 38.6 08/23/2021   MCV 78 (L) 08/23/2021   PLT 227 08/23/2021   Lab Results  Component Value Date   FERRITIN 44.7 01/08/2019    Attestation Statements:   Reviewed by clinician on day of visit: allergies, medications, problem list, medical history, surgical history, family history, social history, and previous encounter notes.  Coral Ceo, CMA, am acting as transcriptionist for Coralie Common, MD.  I have reviewed the above documentation for accuracy and completeness, and I agree with the above. - Coralie Common, MD

## 2021-10-31 ENCOUNTER — Other Ambulatory Visit: Payer: Self-pay

## 2021-10-31 ENCOUNTER — Ambulatory Visit (INDEPENDENT_AMBULATORY_CARE_PROVIDER_SITE_OTHER): Payer: 59 | Admitting: Family Medicine

## 2021-10-31 ENCOUNTER — Encounter (INDEPENDENT_AMBULATORY_CARE_PROVIDER_SITE_OTHER): Payer: Self-pay | Admitting: Family Medicine

## 2021-10-31 VITALS — BP 111/74 | HR 76 | Temp 97.9°F | Ht 66.0 in | Wt 297.0 lb

## 2021-10-31 DIAGNOSIS — E1165 Type 2 diabetes mellitus with hyperglycemia: Secondary | ICD-10-CM | POA: Diagnosis not present

## 2021-10-31 DIAGNOSIS — Z6841 Body Mass Index (BMI) 40.0 and over, adult: Secondary | ICD-10-CM | POA: Diagnosis not present

## 2021-10-31 DIAGNOSIS — E559 Vitamin D deficiency, unspecified: Secondary | ICD-10-CM | POA: Diagnosis not present

## 2021-10-31 DIAGNOSIS — E669 Obesity, unspecified: Secondary | ICD-10-CM

## 2021-10-31 DIAGNOSIS — Z9189 Other specified personal risk factors, not elsewhere classified: Secondary | ICD-10-CM

## 2021-11-06 NOTE — Progress Notes (Signed)
? ? ? ?Chief Complaint:  ? ?OBESITY ?Tanya Houston is here to discuss her progress with her obesity treatment plan along with follow-up of her obesity related diagnoses. Tanya Houston is on the Category 3 Plan and states she is following her eating plan approximately 100% of the time. Tanya Houston states she is walking for 30 minutes 3 times per week. ? ?Today's visit was #: 5 ?Starting weight: 310 lbs ?Starting date: 08/23/2021 ?Today's weight: 297 lbs ?Today's date: 10/31/2021 ?Total lbs lost to date: 62 ?Total lbs lost since last in-office visit: 4 ? ?Interim History: Tanya Houston has done well with weight loss. She is meeting her protein goals and she is drinking water daily. Her highest weight was 321 lbs. ? ?Subjective:  ? ?1. Type 2 diabetes mellitus with hyperglycemia, without long-term current use of insulin (Tanya Houston) ?Tanya Houston's last A1c was 7.0. She was started on Ozempic 0.5 mg this week, and she notes some side effects of nausea.  ? ?2. Vitamin D deficiency ?Tanya Houston is currently not taking Vit D. She notes cramping with her hands that worsens when she takes Vit D. ? ?3. At risk for osteoporosis ?Tanya Houston is at higher risk of osteopenia and osteoporosis due to Vitamin D deficiency.  ? ?Assessment/Plan:  ? ?1. Type 2 diabetes mellitus with hyperglycemia, without long-term current use of insulin ([name]) ?Tanya Houston will continue Ozempic 0.5 mg. Side effects were discussed. Good blood sugar control is important to decrease the likelihood of diabetic complications such as nephropathy, neuropathy, limb loss, blindness, coronary artery disease, and death. Intensive lifestyle modification including diet, exercise and weight loss are the first line of treatment for diabetes.  ? ?2. Vitamin D deficiency ?Tanya Houston agreed to start multivitamins OTC, and discontinue prescription Vitamin D 50,000 IU every week due to side effects. She will follow-up for routine testing of Vitamin D, at least 2-3 times per year to avoid over-replacement. ? ?3.  At risk for osteoporosis ?Tanya Houston was given approximately 15 minutes of osteoporosis prevention counseling today. Tanya Houston is at risk for osteopenia and osteoporosis due to her Vitamin D deficiency. She was encouraged to take her Vit D and follow her higher calcium diet and increase strengthening exercise to help strengthen her bones and decrease her risk of osteopenia and osteoporosis. ? ?4. Obesity with current BMI of 47.9 ?Tanya Houston is currently in the action stage of change. As such, her goal is to continue with weight loss efforts. She has agreed to the Category 3 Plan.  ? ?Exercise goals: As is. ? ?Behavioral modification strategies: increasing lean protein intake, increasing water intake, no skipping meals, and meal planning and cooking strategies. ? ?Tanya Houston has agreed to follow-up with our clinic in 2 weeks. She was informed of the importance of frequent follow-up visits to maximize her success with intensive lifestyle modifications for her multiple health conditions.  ? ?Objective:  ? ?Blood pressure 111/74, pulse 76, temperature 97.9 ?F (36.6 ?C), height '5\' 6"'$  (1.676 m), weight 297 lb (134.7 kg), SpO2 98 %. ?Body mass index is 47.94 kg/m?. ? ?General: Cooperative, alert, well developed, in no acute distress. ?HEENT: Conjunctivae and lids unremarkable. ?Cardiovascular: Regular rhythm.  ?Lungs: Normal work of breathing. ?Neurologic: No focal deficits.  ? ?Lab Results  ?Component Value Date  ? CREATININE 0.75 08/23/2021  ? BUN 11 08/23/2021  ? NA 140 08/23/2021  ? K 4.4 08/23/2021  ? CL 103 08/23/2021  ? CO2 24 08/23/2021  ? ?Lab Results  ?Component Value Date  ? ALT 14 08/23/2021  ? AST 15 08/23/2021  ?  ALKPHOS 121 08/23/2021  ? BILITOT 0.4 08/23/2021  ? ?Lab Results  ?Component Value Date  ? HGBA1C 7.0 (H) 08/23/2021  ? HGBA1C 6.1 (A) 03/09/2020  ? HGBA1C 6.4 (A) 12/09/2019  ? HGBA1C 7.1 (H) 04/24/2019  ? HGBA1C 8.2 (H) 01/08/2019  ? ?Lab Results  ?Component Value Date  ? INSULIN 27.4 (H) 08/23/2021   ? ?Lab Results  ?Component Value Date  ? TSH 1.440 08/23/2021  ? ?Lab Results  ?Component Value Date  ? CHOL 220 (H) 08/23/2021  ? HDL 52 08/23/2021  ? LDLCALC 153 (H) 08/23/2021  ? TRIG 82 08/23/2021  ? CHOLHDL 4.2 08/23/2021  ? ?Lab Results  ?Component Value Date  ? VD25OH 30.5 08/23/2021  ? VD25OH 8.52 (L) 01/08/2019  ? ?Lab Results  ?Component Value Date  ? WBC 5.8 08/23/2021  ? HGB 12.3 08/23/2021  ? HCT 38.6 08/23/2021  ? MCV 78 (L) 08/23/2021  ? PLT 227 08/23/2021  ? ?Lab Results  ?Component Value Date  ? FERRITIN 44.7 01/08/2019  ? ?Attestation Statements:  ? ?Reviewed by clinician on day of visit: allergies, medications, problem list, medical history, surgical history, family history, social history, and previous encounter notes. ? ? ?I, Trixie Dredge, am acting as transcriptionist for Dennard Nip, MD. ? ?I have reviewed the above documentation for accuracy and completeness, and I agree with the above. -  Dennard Nip, MD ? ? ?

## 2021-11-14 ENCOUNTER — Ambulatory Visit (INDEPENDENT_AMBULATORY_CARE_PROVIDER_SITE_OTHER): Payer: 59 | Admitting: Family Medicine

## 2021-11-14 ENCOUNTER — Encounter (INDEPENDENT_AMBULATORY_CARE_PROVIDER_SITE_OTHER): Payer: Self-pay | Admitting: Family Medicine

## 2021-11-14 ENCOUNTER — Other Ambulatory Visit: Payer: Self-pay

## 2021-11-14 VITALS — BP 122/75 | HR 76 | Temp 97.6°F | Ht 66.0 in | Wt 298.0 lb

## 2021-11-14 DIAGNOSIS — E669 Obesity, unspecified: Secondary | ICD-10-CM

## 2021-11-14 DIAGNOSIS — E1165 Type 2 diabetes mellitus with hyperglycemia: Secondary | ICD-10-CM

## 2021-11-14 DIAGNOSIS — E559 Vitamin D deficiency, unspecified: Secondary | ICD-10-CM

## 2021-11-14 DIAGNOSIS — Z7985 Long-term (current) use of injectable non-insulin antidiabetic drugs: Secondary | ICD-10-CM

## 2021-11-14 DIAGNOSIS — Z6841 Body Mass Index (BMI) 40.0 and over, adult: Secondary | ICD-10-CM | POA: Diagnosis not present

## 2021-11-14 DIAGNOSIS — Z9189 Other specified personal risk factors, not elsewhere classified: Secondary | ICD-10-CM

## 2021-11-14 MED ORDER — VITAMIN D (ERGOCALCIFEROL) 1.25 MG (50000 UNIT) PO CAPS: 50000.0000 [IU] | ORAL_CAPSULE | ORAL | 0 refills | Status: DC

## 2021-11-14 MED ORDER — OZEMPIC (0.25 OR 0.5 MG/DOSE) 2 MG/1.5ML ~~LOC~~ SOPN: 0.5000 mg | PEN_INJECTOR | SUBCUTANEOUS | 0 refills | Status: DC

## 2021-11-20 ENCOUNTER — Ambulatory Visit: Payer: 59

## 2021-11-20 NOTE — Progress Notes (Signed)
? ? ? ?Chief Complaint:  ? ?OBESITY ?Tanya Houston is here to discuss her progress with her obesity treatment plan along with follow-up of her obesity related diagnoses. [name] is on the Category 3 Plan and states she is following her eating plan approximately 50% of the time. [name] states she is walking for 30 minutes 3 times per week. ? ?Today's visit was #: 6 ?Starting weight: 310 lbs ?Starting date: 08/23/2021 ?Today's weight: 298 lbs ?Today's date: 11/14/2021 ?Total lbs lost to date: 12 ?Total lbs lost since last in-office visit: 0 ? ?Interim History: Tanya Houston menstrual cycle is coming back and feels bloated. She is also experiencing constipation with Ozempic. Working on getting all food in on [place]. Sometimes her husband questions portion sizes and she recognizes that she starts questioning quantity. For snack calories she is doing Pringles containers and local mixed nut and then cheese. ? ?Subjective:  ? ?1. Vitamin D deficiency ?Tanya Houston is on prescription Vitamin D. She denies nausea, vomiting, or muscle weakness. Last Vit D level was of 30. ? ?2. Type 2 diabetes mellitus with hyperglycemia, without long-term current use of insulin ([place]) ?Tanya Houston is on Ozempic with some constipation. Last A1c was 7.0. ? ?3. At risk for constipation ?Tanya Houston is at increased risk for constipation due to inadequate water intake, changes in diet, and/or use of medications such as GLP1 agonists. Tanya Houston denies hard, infrequent stools currently.  ? ?Assessment/Plan:  ? ?1. Vitamin D deficiency ?We will refill Vitamin D 50,000 IU weekly for 1 month. ? ?- Vitamin D, Ergocalciferol, (DRISDOL) 1.25 MG (50000 UNIT) CAPS capsule; Take 1 capsule (50,000 Units total) by mouth every 7 (seven) days.  Dispense: 4 capsule; Refill: 0 ? ?2. Type 2 diabetes mellitus with hyperglycemia, without long-term current use of insulin ([place]) ?We will refill Ozempic 0.5 mg SubQ weekly for 1 month. Lashan agreed to start Colace and Magnesium Citrate  in the PM. ? ?- Semaglutide,0.25 or 0.'5MG'$ /DOS, (OZEMPIC, 0.25 OR 0.5 MG/DOSE,) 2 MG/1.5ML SOPN; Inject 0.5 mg into the skin once a week.  Dispense: 1.5 mL; Refill: 0 ? ?3. At risk for constipation ?Tanya Houston was given approximately 15 minutes of counseling today regarding prevention of constipation. She was encouraged to increase water and fiber intake.  ? ?4. Obesity with current BMI of 48.2 ?Tanya Houston is currently in the action stage of change. As such, her goal is to continue with weight loss efforts. She has agreed to the Category 3 Plan.  ? ?Exercise goals: All adults should avoid inactivity. Some physical activity is better than none, and adults who participate in any amount of physical activity gain some health benefits. ? ?Behavioral modification strategies: increasing lean protein intake, meal planning and cooking strategies, and keeping healthy foods in the home. ? ?Tanya Houston has agreed to follow-up with our clinic in 3 weeks. She was informed of the importance of frequent follow-up visits to maximize her success with intensive lifestyle modifications for her multiple health conditions.  ? ?Objective:  ? ?Blood pressure 122/75, pulse 76, temperature 97.6 ?F (36.4 ?C), height '5\' 6"'$  (1.676 m), weight 298 lb (135.2 kg), last menstrual period 11/05/2021, SpO2 97 %. ?Body mass index is 48.1 kg/m?. ? ?General: Cooperative, alert, well developed, in no acute distress. ?HEENT: Conjunctivae and lids unremarkable. ?Cardiovascular: Regular rhythm.  ?Lungs: Normal work of breathing. ?Neurologic: No focal deficits.  ? ?Lab Results  ?Component Value Date  ? CREATININE 0.75 08/23/2021  ? BUN 11 08/23/2021  ? NA 140 08/23/2021  ? K 4.4 08/23/2021  ?  CL 103 08/23/2021  ? CO2 24 08/23/2021  ? ?Lab Results  ?Component Value Date  ? ALT 14 08/23/2021  ? AST 15 08/23/2021  ? ALKPHOS 121 08/23/2021  ? BILITOT 0.4 08/23/2021  ? ?Lab Results  ?Component Value Date  ? HGBA1C 7.0 (H) 08/23/2021  ? HGBA1C 6.1 (A) 03/09/2020  ? HGBA1C  6.4 (A) 12/09/2019  ? HGBA1C 7.1 (H) 04/24/2019  ? HGBA1C 8.2 (H) 01/08/2019  ? ?Lab Results  ?Component Value Date  ? INSULIN 27.4 (H) 08/23/2021  ? ?Lab Results  ?Component Value Date  ? TSH 1.440 08/23/2021  ? ?Lab Results  ?Component Value Date  ? CHOL 220 (H) 08/23/2021  ? HDL 52 08/23/2021  ? LDLCALC 153 (H) 08/23/2021  ? TRIG 82 08/23/2021  ? CHOLHDL 4.2 08/23/2021  ? ?Lab Results  ?Component Value Date  ? VD25OH 30.5 08/23/2021  ? VD25OH 8.52 (L) 01/08/2019  ? ?Lab Results  ?Component Value Date  ? WBC 5.8 08/23/2021  ? HGB 12.3 08/23/2021  ? HCT 38.6 08/23/2021  ? MCV 78 (L) 08/23/2021  ? PLT 227 08/23/2021  ? ?Lab Results  ?Component Value Date  ? FERRITIN 44.7 01/08/2019  ? ?Attestation Statements:  ? ?Reviewed by clinician on day of visit: allergies, medications, problem list, medical history, surgical history, family history, social history, and previous encounter notes. ? ? ?I, Trixie Dredge, am acting as transcriptionist for Coralie Common, MD. ? ?I have reviewed the above documentation for accuracy and completeness, and I agree with the above. Coralie Common, MD ? ? ?

## 2021-11-22 ENCOUNTER — Other Ambulatory Visit: Payer: Self-pay | Admitting: Family Medicine

## 2021-11-22 DIAGNOSIS — J45909 Unspecified asthma, uncomplicated: Secondary | ICD-10-CM

## 2021-11-28 ENCOUNTER — Ambulatory Visit (INDEPENDENT_AMBULATORY_CARE_PROVIDER_SITE_OTHER): Payer: 59 | Admitting: Nurse Practitioner

## 2021-11-28 ENCOUNTER — Encounter (INDEPENDENT_AMBULATORY_CARE_PROVIDER_SITE_OTHER): Payer: Self-pay | Admitting: Nurse Practitioner

## 2021-11-28 ENCOUNTER — Ambulatory Visit: Admission: RE | Admit: 2021-11-28 | Payer: 59 | Source: Ambulatory Visit

## 2021-11-28 VITALS — BP 117/76 | HR 72 | Temp 98.2°F | Ht 66.0 in | Wt 297.0 lb

## 2021-11-28 DIAGNOSIS — K5900 Constipation, unspecified: Secondary | ICD-10-CM

## 2021-11-28 DIAGNOSIS — E669 Obesity, unspecified: Secondary | ICD-10-CM | POA: Diagnosis not present

## 2021-11-28 DIAGNOSIS — E1165 Type 2 diabetes mellitus with hyperglycemia: Secondary | ICD-10-CM

## 2021-11-28 DIAGNOSIS — Z7985 Long-term (current) use of injectable non-insulin antidiabetic drugs: Secondary | ICD-10-CM

## 2021-11-28 DIAGNOSIS — Z6841 Body Mass Index (BMI) 40.0 and over, adult: Secondary | ICD-10-CM

## 2021-12-04 NOTE — Progress Notes (Signed)
? ? ? ?Chief Complaint:  ? ?OBESITY ?Tanya Houston is here to discuss her progress with her obesity treatment plan along with follow-up of her obesity related diagnoses. Tanya Houston is on the Category 3 Plan and states she is following her eating plan approximately 30% of the time. Tanya Houston states she is doing yard work and treadmill for 60 minutes 1 times per week. ? ?Today's visit was #: 7 ?Starting weight: 310 lbs ?Starting date: 08/23/2021 ?Today's weight: 297 lbs ?Today's date: 11/28/2021 ?Total lbs lost to date: 13 lbs ?Total lbs lost since last in-office visit: 1 lb ? ?Interim History: Tanya Houston notes she has gotten off track since her last visit. She cooked a big meal for Easter. She is not drinking enough water. She is drinking coffee and a diet soda daily. Her husband still questions meal plan and portion sizes.  ? ?Subjective:  ? ?1. Type 2 diabetes mellitus with hyperglycemia, without long-term current use of insulin ([place]) ?Tanya Houston is currently taking Ozempic 0.5 mg. She reports side effects of constipation. Her last A1C was 7.0. ? ?2. Constipation, unspecified constipation type ?Tanya Houston has taken Colace twice since her last visit. She still has not had a bowel movement.  ? ?Assessment/Plan:  ? ?1. Type 2 diabetes mellitus with hyperglycemia, without long-term current use of insulin ([place]) ?Tanya Houston agrees to decrease Ozempic 0.25 mg due to side effects. Good blood sugar control is important to decrease the likelihood of diabetic complications such as nephropathy, neuropathy, limb loss, blindness, coronary artery disease, and death. Intensive lifestyle modification including diet, exercise and weight loss are the first line of treatment for diabetes.  ? ?2. Constipation, unspecified constipation type ?Tanya Houston will take Colace and Miralax this week. We discussed side effects. She is to let me know if her constipation worsens or persists.  Tanya Houston was informed that a decrease in bowel movement frequency is normal  while losing weight, but stools should not be hard or painful. Orders and follow up as documented in patient record.  ? ?Counseling ?Getting to Good Bowel Health: Your goal is to have one soft bowel movement each day. Drink at least 8 glasses of water each day. Eat plenty of fiber (goal is over 25 grams each day). It is best to get most of your fiber from dietary sources which includes leafy green vegetables, fresh fruit, and whole grains. You may need to add fiber with the help of OTC fiber supplements. These include Metamucil, Citrucel, and Flaxseed. If you are still having trouble, try adding Miralax or Magnesium Citrate. If all of these changes do not work, Cabin crew.  ? ?3. Obesity with current BMI of 48.1 ?Tanya Houston is currently in the action stage of change. As such, her goal is to continue with weight loss efforts. She has agreed to the Category 3 Plan.  ? ?Handouts: Protein that can be substituted for two ounces of meat.  ? ?Exercise goals:  As is.  ? ?Behavioral modification strategies: increasing lean protein intake, increasing water intake, and no skipping meals. ? ?Tanya Houston has agreed to follow-up with our clinic in 3 weeks. She was informed of the importance of frequent follow-up visits to maximize her success with intensive lifestyle modifications for her multiple health conditions.  ? ?Objective:  ? ?Blood pressure 117/76, pulse 72, temperature 98.2 ?F (36.8 ?C), height '5\' 6"'$  (1.676 m), weight 297 lb (134.7 kg), last menstrual period 11/05/2021, SpO2 97 %. ?Body mass index is 47.94 kg/m?. ? ?General: Cooperative, alert, well developed, in no acute  distress. ?HEENT: Conjunctivae and lids unremarkable. ?Cardiovascular: Regular rhythm.  ?Lungs: Normal work of breathing. ?Neurologic: No focal deficits.  ? ?Lab Results  ?Component Value Date  ? CREATININE 0.75 08/23/2021  ? BUN 11 08/23/2021  ? NA 140 08/23/2021  ? K 4.4 08/23/2021  ? CL 103 08/23/2021  ? CO2 24 08/23/2021  ? ?Lab Results   ?Component Value Date  ? ALT 14 08/23/2021  ? AST 15 08/23/2021  ? ALKPHOS 121 08/23/2021  ? BILITOT 0.4 08/23/2021  ? ?Lab Results  ?Component Value Date  ? HGBA1C 7.0 (H) 08/23/2021  ? HGBA1C 6.1 (A) 03/09/2020  ? HGBA1C 6.4 (A) 12/09/2019  ? HGBA1C 7.1 (H) 04/24/2019  ? HGBA1C 8.2 (H) 01/08/2019  ? ?Lab Results  ?Component Value Date  ? INSULIN 27.4 (H) 08/23/2021  ? ?Lab Results  ?Component Value Date  ? TSH 1.440 08/23/2021  ? ?Lab Results  ?Component Value Date  ? CHOL 220 (H) 08/23/2021  ? HDL 52 08/23/2021  ? LDLCALC 153 (H) 08/23/2021  ? TRIG 82 08/23/2021  ? CHOLHDL 4.2 08/23/2021  ? ?Lab Results  ?Component Value Date  ? VD25OH 30.5 08/23/2021  ? VD25OH 8.52 (L) 01/08/2019  ? ?Lab Results  ?Component Value Date  ? WBC 5.8 08/23/2021  ? HGB 12.3 08/23/2021  ? HCT 38.6 08/23/2021  ? MCV 78 (L) 08/23/2021  ? PLT 227 08/23/2021  ? ?Lab Results  ?Component Value Date  ? FERRITIN 44.7 01/08/2019  ? ?Attestation Statements:  ? ?Reviewed by clinician on day of visit: allergies, medications, problem list, medical history, surgical history, family history, social history, and previous encounter notes. ? ?I, Lizbeth Bark, RMA, am acting as Location manager for Everardo Pacific, FNP. ? ?I have reviewed the above documentation for accuracy and completeness, and I agree with the above. Everardo Pacific, FNP  ?

## 2021-12-18 ENCOUNTER — Ambulatory Visit
Admission: RE | Admit: 2021-12-18 | Discharge: 2021-12-18 | Disposition: A | Discharge status: Home / Self Care | Payer: 59 | Source: Ambulatory Visit | Attending: Obstetrics and Gynecology | Admitting: Obstetrics and Gynecology

## 2021-12-18 ENCOUNTER — Encounter (INDEPENDENT_AMBULATORY_CARE_PROVIDER_SITE_OTHER): Payer: Self-pay | Admitting: Family Medicine

## 2021-12-18 ENCOUNTER — Ambulatory Visit (INDEPENDENT_AMBULATORY_CARE_PROVIDER_SITE_OTHER): Payer: 59 | Admitting: Family Medicine

## 2021-12-18 VITALS — BP 112/75 | HR 81 | Temp 97.7°F | Ht 66.0 in | Wt 296.0 lb

## 2021-12-18 DIAGNOSIS — Z6841 Body Mass Index (BMI) 40.0 and over, adult: Secondary | ICD-10-CM

## 2021-12-18 DIAGNOSIS — Z9189 Other specified personal risk factors, not elsewhere classified: Secondary | ICD-10-CM

## 2021-12-18 DIAGNOSIS — E559 Vitamin D deficiency, unspecified: Secondary | ICD-10-CM

## 2021-12-18 DIAGNOSIS — E1165 Type 2 diabetes mellitus with hyperglycemia: Secondary | ICD-10-CM | POA: Diagnosis not present

## 2021-12-18 DIAGNOSIS — Z7985 Long-term (current) use of injectable non-insulin antidiabetic drugs: Secondary | ICD-10-CM

## 2021-12-18 DIAGNOSIS — E669 Obesity, unspecified: Secondary | ICD-10-CM

## 2021-12-18 DIAGNOSIS — N939 Abnormal uterine and vaginal bleeding, unspecified: Secondary | ICD-10-CM

## 2021-12-18 MED ORDER — VITAMIN D (ERGOCALCIFEROL) 1.25 MG (50000 UNIT) PO CAPS: 50000.0000 [IU] | ORAL_CAPSULE | ORAL | 0 refills | Status: AC

## 2021-12-18 MED ORDER — SEMAGLUTIDE (1 MG/DOSE) 4 MG/3ML ~~LOC~~ SOPN: 1.0000 mg | PEN_INJECTOR | SUBCUTANEOUS | 0 refills | Status: AC

## 2021-12-26 NOTE — Progress Notes (Signed)
Chief Complaint:   OBESITY Tanya Houston is here to discuss her progress with her obesity treatment plan along with follow-up of her obesity related diagnoses. Tanya Houston is on the Category 3 Plan and states she is following her eating plan approximately 75% of the time. Tanya Houston states she is walking the treadmill and other walking 15-45 minutes 2-3 times per week.  Today's visit was #: 8 Starting weight: 310 lbs Starting date: 010/11/2021 Today's weight: 296 lbs Today's date: 12/18/2021 Total lbs lost to date: 14 lbs Total lbs lost since last in-office visit: 1 lb  Interim History: Tanya Houston is able to eat all food on Category 3 most days.  Some days she forgets to eat or cannot finish all the food at lunch.  For snacks she is eating animal crackers, 100 calorie nuts with dates, pringles.  Tanya Houston is able to get 8 oz in consistently at supper.  She has no plans for the upcoming few weeks.   Subjective:   1. Vitamin D deficiency Tanya Houston denies any nausea, vomiting, or muscle weakness, but complains of fatigue. Tanya Houston is on prescription Vitamin D.  Last Vitamin D level was 30.5.  2. Type 2 diabetes mellitus with hyperglycemia, without long-term current use of insulin (HCC) Tanya Houston is on Ozempic 0.5 mg weekly.  Tanya Houston denies any GI side effects at this dose.  3. At risk for side effect of medication Tanya Houston is at risk or side effect of medication due to increase dose of Ozempic.   Assessment/Plan:   1. Vitamin D deficiency Tanya Houston has agreed to continue prescription Vitamin D.  We will refill today.  See below.  - Vitamin D, Ergocalciferol, (DRISDOL) 1.25 MG (50000 UNIT) CAPS capsule; Take 1 capsule (50,000 Units total) by mouth every 7 (seven) days.  Dispense: 4 capsule; Refill: 0  2. Type 2 diabetes mellitus with hyperglycemia, without long-term current use of insulin (HCC) Tanya Houston has agreed to increase Ozempic to '1mg'$  SQ every week.  See below. Discussed with Tanya Houston  side effects and adverse reactions and will notify us on my chart if she experiences any of these.    - Semaglutide, 1 MG/DOSE, 4 MG/3ML SOPN; Inject 1 mg as directed once a week.  Dispense: 3 mL; Refill: 0  3. At risk for side effect of medication Tanya Houston was given approximately 15 minutes of drug side effect counseling today.  We discussed side effect possibility and risk versus benefits. Tanya Houston agreed to the medication and will contact this office if these side effects are intolerable.  Repetitive spaced learning was employed today to elicit superior memory formation and behavioral change.   4. Obesity with current BMI of 47.8 Tanya Houston is currently in the action stage of change. As such, her goal is to continue with weight loss efforts. She has agreed to the Category 3 Plan.   Exercise goals:  As is.  Behavioral modification strategies: increasing lean protein intake, meal planning and cooking strategies, keeping healthy foods in the home, and planning for success.  Tanya Houston has agreed to follow-up with our clinic in 3 weeks. She was informed of the importance of frequent follow-up visits to maximize her success with intensive lifestyle modifications for her multiple health conditions.   Objective:   Blood pressure 112/75, pulse 81, temperature 97.7 F (36.5 C), height '5\' 6"'$  (1.676 m), weight 296 lb (134.3 kg), SpO2 98 %. Body mass index is 47.78 kg/m.  General: Cooperative, alert, well developed, in no acute distress. HEENT: Conjunctivae and lids unremarkable. Cardiovascular:  Regular rhythm.  Lungs: Normal work of breathing. Neurologic: No focal deficits.   Lab Results  Component Value Date   CREATININE 0.75 08/23/2021   BUN 11 08/23/2021   NA 140 08/23/2021   K 4.4 08/23/2021   CL 103 08/23/2021   CO2 24 08/23/2021   Lab Results  Component Value Date   ALT 14 08/23/2021   AST 15 08/23/2021   ALKPHOS 121 08/23/2021   BILITOT 0.4 08/23/2021   Lab Results   Component Value Date   HGBA1C 7.0 (H) 08/23/2021   HGBA1C 6.1 (A) 03/09/2020   HGBA1C 6.4 (A) 12/09/2019   HGBA1C 7.1 (H) 04/24/2019   HGBA1C 8.2 (H) 01/08/2019   Lab Results  Component Value Date   INSULIN 27.4 (H) 08/23/2021   Lab Results  Component Value Date   TSH 1.440 08/23/2021   Lab Results  Component Value Date   CHOL 220 (H) 08/23/2021   HDL 52 08/23/2021   LDLCALC 153 (H) 08/23/2021   TRIG 82 08/23/2021   CHOLHDL 4.2 08/23/2021   Lab Results  Component Value Date   VD25OH 30.5 08/23/2021   VD25OH 8.52 (L) 01/08/2019   Lab Results  Component Value Date   WBC 5.8 08/23/2021   HGB 12.3 08/23/2021   HCT 38.6 08/23/2021   MCV 78 (L) 08/23/2021   PLT 227 08/23/2021   Lab Results  Component Value Date   FERRITIN 44.7 01/08/2019   Attestation Statements:   Reviewed by clinician on day of visit: allergies, medications, problem list, medical history, surgical history, family history, social history, and previous encounter notes.  I, Davy Pique, RMA, am acting as transcriptionist for Coralie Common, MD.  I have reviewed the above documentation for accuracy and completeness, and I agree with the above. - Coralie Common, MD

## 2022-01-11 ENCOUNTER — Ambulatory Visit (INDEPENDENT_AMBULATORY_CARE_PROVIDER_SITE_OTHER): Payer: 59 | Admitting: Family Medicine

## 2022-01-11 ENCOUNTER — Telehealth (INDEPENDENT_AMBULATORY_CARE_PROVIDER_SITE_OTHER): Payer: Self-pay | Admitting: Family Medicine

## 2022-01-11 NOTE — Telephone Encounter (Signed)
Pt unhappy that appt is at 12:20 today 01/11/22 and says she made it for 2:20, and she wrote it down so she knows we got it wrong. Upon review, there are no canceled or rescheduled times in appointment history. Apologized for any miscommunication but Pt states she will not be back.

## 2022-03-28 ENCOUNTER — Encounter (INDEPENDENT_AMBULATORY_CARE_PROVIDER_SITE_OTHER): Payer: Self-pay

## 2022-05-17 ENCOUNTER — Other Ambulatory Visit: Payer: Self-pay

## 2022-05-17 ENCOUNTER — Emergency Department (HOSPITAL_BASED_OUTPATIENT_CLINIC_OR_DEPARTMENT_OTHER)
Admission: EM | Admit: 2022-05-17 | Discharge: 2022-05-17 | Disposition: A | Discharge status: Home / Self Care | Payer: 59 | Attending: Emergency Medicine | Admitting: Emergency Medicine

## 2022-05-17 DIAGNOSIS — Z794 Long term (current) use of insulin: Secondary | ICD-10-CM | POA: Diagnosis not present

## 2022-05-17 DIAGNOSIS — E119 Type 2 diabetes mellitus without complications: Secondary | ICD-10-CM | POA: Diagnosis not present

## 2022-05-17 DIAGNOSIS — R21 Rash and other nonspecific skin eruption: Secondary | ICD-10-CM | POA: Diagnosis present

## 2022-05-17 DIAGNOSIS — Z91040 Latex allergy status: Secondary | ICD-10-CM | POA: Insufficient documentation

## 2022-05-17 DIAGNOSIS — Z7951 Long term (current) use of inhaled steroids: Secondary | ICD-10-CM | POA: Insufficient documentation

## 2022-05-17 DIAGNOSIS — J45909 Unspecified asthma, uncomplicated: Secondary | ICD-10-CM | POA: Diagnosis not present

## 2022-05-17 DIAGNOSIS — Z91010 Allergy to peanuts: Secondary | ICD-10-CM | POA: Diagnosis not present

## 2022-05-17 MED ORDER — PREDNISONE 20 MG PO TABS: 40.0000 mg | ORAL_TABLET | Freq: Every day | ORAL | 0 refills | Status: DC

## 2022-05-17 MED ORDER — DIPHENHYDRAMINE HCL 25 MG PO CAPS
25.0000 mg | ORAL_CAPSULE | Freq: Once | ORAL | Status: AC
  Administered 2022-05-17: 25 mg via ORAL
  Filled 2022-05-17: qty 1

## 2022-05-17 MED ORDER — PREDNISONE 20 MG PO TABS: 40.0000 mg | ORAL_TABLET | Freq: Every day | ORAL | 0 refills | Status: AC

## 2022-05-17 MED ORDER — PREDNISONE 20 MG PO TABS
40.0000 mg | ORAL_TABLET | Freq: Once | ORAL | Status: AC
  Administered 2022-05-17: 40 mg via ORAL
  Filled 2022-05-17: qty 2

## 2022-05-17 NOTE — ED Provider Notes (Signed)
[facility] Provider Note   CSN: [id number] Arrival date & time: 05/17/22  2106     History  Chief Complaint  Patient presents with   Allergic Reaction    Tanya Houston is a 56 y.o. female.  Patient here with hives.  History of eczema.  She has been on antibiotic for UTI for the last 2 days.  She has no respiratory issues.  She has been having some hives on her abdomen now underneath her breast tissues and on her neck.  She has been itching on her feet for a while.  History of asthma, diabetes.  Has prior allergies.  The history is provided by the patient.       Home Medications Prior to Admission medications   Medication Sig Start Date End Date Taking? Authorizing Provider  predniSONE (DELTASONE) 20 MG tablet Take 2 tablets (40 mg total) by mouth daily for 2 days. 05/17/22 05/19/22 Yes Verlon Carcione, DO  albuterol (VENTOLIN HFA) 108 (90 Base) MCG/ACT inhaler TAKE 2 PUFFS BY MOUTH EVERY 6 HOURS AS NEEDED FOR WHEEZE OR SHORTNESS OF BREATH Patient taking differently: Inhale 2 puffs into the lungs every 6 (six) hours as needed for wheezing or shortness of breath. 03/29/20   Burchette, Alinda Sierras, MD  cetirizine (ZYRTEC) 10 MG tablet TAKE 1 TABLET BY MOUTH EVERY DAY 11/13/19   Martinique, Betty G, MD  Docusate Sodium (COLACE PO) Take by mouth.    [provider]  fluticasone (FLONASE) 50 MCG/ACT nasal spray Place 2 sprays into both nostrils daily. 11/07/18   Martinique, Betty G, MD  hydrOXYzine (ATARAX/VISTARIL) 25 MG tablet TAKE 1 TABLET (25 MG TOTAL) BY MOUTH 3 (THREE) TIMES DAILY AS NEEDED FOR ITCHING. 06/20/20   Burchette, Alinda Sierras, MD  Insulin Pen Needle (B-D UF III MINI PEN NEEDLES) 31G X 5 MM MISC USE 1 NEEDLE DAILY FOR VICTOZA INJECTIONS. DX CODE E11.65 02/26/20   Burchette, Alinda Sierras, MD  MAGNESIUM CITRATE PO Take by mouth.    [provider]  meloxicam (MOBIC) 15 MG tablet Take 15 mg by mouth daily. 03/06/21   [provider]  nystatin  (MYCOSTATIN/NYSTOP) powder Apply 1 application topically 3 (three) times daily as needed (irritation).    [provider]  pantoprazole (PROTONIX) 40 MG tablet Take 40 mg by mouth daily. 02/25/21   [provider]  Semaglutide, 1 MG/DOSE, 4 MG/3ML SOPN Inject 1 mg as directed once a week. 12/18/21   Laqueta Linden, MD  Vitamin D, Ergocalciferol, (DRISDOL) 1.25 MG (50000 UNIT) CAPS capsule Take 1 capsule (50,000 Units total) by mouth every 7 (seven) days. 12/18/21   Laqueta Linden, MD      Allergies    Adhesive [tape], Fish allergy, Latex, Metformin and related, Peanut oil, and Sulfa antibiotics    Review of Systems   Review of Systems  Physical Exam Updated Vital Signs BP 130/74   Pulse 80   Temp 98.2 F (36.8 C) (Oral)   Resp 18   Ht '5\' 6"'$  (1.676 m)   Wt 132.5 kg   SpO2 94%   BMI 47.13 kg/m  Physical Exam Vitals and nursing note reviewed.  Constitutional:      General: She is not in acute distress.    Appearance: She is well-developed.  HENT:     Head: Normocephalic and atraumatic.     Nose: Nose normal.     Mouth/Throat:     Mouth: Mucous membranes are moist.  Eyes:  Extraocular Movements: Extraocular movements intact.     Conjunctiva/sclera: Conjunctivae normal.     Pupils: Pupils are equal, round, and reactive to light.  Cardiovascular:     Rate and Rhythm: Normal rate and regular rhythm.     Pulses: Normal pulses.     Heart sounds: Normal heart sounds. No murmur heard. Pulmonary:     Effort: Pulmonary effort is normal. No respiratory distress.     Breath sounds: Normal breath sounds.  Abdominal:     Palpations: Abdomen is soft.     Tenderness: There is no abdominal tenderness.  Musculoskeletal:        General: No swelling.     Cervical back: Neck supple.  Skin:    General: Skin is warm and dry.     Capillary Refill: Capillary refill takes less than 2 seconds.     Findings: Rash present.     Comments: Hives on abdomen, chest, neck   Neurological:     Mental Status: She is alert.  Psychiatric:        Mood and Affect: Mood normal.     ED Results / Procedures / Treatments   Labs (all labs ordered are listed, but only abnormal results are displayed) Labs Reviewed - No data to display  EKG EKG Interpretation  Date/Time:  Thursday May 17 2022 22:40:39 EDT Ventricular Rate:  74 PR Interval:  116 QRS Duration: 81 QT Interval:  377 QTC Calculation: 419 R Axis:   82 Text Interpretation: Sinus rhythm Confirmed by Ronnald Nian, Dashawna Delbridge ([id number]) on 05/17/2022 10:49:03 PM  Radiology No results found.  Procedures Procedures    Medications Ordered in ED Medications  predniSONE (DELTASONE) tablet 40 mg (40 mg Oral Given 05/17/22 2140)  diphenhydrAMINE (BENADRYL) capsule 25 mg (25 mg Oral Given 05/17/22 2140)    ED Course/ Medical Decision Making/ A&P                           Medical Decision Making Risk Prescription drug management.   Tanya Houston is here with hives.  History of high cholesterol, eczema.  Rash seems more to be eczematous in nature than likely allergic reaction to her antibiotic.  Her urinalysis per my review of chart from 2 days ago does not appear to be consistent with infection.  We will have her stop antibiotic.  Overall there is no signs of anaphylaxis.  EKG was done in triage that shows sinus rhythm.  No ischemic changes.  She has had hives for the last several days.  We will put her on steroids and have her take Benadryl and follow-up with her primary care doctor.  Discharged in good condition.  This chart was dictated using voice recognition software.  Despite best efforts to proofread,  errors can occur which can change the documentation meaning.         Final Clinical Impression(s) / ED Diagnoses Final diagnoses:  Rash    Rx / DC Orders ED Discharge Orders          Ordered    predniSONE (DELTASONE) 20 MG tablet  Daily        05/17/22 2250              Lennice Sites, DO 05/17/22 2252

## 2022-05-17 NOTE — ED Triage Notes (Addendum)
Pt to ED c/o allergic reaction that started 2 days ago when started on RX Cefuroxime. Reports continued to take medication despite symptoms diarrhea and hives. Reports progressively getting worse. Reports hx " itching"

## 2022-05-31 ENCOUNTER — Other Ambulatory Visit (HOSPITAL_COMMUNITY): Payer: Self-pay

## 2022-06-02 ENCOUNTER — Other Ambulatory Visit (HOSPITAL_COMMUNITY): Payer: Self-pay

## 2022-06-02 MED ORDER — OZEMPIC (1 MG/DOSE) 4 MG/3ML ~~LOC~~ SOPN
1.0000 mg | PEN_INJECTOR | SUBCUTANEOUS | 8 refills | Status: DC
  Filled 2022-06-02: qty 3, 28d supply, fill #0
  Filled 2022-06-25 (×2): qty 3, 28d supply, fill #1
  Filled 2022-11-16: qty 3, 28d supply, fill #2

## 2022-06-25 ENCOUNTER — Other Ambulatory Visit (HOSPITAL_COMMUNITY): Payer: Self-pay

## 2022-07-19 ENCOUNTER — Other Ambulatory Visit (HOSPITAL_COMMUNITY): Payer: Self-pay

## 2022-07-19 MED ORDER — OZEMPIC (2 MG/DOSE) 8 MG/3ML ~~LOC~~ SOPN
2.0000 mg | PEN_INJECTOR | SUBCUTANEOUS | 3 refills | Status: DC
  Filled 2022-07-19: qty 3, 28d supply, fill #0
  Filled 2022-08-23: qty 3, 28d supply, fill #1
  Filled 2022-09-20: qty 3, 28d supply, fill #2
  Filled 2022-10-13: qty 3, 28d supply, fill #3

## 2022-07-24 ENCOUNTER — Other Ambulatory Visit (HOSPITAL_COMMUNITY): Payer: Self-pay

## 2022-07-26 ENCOUNTER — Other Ambulatory Visit (HOSPITAL_COMMUNITY): Payer: Self-pay

## 2022-08-23 ENCOUNTER — Other Ambulatory Visit (HOSPITAL_COMMUNITY): Payer: Self-pay

## 2022-09-13 IMAGING — MG MM DIGITAL SCREENING BILAT W/ TOMO AND CAD
8 series · 8 of 24 positions shown · non-contrast
Comparison: Previous exam(s).

CLINICAL DATA: Screening.

EXAM:
DIGITAL SCREENING BILATERAL MAMMOGRAM WITH TOMOSYNTHESIS AND CAD
TECHNIQUE: Bilateral screening digital craniocaudal and mediolateral oblique
mammograms were obtained. Bilateral screening digital breast
tomosynthesis was performed. The images were evaluated with
computer-aided detection.

[L CC synth-2D]
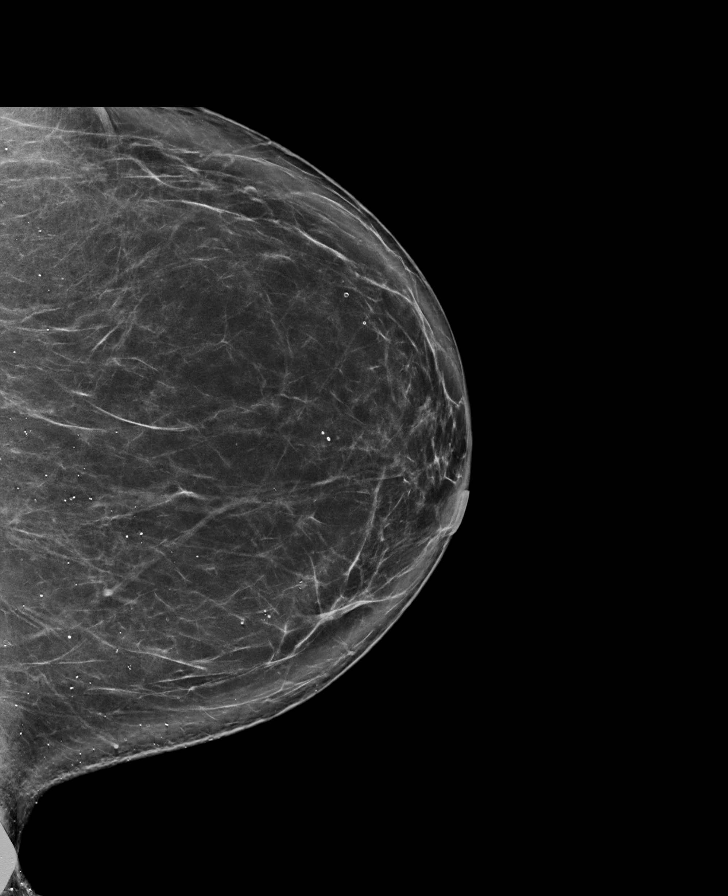

[L MLO synth-2D]
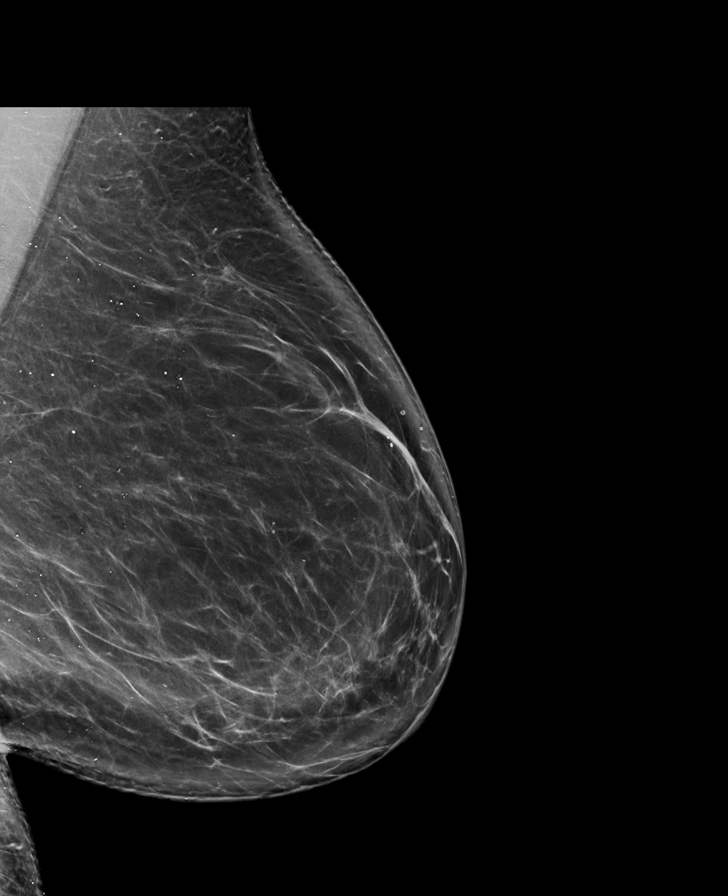

[R CC synth-2D]
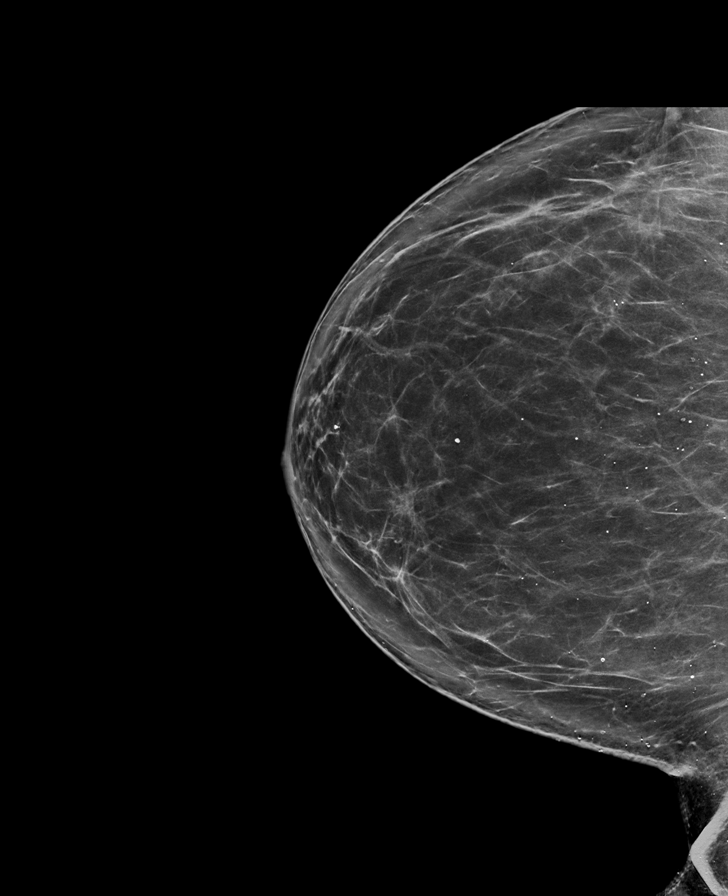

[R MLO synth-2D]
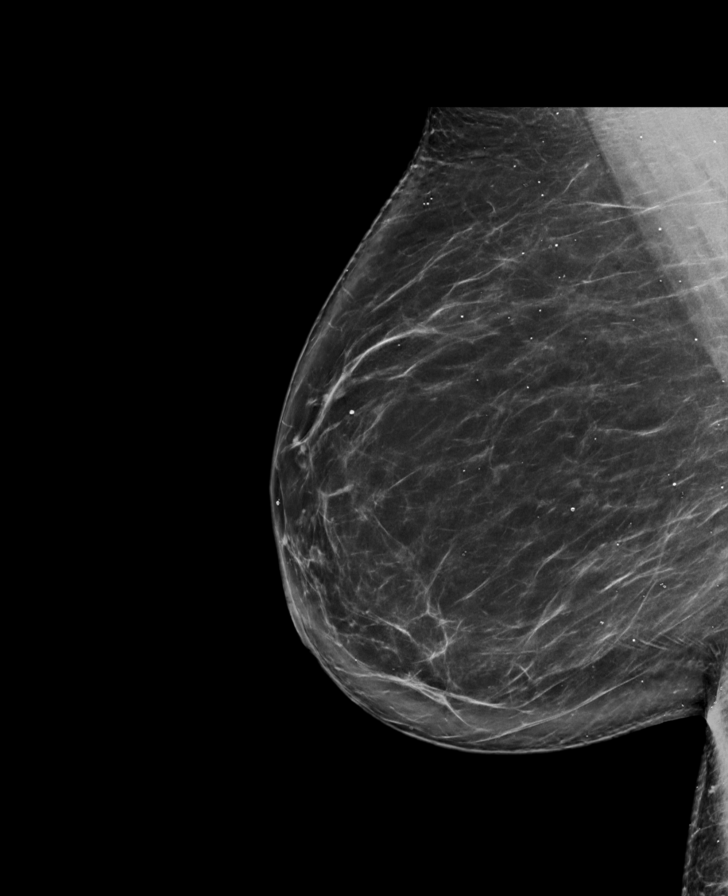

[R MLO tomo · tomo slice 53/105.0]
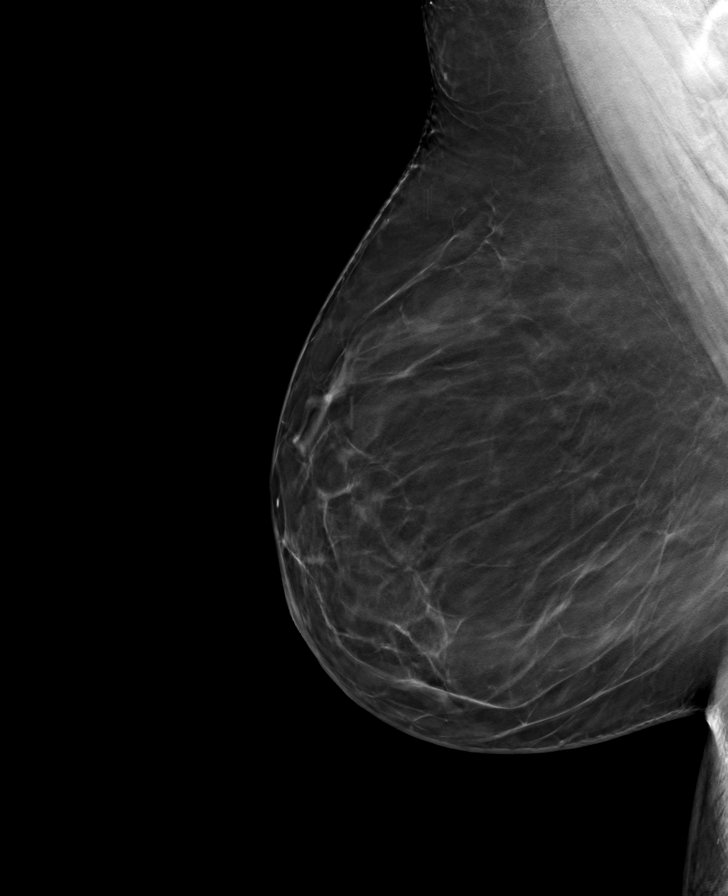

[L CC tomo · tomo slice 49/96.0]
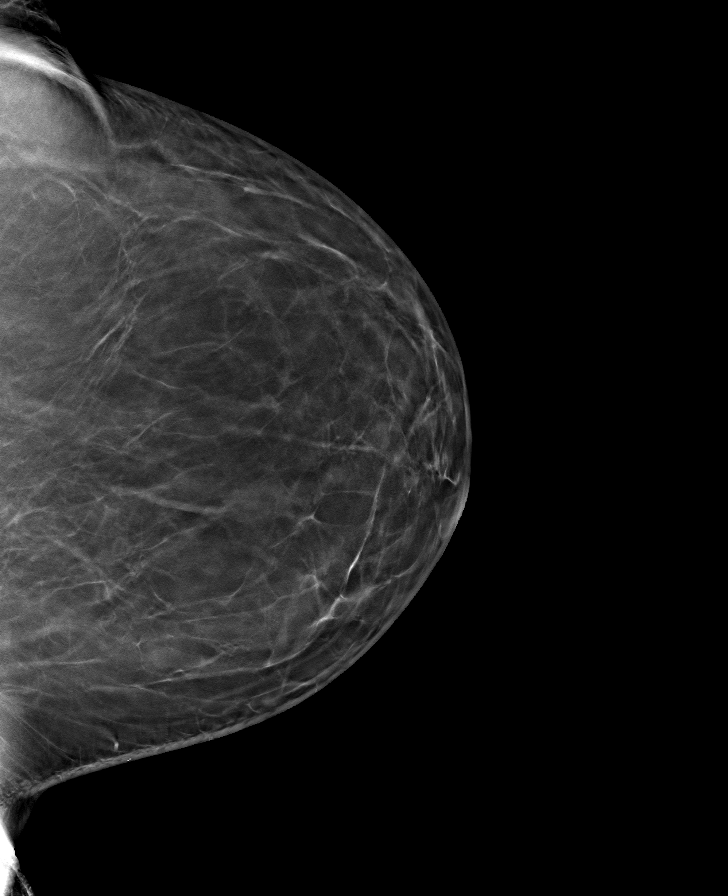

[R CC tomo · tomo slice 47/93.0]
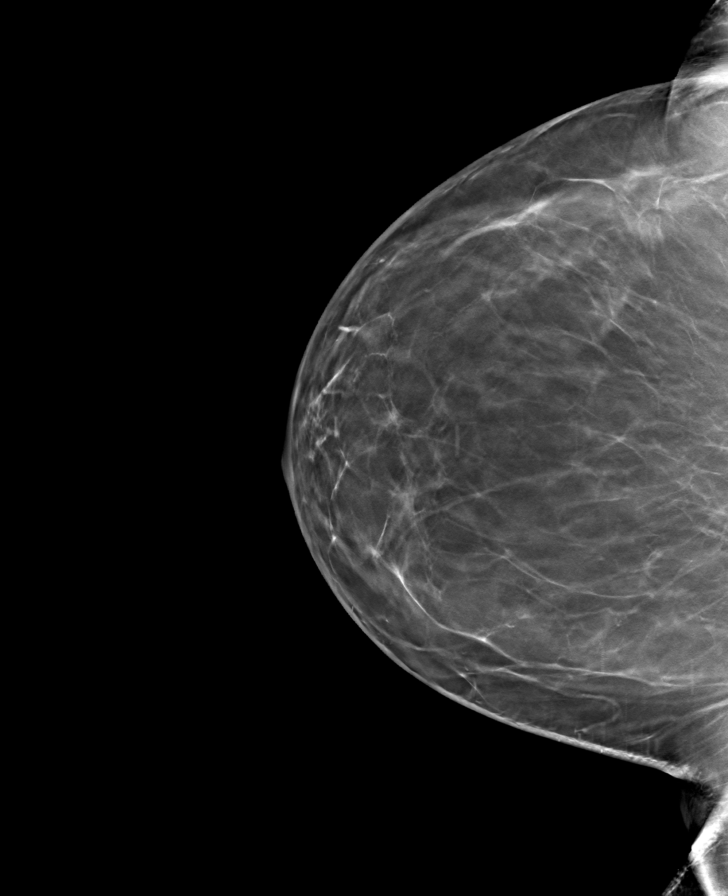

[L MLO tomo · tomo slice 51/101.0]
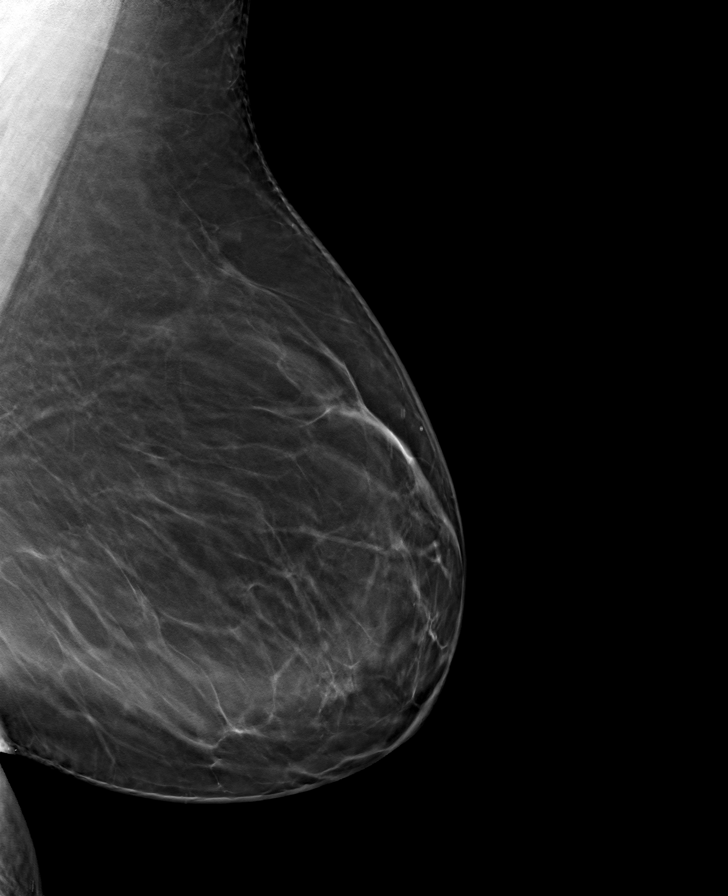

[8 of 24 positions shown; findings below may reference images not displayed]

ACR Breast Density Category b: There are scattered areas of
fibroglandular density.
FINDINGS: There are no findings suspicious for malignancy.
IMPRESSION: No mammographic evidence of malignancy. A result letter of this
screening mammogram will be mailed directly to the patient.

RECOMMENDATION:
Screening mammogram in one year. (Code:[id number]-[id number])

BI-RADS CATEGORY  1: Negative.

## 2022-09-20 ENCOUNTER — Other Ambulatory Visit (HOSPITAL_COMMUNITY): Payer: Self-pay

## 2022-11-05 ENCOUNTER — Other Ambulatory Visit: Payer: Self-pay | Admitting: Obstetrics and Gynecology

## 2022-11-05 DIAGNOSIS — Z1231 Encounter for screening mammogram for malignant neoplasm of breast: Secondary | ICD-10-CM

## 2022-11-16 ENCOUNTER — Other Ambulatory Visit (HOSPITAL_COMMUNITY): Payer: Self-pay

## 2022-11-16 MED ORDER — SEMAGLUTIDE (2 MG/DOSE) 8 MG/3ML ~~LOC~~ SOPN
2.0000 mg | PEN_INJECTOR | SUBCUTANEOUS | 3 refills | Status: DC
  Filled 2022-11-16 – 2022-11-17 (×2): qty 3, 28d supply, fill #0
  Filled 2022-12-08: qty 3, 28d supply, fill #1
  Filled 2023-01-06: qty 3, 28d supply, fill #2
  Filled 2023-02-04 (×2): qty 3, 28d supply, fill #3

## 2022-11-17 ENCOUNTER — Other Ambulatory Visit (HOSPITAL_COMMUNITY): Payer: Self-pay

## 2022-12-08 ENCOUNTER — Other Ambulatory Visit (HOSPITAL_COMMUNITY): Payer: Self-pay

## 2022-12-21 ENCOUNTER — Ambulatory Visit: Payer: 59

## 2023-01-10 ENCOUNTER — Other Ambulatory Visit (HOSPITAL_COMMUNITY): Payer: Self-pay

## 2023-01-11 ENCOUNTER — Other Ambulatory Visit (HOSPITAL_COMMUNITY): Payer: Self-pay

## 2023-01-29 ENCOUNTER — Ambulatory Visit: Payer: 59

## 2023-01-30 ENCOUNTER — Ambulatory Visit
Admission: RE | Admit: 2023-01-30 | Discharge: 2023-01-30 | Disposition: A | Discharge status: Home / Self Care | Payer: 59 | Source: Ambulatory Visit | Attending: Obstetrics and Gynecology | Admitting: Obstetrics and Gynecology

## 2023-01-30 DIAGNOSIS — Z1231 Encounter for screening mammogram for malignant neoplasm of breast: Secondary | ICD-10-CM

## 2023-02-04 ENCOUNTER — Other Ambulatory Visit: Payer: Self-pay

## 2023-02-04 ENCOUNTER — Encounter: Payer: Self-pay | Admitting: Pharmacist

## 2023-02-04 ENCOUNTER — Other Ambulatory Visit (HOSPITAL_COMMUNITY): Payer: Self-pay

## 2023-02-05 ENCOUNTER — Other Ambulatory Visit (HOSPITAL_BASED_OUTPATIENT_CLINIC_OR_DEPARTMENT_OTHER): Payer: Self-pay

## 2023-02-05 ENCOUNTER — Other Ambulatory Visit: Payer: Self-pay

## 2023-02-07 ENCOUNTER — Other Ambulatory Visit: Payer: Self-pay

## 2023-02-12 ENCOUNTER — Other Ambulatory Visit: Payer: Self-pay | Admitting: Obstetrics and Gynecology

## 2023-02-12 DIAGNOSIS — R234 Changes in skin texture: Secondary | ICD-10-CM

## 2023-02-25 ENCOUNTER — Other Ambulatory Visit: Payer: Self-pay | Admitting: Obstetrics and Gynecology

## 2023-02-25 DIAGNOSIS — D259 Leiomyoma of uterus, unspecified: Secondary | ICD-10-CM

## 2023-03-08 ENCOUNTER — Other Ambulatory Visit (HOSPITAL_COMMUNITY): Payer: Self-pay

## 2023-03-11 ENCOUNTER — Other Ambulatory Visit (HOSPITAL_COMMUNITY): Payer: Self-pay

## 2023-03-11 MED ORDER — SEMAGLUTIDE (2 MG/DOSE) 8 MG/3ML ~~LOC~~ SOPN
2.0000 mg | PEN_INJECTOR | SUBCUTANEOUS | 3 refills | Status: DC
  Filled 2023-03-11: qty 3, 28d supply, fill #0
  Filled 2023-04-06: qty 3, 28d supply, fill #1
  Filled 2023-05-05: qty 3, 28d supply, fill #2
  Filled 2023-06-04: qty 3, 28d supply, fill #3

## 2023-03-18 ENCOUNTER — Inpatient Hospital Stay: Admission: RE | Admit: 2023-03-18 | Payer: 59 | Source: Ambulatory Visit

## 2023-03-22 ENCOUNTER — Other Ambulatory Visit (HOSPITAL_COMMUNITY): Payer: Self-pay

## 2023-04-01 ENCOUNTER — Inpatient Hospital Stay: Admission: RE | Admit: 2023-04-01 | Payer: 59 | Source: Ambulatory Visit

## 2023-04-08 ENCOUNTER — Other Ambulatory Visit (HOSPITAL_COMMUNITY): Payer: Self-pay

## 2023-04-12 ENCOUNTER — Other Ambulatory Visit (HOSPITAL_COMMUNITY): Payer: Self-pay

## 2023-04-25 ENCOUNTER — Encounter: Payer: Self-pay | Admitting: Internal Medicine

## 2023-04-25 ENCOUNTER — Ambulatory Visit: Payer: 59 | Attending: Internal Medicine | Admitting: Internal Medicine

## 2023-04-25 ENCOUNTER — Inpatient Hospital Stay: Admission: RE | Admit: 2023-04-25 | Payer: 59 | Source: Ambulatory Visit

## 2023-04-25 VITALS — BP 132/74 | HR 66 | Ht 66.0 in | Wt 307.0 lb

## 2023-04-25 DIAGNOSIS — R079 Chest pain, unspecified: Secondary | ICD-10-CM | POA: Diagnosis not present

## 2023-04-25 NOTE — Patient Instructions (Signed)
Medication Instructions:  Your physician recommends that you continue on your current medications as directed. Please refer to the Current Medication list given to you today.  *If you need a refill on your cardiac medications before your next appointment, please call your pharmacy*    Testing/Procedures: ECHO Your physician has requested that you have an echocardiogram. Echocardiography is a painless test that uses sound waves to create images of your heart. It provides your doctor with information about the size and shape of your heart and how well your heart's chambers and valves are working. This procedure takes approximately one hour. There are no restrictions for this procedure. Please do NOT wear cologne, perfume, aftershave, or lotions (deodorant is allowed). Please arrive 15 minutes prior to your appointment time.   Follow-Up: At [facility], you and your health needs are our priority.  As part of our continuing mission to provide you with exceptional heart care, we have created designated Provider Care Teams.  These Care Teams include your primary Cardiologist (physician) and Advanced Practice Providers (APPs -  Physician Assistants and Nurse Practitioners) who all work together to provide you with the care you need, when you need it.   Your next appointment:   As needed   Provider:   Dr Wyline Mood

## 2023-04-25 NOTE — Progress Notes (Signed)
Cardiology Office Note:  .   Date:  04/25/2023  ID:  Darienne Baus, DOB 1966-04-17, MRN [id number] PCP: Tommi Rumps, MD  [facility] Providers Cardiologist:  None    History of Present Illness: .   Tanya Houston is a 57 y.o. female hx of obesity, DM2, comes in today with chest pain.  She presents with intermittent chest discomfort described as a "twinge" in the chest area. The patient reports that this discomfort has started since they began working from home and has been accompanied by increased pain and swelling in the legs. The patient acknowledges the need for more physical activity but lacks the motivation to do so.  The patient has a strong family history of cardiac disease including her mother who had congestive heart failure and sudden death at age 83. Her brother has a pacemaker.   She had a cardiac stress test approximately eight to nine years ago due to chest and back pain, which resulted in a diagnosis of acid reflux. The patient is unsure if they have had an echocardiogram in the past.  In June or July of this year, the patient visited [facility] for suspected blood clots. A chest x-ray revealed a slightly enlarged heart, which the patient had not been previously informed of.   She notes constant swelling and is taking a fluid pill intermittently for the swelling. The patient has previously participated in a Healthy Weight and Wellness program but discontinued due to scheduling issues and has since regained the lost weight.  Otherwise she denies syncope. No orthopnea/PND. EKG is unremarkable  The 10-year ASCVD risk score (Arnett DK, et al., 2019) is: 9.7%   Values used to calculate the score:     Age: 89 years     Sex: Female     Is Non-Hispanic African American: Yes     Diabetic: Yes     Tobacco smoker: No     Systolic Blood Pressure: 132 mmHg     Is BP treated: No     HDL Cholesterol: 56 mg/dL     Total Cholesterol: 216 mg/dL   ROS:  per HPI; otherwise negative  Studies Reviewed: Marland Kitchen         EKG Interpretation Date/Time:  Thursday April 25 2023 08:10:03 EDT Ventricular Rate:  66 PR Interval:  124 QRS Duration:  74 QT Interval:  386 QTC Calculation: 404 R Axis:   38  Text Interpretation: Normal sinus rhythm with sinus arrhythmia Nonspecific T wave abnormality When compared with ECG of 17-May-2022 22:40, PREVIOUS ECG IS PRESENT Confirmed by Carolan Clines ([id number]) on 04/25/2023 8:18:44 AM         Risk Assessment/Calculations:     Physical Exam:    VS:  Vitals:   04/25/23 0813  BP: 132/74  Pulse: 66  SpO2: 97%     Wt Readings from Last 3 Encounters:  05/17/22 292 lb (132.5 kg)  12/18/21 296 lb (134.3 kg)  11/28/21 297 lb (134.7 kg)    GEN: Well nourished, well developed in no acute distress NECK: No JVD; No carotid bruits CARDIAC: RRR, no murmurs, rubs, gallops RESPIRATORY:  Clear to auscultation without rales, wheezing or rhonchi  ABDOMEN: Soft, non-tender, non-distended EXTREMITIES:  No edema; No deformity   ASSESSMENT AND PLAN: .   Non cardiac chest pain: Symptoms are atypical for coronary dx; no plans for an ischemic w/u  Family Hx -with hx of cardiomyopathy and PPM at young ages will get a baseline echo  HLD: -  tried statin medication and states it caused swelling - will try diet/exercise; on ozempic  DM2 -A1 c goal < 7 - on ozempic  Obesity -tried weight loss clinic   Plan:   TTE    Dispo: Follow up pending results  Signed, Makeya Hilgert, Alben Spittle, MD

## 2023-04-29 ENCOUNTER — Other Ambulatory Visit: Payer: 59

## 2023-05-01 ENCOUNTER — Other Ambulatory Visit: Payer: Self-pay | Admitting: Obstetrics and Gynecology

## 2023-05-01 DIAGNOSIS — D259 Leiomyoma of uterus, unspecified: Secondary | ICD-10-CM

## 2023-05-02 ENCOUNTER — Inpatient Hospital Stay: Admission: RE | Admit: 2023-05-02 | Payer: 59 | Source: Ambulatory Visit

## 2023-05-07 ENCOUNTER — Other Ambulatory Visit (HOSPITAL_COMMUNITY): Payer: Self-pay

## 2023-05-09 ENCOUNTER — Ambulatory Visit (HOSPITAL_COMMUNITY): Payer: 59 | Attending: Internal Medicine

## 2023-05-09 ENCOUNTER — Encounter (HOSPITAL_COMMUNITY): Payer: Self-pay

## 2023-05-13 ENCOUNTER — Encounter (HOSPITAL_COMMUNITY): Payer: Self-pay | Admitting: Internal Medicine

## 2023-05-17 ENCOUNTER — Other Ambulatory Visit: Payer: 59

## 2023-05-20 ENCOUNTER — Other Ambulatory Visit: Payer: Self-pay | Admitting: Gastroenterology

## 2023-06-01 ENCOUNTER — Encounter (HOSPITAL_BASED_OUTPATIENT_CLINIC_OR_DEPARTMENT_OTHER): Payer: Self-pay | Admitting: Emergency Medicine

## 2023-06-01 ENCOUNTER — Emergency Department (HOSPITAL_BASED_OUTPATIENT_CLINIC_OR_DEPARTMENT_OTHER)
Admission: EM | Admit: 2023-06-01 | Discharge: 2023-06-01 | Disposition: A | Discharge status: Home / Self Care | Payer: 59 | Attending: Emergency Medicine | Admitting: Emergency Medicine

## 2023-06-01 ENCOUNTER — Other Ambulatory Visit: Payer: Self-pay

## 2023-06-01 DIAGNOSIS — H18232 Secondary corneal edema, left eye: Secondary | ICD-10-CM | POA: Diagnosis present

## 2023-06-01 DIAGNOSIS — E119 Type 2 diabetes mellitus without complications: Secondary | ICD-10-CM | POA: Diagnosis not present

## 2023-06-01 DIAGNOSIS — H5789 Other specified disorders of eye and adnexa: Secondary | ICD-10-CM

## 2023-06-01 DIAGNOSIS — Z91040 Latex allergy status: Secondary | ICD-10-CM | POA: Diagnosis not present

## 2023-06-01 DIAGNOSIS — Z91010 Allergy to peanuts: Secondary | ICD-10-CM | POA: Insufficient documentation

## 2023-06-01 DIAGNOSIS — Z794 Long term (current) use of insulin: Secondary | ICD-10-CM | POA: Diagnosis not present

## 2023-06-01 MED ORDER — TETRACAINE HCL 0.5 % OP SOLN
2.0000 [drp] | Freq: Once | OPHTHALMIC | Status: AC
  Administered 2023-06-01: 2 [drp] via OPHTHALMIC
  Filled 2023-06-01: qty 4

## 2023-06-01 MED ORDER — FLUORESCEIN SODIUM 1 MG OP STRP
1.0000 | ORAL_STRIP | Freq: Once | OPHTHALMIC | Status: AC
  Administered 2023-06-01: 1 via OPHTHALMIC
  Filled 2023-06-01: qty 1

## 2023-06-01 MED ORDER — CETIRIZINE HCL 10 MG PO TABS: 10.0000 mg | ORAL_TABLET | Freq: Every day | ORAL | 0 refills | Status: AC | PRN

## 2023-06-01 MED ORDER — PROBIOTIC 1-250 BILLION-MG PO CAPS: 1.0000 | ORAL_CAPSULE | Freq: Every day | ORAL | 0 refills | Status: AC

## 2023-06-01 MED ORDER — ERYTHROMYCIN 5 MG/GM OP OINT: TOPICAL_OINTMENT | OPHTHALMIC | 0 refills | Status: AC

## 2023-06-01 NOTE — ED Triage Notes (Addendum)
Left eye swelling onset yesterday. Dx with possible cellulitis by another provider yesterday. Prescribed amoxicillin which she started yesterday. Endorses blurry vision in left eye, itching, and pain with palpation. Swelling progressed to lower lid which prompted patient to want to be re-evaluated.

## 2023-06-01 NOTE — ED Notes (Signed)
ED Provider at bedside. 

## 2023-06-01 NOTE — Discharge Instructions (Signed)
As discussed, we will continue to treat symptoms in the outpatient setting for infection of your upper eyelid.  Continue take oral antibiotics in the form of Augmentin.  You may take probiotic prescribed to help with diarrhea symptoms as well as get over-the-counter Imodium if diarrhea becomes excessive.  Will also send in antibiotic ointment.  Recommend use of allergy medicine such as Zyrtec for treatment of itching.  You may continue to use warm compresses over to help with drainage.  Recommend follow-up with primary care for reevaluation of your symptoms.  Please do not hesitate to return if redness worsens, fever, development of eye pain, pain with movement of the eye or other abnormality we discussed.

## 2023-06-01 NOTE — ED Provider Notes (Signed)
[place] [facility] Provider Note   CSN: [id number] Arrival date & time: 06/01/23  0947     History  Chief Complaint  Patient presents with   Eye Problem    Tanya Houston is a 57 y.o. female.  HPI   57 year old female presents emergency department with complaints of left upper eyelid swelling.  Patient states that symptoms are present since Tuesday and have slightly worsened since onset.  States initially noticed small raised area on her upper eyelid on the left side that was slightly red in appearance.  States that since then, redness has since spread to almost her entire upper left eyelid as well as some swelling to her left lower eyelid.  Was seen via televisit yesterday and diagnosed with preseptal cellulitis and placed on Augmentin.  States that she took antibiotics but noticed a little bit more swelling in her lower eyelid prompting visit to the emergency department today.  Patient also states that she was unaware of what the diagnosis of cellulitis was and is concerned of such diagnosis.  Denies any visual disturbance besides mechanical obstruction from eye swelling.  Denies any eye pain, foreign body sensation in eye.  Denies any pain with eye movements.  Denies contact lens use.  Denies any fever.  States that her left eye has been draining some "pus" from upper eyelid.  Has been performing warm compresses at home which have been helping symptoms.  Also describes left eyelid as itchy.  Past medical history significant for GERD, diabetes mellitus type 2, hyperlipidemia, obesity,  Home Medications Prior to Admission medications   Medication Sig Start Date End Date Taking? Authorizing Provider  Bacillus Coagulans-Inulin (PROBIOTIC) 1-250 BILLION-MG CAPS Take 1 capsule by mouth daily. 06/01/23  Yes Peter Garter, PA  cetirizine (ZYRTEC ALLERGY) 10 MG tablet Take 1 tablet (10 mg total) by mouth daily as needed for allergies (itch). 06/01/23   Yes Sherian Maroon A, PA  erythromycin ophthalmic ointment Place a 1/2 inch ribbon of ointment into the lower eyelid. 06/01/23  Yes Sherian Maroon A, PA  albuterol (VENTOLIN HFA) 108 (90 Base) MCG/ACT inhaler TAKE 2 PUFFS BY MOUTH EVERY 6 HOURS AS NEEDED FOR WHEEZE OR SHORTNESS OF BREATH Patient taking differently: Inhale 2 puffs into the lungs every 6 (six) hours as needed for wheezing or shortness of breath. 03/29/20   Burchette, Elberta Fortis, MD  Docusate Sodium (COLACE PO) Take by mouth.    [provider]  fluticasone (FLONASE) 50 MCG/ACT nasal spray Place 2 sprays into both nostrils daily. 11/07/18   [place], Betty G, MD  hydrOXYzine (ATARAX/VISTARIL) 25 MG tablet TAKE 1 TABLET (25 MG TOTAL) BY MOUTH 3 (THREE) TIMES DAILY AS NEEDED FOR ITCHING. 06/20/20   Burchette, Elberta Fortis, MD  Insulin Pen Needle (B-D UF III MINI PEN NEEDLES) 31G X 5 MM MISC USE 1 NEEDLE DAILY FOR VICTOZA INJECTIONS. DX CODE E11.65 02/26/20   Burchette, Elberta Fortis, MD  MAGNESIUM CITRATE PO Take by mouth.    [provider]  meloxicam (MOBIC) 15 MG tablet Take 15 mg by mouth daily. 03/06/21   [provider]  nystatin (MYCOSTATIN/NYSTOP) powder Apply 1 application topically 3 (three) times daily as needed (irritation).    [provider]  pantoprazole (PROTONIX) 40 MG tablet Take 40 mg by mouth daily. 02/25/21   [provider]  Semaglutide, 1 MG/DOSE, (OZEMPIC, 1 MG/DOSE,) 4 MG/3ML SOPN Inject 1 mg into the skin once a week. 02/22/22  Semaglutide, 1 MG/DOSE, 4 MG/3ML SOPN Inject 1 mg as directed once a week. 12/18/21   Langston Reusing, MD  Semaglutide, 2 MG/DOSE, (OZEMPIC, 2 MG/DOSE,) 8 MG/3ML SOPN Inject 2 mg into the skin every 7 (seven) days. 03/11/23     Vitamin D, Ergocalciferol, (DRISDOL) 1.25 MG (50000 UNIT) CAPS capsule Take 1 capsule (50,000 Units total) by mouth every 7 (seven) days. 12/18/21   Langston Reusing, MD      Allergies    Adhesive [tape], Cefuroxime, Fish allergy,  Latex, Amoxicillin, Metformin and related, Peanut oil, Sulfa antibiotics, Tirzepatide, Balsam, and Iodinated contrast media    Review of Systems   Review of Systems  All other systems reviewed and are negative.   Physical Exam Updated Vital Signs BP 128/82 (BP Location: Left Arm)   Pulse 66   Temp 98.1 F (36.7 C) (Oral)   Resp 17   Ht 5\' 6"  (1.676 m)   Wt 135.6 kg   SpO2 98%   BMI 48.26 kg/m  Physical Exam Vitals and nursing note reviewed.  Constitutional:      General: She is not in acute distress.    Appearance: She is well-developed.  HENT:     Head: Normocephalic and atraumatic.  Eyes:     Intraocular pressure: Left eye pressure is 18 mmHg.     Conjunctiva/sclera: Conjunctivae normal.     Comments: Eye not proptotic.  EOMs intact without pain or appreciable nystagmus.  PERRLA bilaterally.  Patient with swelling to upper and lower eyelid with erythematous appearing upper eyelid.  No obvious palpable mass/area of fluctuance.  Upper eyelid tender to touch.  Lower eyelid with swelling but without erythema.  Fluorescein exam showed no uptake.  Seidel sign negative.  No foreign body.  Visual acuity performed by nursing staff  Cardiovascular:     Rate and Rhythm: Normal rate and regular rhythm.     Heart sounds: No murmur heard. Pulmonary:     Effort: Pulmonary effort is normal. No respiratory distress.     Breath sounds: Normal breath sounds.  Abdominal:     Palpations: Abdomen is soft.     Tenderness: There is no abdominal tenderness.  Musculoskeletal:        General: No swelling.     Cervical back: Neck supple.  Skin:    General: Skin is warm and dry.     Capillary Refill: Capillary refill takes less than 2 seconds.  Neurological:     Mental Status: She is alert.  Psychiatric:        Mood and Affect: Mood normal.     ED Results / Procedures / Treatments   Labs (all labs ordered are listed, but only abnormal results are displayed) Labs Reviewed - No data to  display  EKG None  Radiology No results found.  Procedures Procedures    Medications Ordered in ED Medications  fluorescein ophthalmic strip 1 strip (1 strip Both Eyes Given by Other 06/01/23 1035)  tetracaine (PONTOCAINE) 0.5 % ophthalmic solution 2 drop (2 drops Both Eyes Given by Other 06/01/23 1035)    ED Course/ Medical Decision Making/ A&P                                 Medical Decision Making Risk Prescription drug management.   This patient presents to the ED for concern of eye swelling, this involves an extensive number of treatment options, and is a complaint  that carries with it a high risk of complications and morbidity.  The differential diagnosis includes preseptal/postseptal cellulitis, localized allergic reaction, viral illness, corneal abrasion, hyper pilon, other   Co morbidities that complicate the patient evaluation  See HPI   Additional history obtained:  Additional history obtained from EMR External records from outside source obtained and reviewed including hospital records   Lab Tests:  N/a   Imaging Studies ordered:  N/a   Cardiac Monitoring: / EKG:  The patient was maintained on a cardiac monitor.  I personally viewed and interpreted the cardiac monitored which showed an underlying rhythm of: Sinus rhythm   Consultations Obtained:  N/a   Problem List / ED Course / Critical interventions / Medication management  Eye swelling I ordered medication including tetracaine, fluorescein   Reevaluation of the patient after these medicines showed that the patient improved I have reviewed the patients home medicines and have made adjustments as needed   Social Determinants of Health:  Denies tobacco, licit drug use   Test / Admission - Considered:  Eye swelling Vitals signs within normal range and stable throughout visit. 57 year old female presents emergency department with complaints of left eyelid swelling.  Patient was  seen yesterday for presented emergency department due to concern of diagnosis of cellulitis without being explained exactly what it was as well as some progression of lower lid swelling.  On exam, patient with erythematous and swollen left upper eyelid with some swelling appreciated left lower eyelid without overlying erythema.  EOMs intact without pain.  Eye not proptotic in appearance.  Low suspicion for postseptal cellulitis.  Patient placed on Augmentin yesterday with 1 dose of antibiotic without clear failure of antibiotic therapy.  Eye exam most consistent with most likely preseptal cellulitis isolated to left upper eyelid.  Will recommend continued use of antibiotics orally in the outpatient setting as well as beginning a probiotic given patient's experience of diarrhea with Augmentin and several other antibiotic allergies making it difficult to alter antibiotic course given allergy to penicillin, cephalosporin, sulfa drugs.  Offered escalation of antibiotic therapy to clindamycin but patient declined due to known friend/family ember developing C. difficile from said antibiotic.  Will recommend close follow-up with primary care for reevaluation of symptoms and strict return precautions as discussed in AVS.  Treatment plan discussed at length with patient and she acknowledged understanding was agreeable to said plan.  Patient overall well-appearing, afebrile in no acute distress. Worrisome signs and symptoms were discussed with the patient, and the patient acknowledged understanding to return to the ED if noticed. Patient was stable upon discharge.         Final Clinical Impression(s) / ED Diagnoses Final diagnoses:  Eye swelling, left    Rx / DC Orders ED Discharge Orders          Ordered    erythromycin ophthalmic ointment        06/01/23 1100    cetirizine (ZYRTEC ALLERGY) 10 MG tablet  Daily PRN        06/01/23 1100    Bacillus Coagulans-Inulin (PROBIOTIC) 1-250 BILLION-MG CAPS  Daily         06/01/23 1100              Peter Garter, [place] 06/01/23 1253    Rolan Bucco, MD 06/01/23 1428

## 2023-06-03 ENCOUNTER — Other Ambulatory Visit (HOSPITAL_BASED_OUTPATIENT_CLINIC_OR_DEPARTMENT_OTHER): Payer: Self-pay

## 2023-06-03 ENCOUNTER — Encounter (HOSPITAL_BASED_OUTPATIENT_CLINIC_OR_DEPARTMENT_OTHER): Payer: Self-pay

## 2023-06-03 ENCOUNTER — Emergency Department (HOSPITAL_BASED_OUTPATIENT_CLINIC_OR_DEPARTMENT_OTHER)
Admission: EM | Admit: 2023-06-03 | Discharge: 2023-06-03 | Disposition: A | Discharge status: Home / Self Care | Payer: 59 | Attending: Emergency Medicine | Admitting: Emergency Medicine

## 2023-06-03 DIAGNOSIS — H0289 Other specified disorders of eyelid: Secondary | ICD-10-CM

## 2023-06-03 DIAGNOSIS — Z91010 Allergy to peanuts: Secondary | ICD-10-CM | POA: Insufficient documentation

## 2023-06-03 DIAGNOSIS — Z91040 Latex allergy status: Secondary | ICD-10-CM | POA: Insufficient documentation

## 2023-06-03 DIAGNOSIS — Z794 Long term (current) use of insulin: Secondary | ICD-10-CM | POA: Insufficient documentation

## 2023-06-03 DIAGNOSIS — L03213 Periorbital cellulitis: Secondary | ICD-10-CM | POA: Insufficient documentation

## 2023-06-03 DIAGNOSIS — R519 Headache, unspecified: Secondary | ICD-10-CM

## 2023-06-03 DIAGNOSIS — E119 Type 2 diabetes mellitus without complications: Secondary | ICD-10-CM | POA: Diagnosis not present

## 2023-06-03 DIAGNOSIS — R220 Localized swelling, mass and lump, head: Secondary | ICD-10-CM | POA: Diagnosis present

## 2023-06-03 LAB — BASIC METABOLIC PANEL
Anion gap: 13 (ref 5–15)
BUN: 10 mg/dL (ref 6–20)
CO2: 23 mmol/L (ref 22–32)
Calcium: 9 mg/dL (ref 8.9–10.3)
Chloride: 103 mmol/L (ref 98–111)
Creatinine, Ser: 0.83 mg/dL (ref 0.44–1.00)
GFR, Estimated: >60 mL/min (ref >60)
Glucose, Bld: 121 mg/dL — ABNORMAL HIGH (ref 70–99)
Potassium: 4.3 mmol/L (ref 3.5–5.1)
Sodium: 139 mmol/L (ref 135–145)

## 2023-06-03 LAB — CBC WITH DIFFERENTIAL/PLATELET
Abs Immature Granulocytes: 0.01 10*3/uL (ref 0.00–0.07)
Basophils Absolute: 0 10*3/uL (ref 0.0–0.1)
Basophils Relative: 1 %
Eosinophils Absolute: 0.2 10*3/uL (ref 0.0–0.5)
Eosinophils Relative: 2 %
HCT: 39.1 % (ref 36.0–46.0)
Hemoglobin: 12.1 g/dL (ref 12.0–15.0)
Immature Granulocytes: 0 %
Lymphocytes Relative: 45 %
Lymphs Abs: 2.9 10*3/uL (ref 0.7–4.0)
MCH: 24.9 pg — ABNORMAL LOW (ref 26.0–34.0)
MCHC: 30.9 g/dL (ref 30.0–36.0)
MCV: 80.6 fL (ref 80.0–100.0)
Monocytes Absolute: 0.4 10*3/uL (ref 0.1–1.0)
Monocytes Relative: 6 %
Neutro Abs: 2.9 10*3/uL (ref 1.7–7.7)
Neutrophils Relative %: 46 %
Platelets: 214 10*3/uL (ref 150–400)
RBC: 4.85 MIL/uL (ref 3.87–5.11)
RDW: 14.1 % (ref 11.5–15.5)
WBC: 6.3 10*3/uL (ref 4.0–10.5)
nRBC: 0 % (ref 0.0–0.2)

## 2023-06-03 MED ORDER — LINEZOLID 600 MG/300ML IV SOLN: 600.0000 mg | Freq: Once | INTRAVENOUS | Status: DC

## 2023-06-03 MED ORDER — LINEZOLID 600 MG PO TABS
600.0000 mg | ORAL_TABLET | Freq: Two times a day (BID) | ORAL | 0 refills | Status: AC
  Filled 2023-06-03: qty 20, 10d supply, fill #0

## 2023-06-03 MED ORDER — VANCOMYCIN HCL IN DEXTROSE 1-5 GM/200ML-% IV SOLN
1000.0000 mg | Freq: Once | INTRAVENOUS | Status: AC
  Administered 2023-06-03: 1000 mg via INTRAVENOUS
  Filled 2023-06-03: qty 200

## 2023-06-03 MED ORDER — SODIUM CHLORIDE 0.9 % IV SOLN: INTRAVENOUS | Status: DC | PRN

## 2023-06-03 MED ORDER — CLINDAMYCIN PHOSPHATE 900 MG/50ML IV SOLN
900.0000 mg | Freq: Once | INTRAVENOUS | Status: DC
  Filled 2023-06-03: qty 50

## 2023-06-03 NOTE — ED Provider Notes (Signed)
[place] [facility] Provider Note   CSN: [id number] Arrival date & time: 06/03/23  0725     History  Chief Complaint  Patient presents with   Eye Problem    Tanya Houston is a 57 y.o. female.  Patient here for reevaluation of swelling to her left eye.  She has been on Augmentin for the last few days with not much relief.  She is having some left-sided facial pain.  She has a history of diabetes.  She has follow-up with ophthalmologist later today.  She has been using erythromycin eye ointment as well.  She states swelling is getting worse may be now worse on the right side of the face as well and right eye.  She does have pain when she opens and closes her eyelids as well.  No fever.        Home Medications Prior to Admission medications   Medication Sig Start Date End Date Taking? Authorizing Provider  linezolid (ZYVOX) 600 MG tablet Take 1 tablet (600 mg total) by mouth 2 (two) times daily for 10 days. 06/03/23 06/13/23 Yes Liahm Grivas, DO  albuterol (VENTOLIN HFA) 108 (90 Base) MCG/ACT inhaler TAKE 2 PUFFS BY MOUTH EVERY 6 HOURS AS NEEDED FOR WHEEZE OR SHORTNESS OF BREATH Patient taking differently: Inhale 2 puffs into the lungs every 6 (six) hours as needed for wheezing or shortness of breath. 03/29/20   Burchette, Elberta Fortis, MD  Bacillus Coagulans-Inulin (PROBIOTIC) 1-250 BILLION-MG CAPS Take 1 capsule by mouth daily. 06/01/23   Peter Garter, PA  cetirizine (ZYRTEC ALLERGY) 10 MG tablet Take 1 tablet (10 mg total) by mouth daily as needed for allergies (itch). 06/01/23   Peter Garter, PA  Docusate Sodium (COLACE PO) Take by mouth.    [provider]  erythromycin ophthalmic ointment Place a 1/2 inch ribbon of ointment into the lower eyelid. 06/01/23   Sherian Maroon A, PA  fluticasone (FLONASE) 50 MCG/ACT nasal spray Place 2 sprays into both nostrils daily. 11/07/18   [place], Betty G, MD  hydrOXYzine (ATARAX/VISTARIL)  25 MG tablet TAKE 1 TABLET (25 MG TOTAL) BY MOUTH 3 (THREE) TIMES DAILY AS NEEDED FOR ITCHING. 06/20/20   Burchette, Elberta Fortis, MD  Insulin Pen Needle (B-D UF III MINI PEN NEEDLES) 31G X 5 MM MISC USE 1 NEEDLE DAILY FOR VICTOZA INJECTIONS. DX CODE E11.65 02/26/20   Burchette, Elberta Fortis, MD  MAGNESIUM CITRATE PO Take by mouth.    [provider]  meloxicam (MOBIC) 15 MG tablet Take 15 mg by mouth daily. 03/06/21   [provider]  nystatin (MYCOSTATIN/NYSTOP) powder Apply 1 application topically 3 (three) times daily as needed (irritation).    [provider]  pantoprazole (PROTONIX) 40 MG tablet Take 40 mg by mouth daily. 02/25/21   [provider]  Semaglutide, 1 MG/DOSE, (OZEMPIC, 1 MG/DOSE,) 4 MG/3ML SOPN Inject 1 mg into the skin once a week. 02/22/22     Semaglutide, 1 MG/DOSE, 4 MG/3ML SOPN Inject 1 mg as directed once a week. 12/18/21   Langston Reusing, MD  Semaglutide, 2 MG/DOSE, (OZEMPIC, 2 MG/DOSE,) 8 MG/3ML SOPN Inject 2 mg into the skin every 7 (seven) days. 03/11/23     Vitamin D, Ergocalciferol, (DRISDOL) 1.25 MG (50000 UNIT) CAPS capsule Take 1 capsule (50,000 Units total) by mouth every 7 (seven) days. 12/18/21   Langston Reusing, MD      Allergies    Adhesive [tape], Cefuroxime, Fish allergy,  Latex, Amoxicillin, Metformin and related, Peanut oil, Sulfa antibiotics, Tirzepatide, Balsam, and Iodinated contrast media    Review of Systems   Review of Systems  Physical Exam Updated Vital Signs BP 139/86 (BP Location: Right Arm)   Pulse 79   Temp 98.2 F (36.8 C) (Oral)   Resp 18   SpO2 98%  Physical Exam Vitals and nursing note reviewed.  Constitutional:      General: She is not in acute distress.    Appearance: She is well-developed.  HENT:     Head: Normocephalic and atraumatic.     Comments: Dental exam is unremarkable, there is no trismus, no drooling, no dental abscess    Right Ear: Tympanic membrane normal.     Left Ear: Tympanic  membrane normal.     Ears:     Comments: Tympanic membrane's are clear bilaterally Eyes:     Extraocular Movements: Extraocular movements intact.     Conjunctiva/sclera: Conjunctivae normal.     Pupils: Pupils are equal, round, and reactive to light.     Comments: Swelling mostly to the left upper eyelid, extraocular movements are intact, I do not appreciate any major facial swelling bilaterally or major swelling to the right eyelids  Cardiovascular:     Rate and Rhythm: Normal rate and regular rhythm.     Heart sounds: No murmur heard. Pulmonary:     Effort: Pulmonary effort is normal. No respiratory distress.     Breath sounds: Normal breath sounds.  Abdominal:     Palpations: Abdomen is soft.     Tenderness: There is no abdominal tenderness.  Musculoskeletal:        General: No swelling.     Cervical back: Neck supple.  Skin:    General: Skin is warm and dry.     Capillary Refill: Capillary refill takes less than 2 seconds.  Neurological:     General: No focal deficit present.     Mental Status: She is alert and oriented to person, place, and time.     Motor: No weakness.  Psychiatric:        Mood and Affect: Mood normal.     ED Results / Procedures / Treatments   Labs (all labs ordered are listed, but only abnormal results are displayed) Labs Reviewed  CBC WITH DIFFERENTIAL/PLATELET - Abnormal; Notable for the following components:      Result Value   MCH 24.9 (*)    All other components within normal limits  BASIC METABOLIC PANEL - Abnormal; Notable for the following components:   Glucose, Bld 121 (*)    All other components within normal limits  CULTURE, BLOOD (SINGLE)    EKG None  Radiology No results found.  Procedures Procedures    Medications Ordered in ED Medications  0.9 %  sodium chloride infusion ( Intravenous New Bag/Given 06/03/23 0816)  vancomycin (VANCOCIN) IVPB 1000 mg/200 mL premix (1,000 mg Intravenous New Bag/Given 06/03/23 0817)     And  vancomycin (VANCOCIN) IVPB 1000 mg/200 mL premix (1,000 mg Intravenous New Bag/Given 06/03/23 0906)    ED Course/ Medical Decision Making/ A&P                                 Medical Decision Making Amount and/or Complexity of Data Reviewed Labs: ordered.  Risk Prescription drug management.   Shekia Kuper is here for reevaluation of eye swelling.  Normal vitals.  No fever.  History  of diabetes.  Patient is well-appearing.  Swelling to the left eyelid mostly in the upper eyelids.  I do not notice any major swelling elsewhere including the right eye, facial area.  Do not see any dental abscess or dental swelling on exam.  No trismus or drooling.  Mostly this looks like a blepharitis in the left upper eyelid may be due to a stye that is developing maybe there is a small amount of preseptal cellulitis but I have no concern clinically for orbital cellulitis.  Patient does have follow-up with ophthalmologist later today.  She has been on Augmentin for the last few days.  She has been taking erythromycin ointment as well.  Spent about 48 hours on antibiotics.  She feels like things are getting better.  She is got normal vitals.  Have no concern for sepsis but patient requesting labs and culture and wants to be evaluated for MRSA.  There is no drainage from her eye and unable to collect any sort of drainage to evaluate for MRSA but will get basic labs and send off a blood culture but clinically at this time I have very low concern for sepsis.  I do not have any concern for orbital cellulitis or other major infectious process at this time but will talk with pharmacy about broaden out her antibiotic coverage given that she is got multiple drug allergies.  Do think she needs better coverage in case this is more of a preseptal cellulitis with oral antibiotics.  Although I continue to recommend warm compresses and topical antibiotic with erythromycin which she has been doing.  I talked with pharmacy and  they thought linezolid would be the best option given that it would hopefully not give her diarrhea or side effects like she has with other antibiotics.  Pharmacy did state that the IV and p.o. version are similar bioavailability and had a discussion with patient about this but patient does request an IV antibiotic and overall after talking with pharmacy will do dose IV vancomycin and then start linezolid p.o. in conjunction with her Augmentin and topical erythromycin.  She wants to be tested for MRSA but there is no drainage from the eye there is not really a specific place to really test for MRSA.  I tried to talk to her about this as well.  But will conservatively send her for blood culture although clinically I have no suspicion at this time for sepsis.  But will cover her to treat as a preseptal cellulitis.  Overall lab work is unremarkable per my review interpretation.  No significant leukocytosis or electrolyte abnormality or kidney injury.  Given normal white count normal vitals I have very low suspicion for sepsis.  She understands to take Augmentin and linezolid and follow-up with ophthalmology this afternoon.  Will have her continue to take topical Ortho mycin ointment as well.  Warm compresses as well.  Discharged in good condition.  Return precautions given.  This chart was dictated using voice recognition software.  Despite best efforts to proofread,  errors can occur which can change the documentation meaning.         Final Clinical Impression(s) / ED Diagnoses Final diagnoses:  Pain and swelling of eyelid  Facial pain  Preseptal cellulitis    Rx / DC Orders ED Discharge Orders          Ordered    linezolid (ZYVOX) 600 MG tablet  2 times daily        06/03/23 0810  Virgina Norfolk, DO 06/03/23 [id number]

## 2023-06-03 NOTE — Discharge Instructions (Addendum)
I am treating you for a skin infection around your eye.  Follow-up with your ophthalmologist today.  Tell them that you are taking Augmentin antibiotic as well as linezolid orally and taking topical erythromycin ointment.  Continue warm compresses.  Return if symptoms worsen.  If your blood culture is positive we will call you.

## 2023-06-03 NOTE — ED Notes (Signed)
ED Provider at bedside. 

## 2023-06-03 NOTE — ED Triage Notes (Signed)
Pt reports to ED with complaints of left eye swelling and pain. States that she was here on Saturday and that the infection/swelling is not any better. States that the pressure is getting worse causing her to have a headache.

## 2023-06-07 ENCOUNTER — Other Ambulatory Visit (HOSPITAL_COMMUNITY): Payer: Self-pay

## 2023-06-08 LAB — CULTURE, BLOOD (SINGLE)
Culture: NO GROWTH
Special Requests: ADEQUATE

## 2023-07-10 ENCOUNTER — Encounter (HOSPITAL_COMMUNITY): Payer: Self-pay | Admitting: Gastroenterology

## 2023-07-10 NOTE — Progress Notes (Signed)
Pre op call eval Name:Tanya Talitha Givens MD Cardiology- Branch MD  EKG-04/25/23 Echo-n/a Cath-n/a Stress-n/a ICD/PM- n/a Blood thinner-n/a GLP-1- Ozempic last dose 11/9, hold 7 days prior  YN:WGNFAO, GERD, Diff intubation, DM. Last saw cardiology 9/5, was having some chest pains/gerd and did discuss at that appt, ordered TTE, hasn't been done yet, denies any cardiac issues right now. Anesthesia Review: Yes

## 2023-07-11 ENCOUNTER — Telehealth: Payer: Self-pay | Admitting: *Deleted

## 2023-07-11 NOTE — Telephone Encounter (Signed)
   Pre-operative Risk Assessment    Patient Name: Tanya Houston  DOB: 01/18/1966 MRN: [id number]  DATE OF LAST VISIT: 04/25/23 DR. MARY BRANCH DATE OF NEXT VISIT: NONE  NOTES ON FORM STATES: NEEDS URGENT CLEARANCE OR ECG PER ANESTHESIOLOGY;   PT HAD EKG 04/25/23 WHEN SHE SAW DR[facility]    Request for Surgical Clearance    Procedure:   ENDOSCOPY/COLONOSCOPY  Date of Surgery:  Clearance 07/16/23                                 Surgeon:  DR. Levora Angel Surgeon's Group or Practice Name:  [facility] Phone number:  [phone] Fax number:  [phone]   Type of Clearance Requested:   - Medical ; NONE INDICATED TO BE HELD   Type of Anesthesia:   PROPOFOL   Additional requests/questions:    Tanya Houston   07/11/2023, 5:42 PM

## 2023-07-15 ENCOUNTER — Telehealth: Payer: Self-pay | Admitting: *Deleted

## 2023-07-15 NOTE — Telephone Encounter (Signed)
Pt has been scheduled tele pre op appt 08/12/23. Med rec and consent are done.

## 2023-07-15 NOTE — Telephone Encounter (Signed)
   Name: Areen Asaro  DOB: [birth year]  MRN: [id number]  Primary Cardiologist: None  Chart reviewed as part of pre-operative protocol coverage. Because of Tanya Houston's past medical history and time since last visit, she will require a follow-up telephone visit in order to better assess preoperative cardiovascular risk.  Pre-op covering staff: - Please schedule appointment and call patient to inform them. If patient already had an upcoming appointment within acceptable timeframe, please add "pre-op clearance" to the appointment notes so provider is aware. - Please contact requesting surgeon's office via preferred method (i.e, phone, fax) to inform them of need for appointment prior to surgery.  No medications indicated as needing held.  Sharlene Dory, PA-C  07/15/2023, 7:38 AM

## 2023-07-15 NOTE — Telephone Encounter (Signed)
Pt has been scheduled tele pre op appt 08/12/23. Med rec and consent are done.     Patient Consent for Virtual Visit        Tanya Houston has provided verbal consent on 07/15/2023 for a virtual visit (video or telephone).   CONSENT FOR VIRTUAL VISIT FOR:  Tanya Houston  By participating in this virtual visit I agree to the following:  I hereby voluntarily request, consent and authorize [facility] and its employed or contracted physicians, physician assistants, nurse practitioners or other licensed health care professionals (the Practitioner), to provide me with telemedicine health care services (the "Services") as deemed necessary by the treating Practitioner. I acknowledge and consent to receive the Services by the Practitioner via telemedicine. I understand that the telemedicine visit will involve communicating with the Practitioner through live audiovisual communication technology and the disclosure of certain medical information by electronic transmission. I acknowledge that I have been given the opportunity to request an in-person assessment or other available alternative prior to the telemedicine visit and am voluntarily participating in the telemedicine visit.  I understand that I have the right to withhold or withdraw my consent to the use of telemedicine in the course of my care at any time, without affecting my right to future care or treatment, and that the Practitioner or I may terminate the telemedicine visit at any time. I understand that I have the right to inspect all information obtained and/or recorded in the course of the telemedicine visit and may receive copies of available information for a reasonable fee.  I understand that some of the potential risks of receiving the Services via telemedicine include:  Delay or interruption in medical evaluation due to technological equipment failure or disruption; Information transmitted may not be sufficient (e.g. poor  resolution of images) to allow for appropriate medical decision making by the Practitioner; and/or  In rare instances, security protocols could fail, causing a breach of personal health information.  Furthermore, I acknowledge that it is my responsibility to provide information about my medical history, conditions and care that is complete and accurate to the best of my ability. I acknowledge that Practitioner's advice, recommendations, and/or decision may be based on factors not within their control, such as incomplete or inaccurate data provided by me or distortions of diagnostic images or specimens that may result from electronic transmissions. I understand that the practice of medicine is not an exact science and that Practitioner makes no warranties or guarantees regarding treatment outcomes. I acknowledge that a copy of this consent can be made available to me via my patient portal [facility] MyChart), or I can request a printed copy by calling the office of [facility].    I understand that my insurance will be billed for this visit.   I have read or had this consent read to me. I understand the contents of this consent, which adequately explains the benefits and risks of the Services being provided via telemedicine.  I have been provided ample opportunity to ask questions regarding this consent and the Services and have had my questions answered to my satisfaction. I give my informed consent for the services to be provided through the use of telemedicine in my medical care

## 2023-07-15 NOTE — Telephone Encounter (Signed)
Left message to call the office back ASAP as we will need to add pt on today for pre op tele appt per preop APP.  Procedure is tomorrow.   I will send FYI to the requesting office to keep them in the loop as well.

## 2023-07-15 NOTE — Telephone Encounter (Signed)
Patient is returning CMA's call stating she has rescheduled her procedure for 08/27/23.

## 2023-08-12 ENCOUNTER — Ambulatory Visit: Payer: 59 | Attending: Cardiology | Admitting: General Practice

## 2023-08-12 DIAGNOSIS — Z01810 Encounter for preprocedural cardiovascular examination: Secondary | ICD-10-CM | POA: Diagnosis not present

## 2023-08-12 NOTE — Progress Notes (Signed)
Virtual Visit via Telephone Note   Because of Tanya Houston's co-morbid illnesses, she is at least at moderate risk for complications without adequate follow up.  This format is felt to be most appropriate for this patient at this time.  The patient did not have access to video technology/had technical difficulties with video requiring transitioning to audio format only (telephone).  All issues noted in this document were discussed and addressed.  No physical exam could be performed with this format.  Please refer to the patient's chart for her consent to telehealth for [facility].  Evaluation Performed:  Preoperative cardiovascular risk assessment _____________   Date:  08/12/2023   Patient ID:  Tanya Houston, DOB 1965-08-21, MRN [id number] Patient Location:  Home Provider location:   Office  Primary Care Provider:  Tommi Rumps, MD Primary Cardiologist:  None  Chief Complaint / Patient Profile   57 y.o. y/o female with a h/o obesity, type II DM, chest pain of uncertain etiology who is pending colonoscopy and presents today for telephonic preoperative cardiovascular risk assessment.  History of Present Illness    Tanya Houston is a 57 y.o. female who presents via audio/video conferencing for a telehealth visit today.  Pt was last seen in cardiology clinic on 04/25/2023 by Dr. Carolan Clines.  At that time Tanya Houston was doing well .  The patient is now pending procedure as outlined above. Since her last visit, she remains stable from a cardiac standpoint.  Today she denies chest pain, shortness of breath, lower extremity edema, fatigue, palpitations, melena, hematuria, hemoptysis, diaphoresis, weakness, presyncope, syncope, orthopnea, and PND.   Past Medical History    Past Medical History:  Diagnosis Date   Anemia    Anxiety    panic attacks   Arthritis    Asthma    B12 deficiency    Back pain    Bilateral swelling of feet and ankles     Complication of anesthesia    difficulty breathing for a few a minutes after a procedure   Constipation    Difficult intubation    pt unsure    Environmental and seasonal allergies    Family history of adverse reaction to anesthesia    " my son hard a hard time waking up "   GERD (gastroesophageal reflux disease)    Headache    Helicobacter pylori gastritis    Hernia, abdominal    History of kidney stones    Hyperlipidemia    Irregular heartbeat    Joint pain    Other fatigue    Pneumonia    Recurrent sinus infections    Shortness of breath    Shortness of breath on exertion    Type II diabetes mellitus (HCC)    Vitamin D deficiency    Past Surgical History:  Procedure Laterality Date   APPENDECTOMY     BIOPSY ENDOMETRIAL     COLONOSCOPY W/ BIOPSIES AND POLYPECTOMY     ESOPHAGOGASTRODUODENOSCOPY (EGD) WITH PROPOFOL N/A 11/26/2016   Procedure: ESOPHAGOGASTRODUODENOSCOPY (EGD) WITH PROPOFOL;  Surgeon: Kathi Der, MD;  Location: [facility] ENDOSCOPY;  Service: Gastroenterology;  Laterality: N/A;   HYSTEROSCOPY WITH D & C N/A 04/05/2021   Procedure: DILATATION AND CURETTAGE /HYSTEROSCOPY;  Surgeon: Silverio Lay, MD;  Location: [facility] ORS;  Service: Gynecology;  Laterality: N/A;   ROTATOR CUFF REPAIR      Allergies  Allergies  Allergen Reactions   Adhesive [Tape] Hives    And Band Aids   Cefuroxime Diarrhea,  Hives and Itching   Fish Allergy Shortness Of Breath and Itching   Latex Hives   Amoxicillin     Other Reaction(s): Diarrhea   Metformin And Related Itching   Peanut Oil     Itchy, and sneezes    Sulfa Antibiotics Hives   Tirzepatide Itching   Balsam Rash    Positive Patch Test 10/25/22   Iodinated Contrast Media Itching and Rash    Possible SHOB with contrast administration, pt uncertain. Pt is unsure if it was to the barium or IV contrast    Home Medications    Prior to Admission medications   Medication Sig Start Date End Date Taking? Authorizing Provider   albuterol (VENTOLIN HFA) 108 (90 Base) MCG/ACT inhaler TAKE 2 PUFFS BY MOUTH EVERY 6 HOURS AS NEEDED FOR WHEEZE OR SHORTNESS OF BREATH Patient taking differently: Inhale 2 puffs into the lungs every 6 (six) hours as needed for wheezing or shortness of breath. 03/29/20   Burchette, Elberta Fortis, MD  Bacillus Coagulans-Inulin (PROBIOTIC) 1-250 BILLION-MG CAPS Take 1 capsule by mouth daily. 06/01/23   Peter Garter, PA  cetirizine (ZYRTEC ALLERGY) 10 MG tablet Take 1 tablet (10 mg total) by mouth daily as needed for allergies (itch). 06/01/23   Peter Garter, PA  Docusate Sodium (COLACE PO) Take by mouth.    [provider]  erythromycin ophthalmic ointment Place a 1/2 inch ribbon of ointment into the lower eyelid. 06/01/23   Sherian Maroon A, PA  fluticasone (FLONASE) 50 MCG/ACT nasal spray Place 2 sprays into both nostrils daily. 11/07/18   [place], Betty G, MD  hydrOXYzine (ATARAX/VISTARIL) 25 MG tablet TAKE 1 TABLET (25 MG TOTAL) BY MOUTH 3 (THREE) TIMES DAILY AS NEEDED FOR ITCHING. 06/20/20   Burchette, Elberta Fortis, MD  Insulin Pen Needle (B-D UF III MINI PEN NEEDLES) 31G X 5 MM MISC USE 1 NEEDLE DAILY FOR VICTOZA INJECTIONS. DX CODE E11.65 02/26/20   Burchette, Elberta Fortis, MD  MAGNESIUM CITRATE PO Take by mouth.    [provider]  meloxicam (MOBIC) 15 MG tablet Take 15 mg by mouth daily. 03/06/21   [provider]  nystatin (MYCOSTATIN/NYSTOP) powder Apply 1 application topically 3 (three) times daily as needed (irritation).    [provider]  pantoprazole (PROTONIX) 40 MG tablet Take 40 mg by mouth daily. 02/25/21   [provider]  Semaglutide, 1 MG/DOSE, (OZEMPIC, 1 MG/DOSE,) 4 MG/3ML SOPN Inject 1 mg into the skin once a week. 02/22/22     Semaglutide, 1 MG/DOSE, 4 MG/3ML SOPN Inject 1 mg as directed once a week. 12/18/21   Langston Reusing, MD  Semaglutide, 2 MG/DOSE, (OZEMPIC, 2 MG/DOSE,) 8 MG/3ML SOPN Inject 2 mg into the skin every 7 (seven) days.  03/11/23     Vitamin D, Ergocalciferol, (DRISDOL) 1.25 MG (50000 UNIT) CAPS capsule Take 1 capsule (50,000 Units total) by mouth every 7 (seven) days. 12/18/21   Langston Reusing, MD    Physical Exam    Vital Signs:  Tanya Houston does not have vital signs available for review today.  Given telephonic nature of communication, physical exam is limited. AAOx3. NAD. Normal affect.  Speech and respirations are unlabored.  Accessory Clinical Findings    None  Assessment & Plan    1.  Preoperative Cardiovascular Risk Assessment: ENDOSCOPY/COLONOSCOPY   Date of Surgery:  Clearance 07/16/23  Surgeon:  DR. Levora Angel Surgeon's Group or Practice Name:  [facility] Fax number:  [phone]    Primary Cardiologist: Dr. Wyline Mood  Chart reviewed as part of pre-operative protocol coverage. Given past medical history and time since last visit, based on ACC/AHA guidelines, Tanya Houston would be at acceptable risk for the planned procedure without further cardiovascular testing.   Patient was advised that if she develops new symptoms prior to surgery to contact our office to arrange a follow-up appointment.  He verbalized understanding.  I will route this recommendation to the requesting party via Epic fax function and remove from pre-op pool.       Time:   Today, I have spent 6 minutes with the patient with telehealth technology discussing medical history, symptoms, and management plan.     Ronney Asters, NP  08/12/2023, 7:46 AM    Prior to patient's phone evaluation I spent greater than 10 minutes reviewing their past medical history and cardiac medications.

## 2023-08-16 ENCOUNTER — Encounter (HOSPITAL_COMMUNITY): Payer: Self-pay | Admitting: Gastroenterology

## 2023-08-16 NOTE — Progress Notes (Signed)
Attempted to obtain medical history for pre op call via telephone, unable to reach at this time. HIPAA compliant voicemail message left requesting return call to pre surgical testing department.

## 2023-08-27 ENCOUNTER — Ambulatory Visit (HOSPITAL_COMMUNITY): Payer: 59 | Admitting: Registered Nurse

## 2023-08-27 ENCOUNTER — Other Ambulatory Visit: Payer: Self-pay

## 2023-08-27 ENCOUNTER — Encounter (HOSPITAL_COMMUNITY)
Admission: RE | Disposition: A | Discharge status: Home / Self Care | Payer: Self-pay | Source: Home / Self Care | Attending: Gastroenterology

## 2023-08-27 ENCOUNTER — Ambulatory Visit (HOSPITAL_COMMUNITY)
Admission: RE | Admit: 2023-08-27 | Discharge: 2023-08-27 | Disposition: A | Discharge status: Home / Self Care | Payer: 59 | Attending: Gastroenterology | Admitting: Gastroenterology

## 2023-08-27 DIAGNOSIS — K219 Gastro-esophageal reflux disease without esophagitis: Secondary | ICD-10-CM | POA: Diagnosis not present

## 2023-08-27 DIAGNOSIS — K5730 Diverticulosis of large intestine without perforation or abscess without bleeding: Secondary | ICD-10-CM | POA: Insufficient documentation

## 2023-08-27 DIAGNOSIS — Z6841 Body Mass Index (BMI) 40.0 and over, adult: Secondary | ICD-10-CM | POA: Diagnosis not present

## 2023-08-27 DIAGNOSIS — Z7985 Long-term (current) use of injectable non-insulin antidiabetic drugs: Secondary | ICD-10-CM | POA: Diagnosis not present

## 2023-08-27 DIAGNOSIS — K644 Residual hemorrhoidal skin tags: Secondary | ICD-10-CM | POA: Insufficient documentation

## 2023-08-27 DIAGNOSIS — E119 Type 2 diabetes mellitus without complications: Secondary | ICD-10-CM | POA: Insufficient documentation

## 2023-08-27 DIAGNOSIS — J45909 Unspecified asthma, uncomplicated: Secondary | ICD-10-CM | POA: Insufficient documentation

## 2023-08-27 DIAGNOSIS — E66813 Obesity, class 3: Secondary | ICD-10-CM | POA: Diagnosis not present

## 2023-08-27 DIAGNOSIS — Z79899 Other long term (current) drug therapy: Secondary | ICD-10-CM | POA: Insufficient documentation

## 2023-08-27 DIAGNOSIS — K2970 Gastritis, unspecified, without bleeding: Secondary | ICD-10-CM | POA: Diagnosis not present

## 2023-08-27 DIAGNOSIS — K5900 Constipation, unspecified: Secondary | ICD-10-CM | POA: Diagnosis not present

## 2023-08-27 DIAGNOSIS — Z794 Long term (current) use of insulin: Secondary | ICD-10-CM | POA: Diagnosis not present

## 2023-08-27 DIAGNOSIS — K648 Other hemorrhoids: Secondary | ICD-10-CM | POA: Diagnosis not present

## 2023-08-27 DIAGNOSIS — K5790 Diverticulosis of intestine, part unspecified, without perforation or abscess without bleeding: Secondary | ICD-10-CM | POA: Diagnosis not present

## 2023-08-27 DIAGNOSIS — Z8719 Personal history of other diseases of the digestive system: Secondary | ICD-10-CM | POA: Diagnosis not present

## 2023-08-27 DIAGNOSIS — R1310 Dysphagia, unspecified: Secondary | ICD-10-CM | POA: Insufficient documentation

## 2023-08-27 DIAGNOSIS — K2950 Unspecified chronic gastritis without bleeding: Secondary | ICD-10-CM | POA: Diagnosis not present

## 2023-08-27 DIAGNOSIS — K625 Hemorrhage of anus and rectum: Secondary | ICD-10-CM | POA: Diagnosis not present

## 2023-08-27 DIAGNOSIS — B9681 Helicobacter pylori [H. pylori] as the cause of diseases classified elsewhere: Secondary | ICD-10-CM | POA: Diagnosis not present

## 2023-08-27 DIAGNOSIS — M1990 Unspecified osteoarthritis, unspecified site: Secondary | ICD-10-CM | POA: Diagnosis not present

## 2023-08-27 DIAGNOSIS — Z800 Family history of malignant neoplasm of digestive organs: Secondary | ICD-10-CM | POA: Diagnosis not present

## 2023-08-27 HISTORY — PX: COLONOSCOPY WITH PROPOFOL: SHX5780

## 2023-08-27 HISTORY — PX: BIOPSY: SHX5522

## 2023-08-27 HISTORY — PX: ESOPHAGOGASTRODUODENOSCOPY (EGD) WITH PROPOFOL: SHX5813

## 2023-08-27 LAB — GLUCOSE, CAPILLARY
Glucose-Capillary: 108 mg/dL — ABNORMAL HIGH (ref 70–99)
Glucose-Capillary: 87 mg/dL (ref 70–99)

## 2023-08-27 SURGERY — COLONOSCOPY WITH PROPOFOL
Anesthesia: Monitor Anesthesia Care

## 2023-08-27 MED ORDER — DEXMEDETOMIDINE HCL IN NACL 80 MCG/20ML IV SOLN
INTRAVENOUS | Status: AC
  Filled 2023-08-27: qty 20

## 2023-08-27 MED ORDER — KETAMINE HCL 10 MG/ML IJ SOLN
INTRAMUSCULAR | Status: DC | PRN
Start: 2023-08-27 — End: 2023-08-27
  Administered 2023-08-27: 10 mg via INTRAVENOUS

## 2023-08-27 MED ORDER — DEXMEDETOMIDINE HCL IN NACL 200 MCG/50ML IV SOLN
INTRAVENOUS | Status: DC | PRN
Start: 2023-08-27 — End: 2023-08-27
  Administered 2023-08-27 (×4): 4 ug via INTRAVENOUS

## 2023-08-27 MED ORDER — MIDAZOLAM HCL 2 MG/2ML IJ SOLN
INTRAMUSCULAR | Status: AC
  Filled 2023-08-27: qty 2

## 2023-08-27 MED ORDER — PROPOFOL 500 MG/50ML IV EMUL
INTRAVENOUS | Status: DC | PRN
  Administered 2023-08-27: 50 ug/kg/min via INTRAVENOUS
  Administered 2023-08-27: 20 mg via INTRAVENOUS
  Administered 2023-08-27: 40 mg via INTRAVENOUS
  Administered 2023-08-27: 20 mg via INTRAVENOUS
  Administered 2023-08-27: 40 mg via INTRAVENOUS

## 2023-08-27 MED ORDER — PROPOFOL 1000 MG/100ML IV EMUL
INTRAVENOUS | Status: AC
  Filled 2023-08-27: qty 300

## 2023-08-27 MED ORDER — GLYCOPYRROLATE 0.2 MG/ML IJ SOLN
INTRAMUSCULAR | Status: DC | PRN
Start: 2023-08-27 — End: 2023-08-27
  Administered 2023-08-27 (×2): .1 mg via INTRAVENOUS

## 2023-08-27 MED ORDER — SODIUM CHLORIDE 0.9 % IV SOLN: INTRAVENOUS | Status: DC | PRN

## 2023-08-27 MED ORDER — MIDAZOLAM HCL 5 MG/5ML IJ SOLN
INTRAMUSCULAR | Status: DC | PRN
  Administered 2023-08-27: 2 mg via INTRAVENOUS

## 2023-08-27 MED ORDER — KETAMINE HCL 10 MG/ML IJ SOLN
INTRAMUSCULAR | Status: AC
  Filled 2023-08-27: qty 1

## 2023-08-27 SURGICAL SUPPLY — 23 items
BLOCK BITE 60FR ADLT L/F BLUE (MISCELLANEOUS) ×2 IMPLANT
ELECT REM PT RETURN 9FT ADLT (ELECTROSURGICAL)
ELECTRODE REM PT RTRN 9FT ADLT (ELECTROSURGICAL) IMPLANT
FCP BXJMBJMB 240X2.8X (CUTTING FORCEPS)
FLOOR PAD 36X40 (MISCELLANEOUS) ×2
FORCEP RJ3 GP 1.8X160 W-NEEDLE (CUTTING FORCEPS) IMPLANT
FORCEPS BIOP RAD 4 LRG CAP 4 (CUTTING FORCEPS) IMPLANT
FORCEPS BXJMBJMB 240X2.8X (CUTTING FORCEPS) IMPLANT
INJECTOR/SNARE I SNARE (MISCELLANEOUS) IMPLANT
LUBRICANT JELLY 4.5OZ STERILE (MISCELLANEOUS) IMPLANT
MANIFOLD NEPTUNE II (INSTRUMENTS) IMPLANT
NDL SCLEROTHERAPY 25GX240 (NEEDLE) IMPLANT
NEEDLE SCLEROTHERAPY 25GX240 (NEEDLE)
PAD FLOOR 36X40 (MISCELLANEOUS) ×2 IMPLANT
PROBE APC STR FIRE (PROBE) IMPLANT
PROBE INJECTION GOLD 7FR (MISCELLANEOUS) IMPLANT
SNARE ROTATE MED OVAL 20MM (MISCELLANEOUS) IMPLANT
SNARE SHORT THROW 13M SML OVAL (MISCELLANEOUS) IMPLANT
SYR 50ML LL SCALE MARK (SYRINGE) IMPLANT
TRAP SPECIMEN MUCOUS 40CC (MISCELLANEOUS) IMPLANT
TUBING ENDO SMARTCAP PENTAX (MISCELLANEOUS) ×4 IMPLANT
TUBING IRRIGATION ENDOGATOR (MISCELLANEOUS) ×2 IMPLANT
WATER STERILE IRR 1000ML POUR (IV SOLUTION) IMPLANT

## 2023-08-27 NOTE — Transfer of Care (Signed)
 Immediate Anesthesia Transfer of Care Note  Patient: Tanya Houston  Procedure(s) Performed: COLONOSCOPY WITH PROPOFOL  ESOPHAGOGASTRODUODENOSCOPY (EGD) WITH PROPOFOL  BIOPSY Balloon dilation wire-guided  Patient Location: PACU and Endoscopy Unit  Anesthesia Type:General  Level of Consciousness: awake, alert , and oriented  Airway & Oxygen Therapy: Patient Spontanous Breathing  Post-op Assessment: Report given to RN and Post -op Vital signs reviewed and stable  Post vital signs: Reviewed and stable  Last Vitals:  Vitals Value Taken Time  BP 144/88 08/27/23 0820  Temp 36.3 C 08/27/23 0820  Pulse 84 08/27/23 0827  Resp 19 08/27/23 0827  SpO2 99 % 08/27/23 0827  Vitals shown include unfiled device data.  Last Pain:  Vitals:   08/27/23 0820  TempSrc:   PainSc: 3          Complications: No notable events documented.

## 2023-08-27 NOTE — Op Note (Signed)
 [facility] Patient Name: Tanya Houston Procedure Date: 08/27/2023 MRN: [id number] Attending MD: Layla Lah , MD, [id number] Date of Birth: 1966/08/13 CSN: [id number] Age: 58 Admit Type: Outpatient Procedure:                Colonoscopy Indications:              Rectal bleeding, Follow-up of diverticulitis, F/H                            of colon cancer Providers:                Layla Lah, MD, Particia Fischer, RN, Corene Southgate, Technician Referring MD:             Layla Lah, MD Medicines:                 Complications:            No immediate complications. Estimated Blood Loss:     Estimated blood loss was minimal. Procedure:                Pre-Anesthesia Assessment:                           - Prior to the procedure, a History and Physical                            was performed, and patient medications and                            allergies were reviewed. The patient's tolerance of                            previous anesthesia was also reviewed. The risks                            and benefits of the procedure and the sedation                            options and risks were discussed with the patient.                            All questions were answered, and informed consent                            was obtained. Prior Anticoagulants: The patient has                            taken no anticoagulant or antiplatelet agents. ASA                            Grade Assessment: III - A patient with severe                            systemic  disease. After reviewing the risks and                            benefits, the patient was deemed in satisfactory                            condition to undergo the procedure.                           After obtaining informed consent, the colonoscope                            was passed under direct vision. Throughout the                            procedure, the patient's blood  pressure, pulse, and                            oxygen saturations were monitored continuously. The                            [id number]-HQ190L ([id number]) Olympus colonoscope was                            introduced through the anus and advanced to the the                            cecum, identified by appendiceal orifice and                            ileocecal valve. The colonoscopy was performed                            without difficulty. The patient tolerated the                            procedure well. The quality of the bowel                            preparation was adequate to identify polyps greater                            than 5 mm in size. The ileocecal valve, appendiceal                            orifice, and rectum were photographed. Scope In: 8:00:53 AM Scope Out: 8:12:06 AM Scope Withdrawal Time: 0 hours 7 minutes 49 seconds  Total Procedure Duration: 0 hours 11 minutes 13 seconds  Findings:      Skin tags were found on perianal exam.      Multiple small-mouthed diverticula were found in the sigmoid colon.      Internal hemorrhoids were found during retroflexion. The hemorrhoids       were small. Impression:               -  Perianal skin tags found on perianal exam.                           - Diverticulosis in the sigmoid colon.                           - Internal hemorrhoids.                           - No specimens collected. Moderate Sedation:      Moderate (conscious) sedation was personally administered by an       anesthesia professional. The following parameters were monitored: oxygen       saturation, heart rate, blood pressure, and response to care. Recommendation:           - Patient has a contact number available for                            emergencies. The signs and symptoms of potential                            delayed complications were discussed with the                            patient. Return to normal activities tomorrow.                             Written discharge instructions were provided to the                            patient.                           - Resume previous diet.                           - Continue present medications.                           - Repeat colonoscopy in 5 years for screening                            purposes.                           - Return to my office PRN. Procedure Code(s):        --- Professional ---                           [phone], Colonoscopy, flexible; diagnostic, including                            collection of specimen(s) by brushing or washing,                            when performed (separate procedure) Diagnosis Code(s):        --- Professional ---  K64.8, Other hemorrhoids                           K64.4, Residual hemorrhoidal skin tags                           K62.5, Hemorrhage of anus and rectum                           K57.32, Diverticulitis of large intestine without                            perforation or abscess without bleeding                           K57.30, Diverticulosis of large intestine without                            perforation or abscess without bleeding CPT copyright 2022 American Medical Association. All rights reserved. The codes documented in this report are preliminary and upon coder review may  be revised to meet current compliance requirements. Layla Lah, MD Layla Lah, MD 08/27/2023 8:42:28 AM Number of Addenda: 0

## 2023-08-27 NOTE — Anesthesia Preprocedure Evaluation (Addendum)
 Anesthesia Evaluation  Patient identified by MRN, date of birth, ID band Patient awake    Reviewed: Allergy & Precautions, NPO status , Patient's Chart, lab work & pertinent test results  Airway Mallampati: II  TM Distance: >3 FB Neck ROM: Full    Dental no notable dental hx.    Pulmonary asthma    Pulmonary exam normal        Cardiovascular negative cardio ROS Normal cardiovascular exam     Neuro/Psych  Headaches  Anxiety        GI/Hepatic Neg liver ROS,GERD  Medicated and Controlled,,  Endo/Other  diabetes, Insulin  Dependent  Class 3 obesityPatient on GLP-1 Agonist  Renal/GU negative Renal ROS     Musculoskeletal  (+) Arthritis ,    Abdominal  (+) + obese  Peds  Hematology negative hematology ROS (+)   Anesthesia Other Findings Globus sensation History of diverticulitis    Reproductive/Obstetrics                             Anesthesia Physical Anesthesia Plan  ASA: 3  Anesthesia Plan: MAC   Post-op Pain Management:    Induction:   PONV Risk Score and Plan: 2 and Propofol  infusion and Treatment may vary due to age or medical condition  Airway Management Planned: Simple Face Mask  Additional Equipment:   Intra-op Plan:   Post-operative Plan:   Informed Consent: I have reviewed the patients History and Physical, chart, labs and discussed the procedure including the risks, benefits and alternatives for the proposed anesthesia with the patient or authorized representative who has indicated his/her understanding and acceptance.       Plan Discussed with: CRNA  Anesthesia Plan Comments:         Anesthesia Quick Evaluation

## 2023-08-27 NOTE — Discharge Instructions (Signed)

## 2023-08-27 NOTE — H&P (Signed)
 Primary Care Physician:  Marsa Reyes Lenis, MD Primary Gastroenterologist:  Dr. Elicia  Reason for Visit : Outpatient EGD and colonoscopy  HPI: Tanya Houston is a 58 y.o. female with past medical history of morbid obesity and other conditions listed below here for outpatient EGD and colonoscopy.  She is complaining of intermittent trouble swallowing mostly solid food and pills.  EGD in 2018 showed small hiatal hernia and gastritis.  Biopsies were positive for H. pylori. She was treated with antibiotics which I believe she was not able to finish.  She is complaining of worsening constipation since starting Ozempic .  Sometimes no bowel movements for 2 weeks at a time. Occasional nausea which gets better with a bowel movement.  She is trying MiraLAX on a regular basis. Denies any blood in the stool or black stool.  Complaining of abdominal bloating with the constipation which gets better with a bowel movement.  Reflux is well-controlled with pantoprazole .   History of intermittent rectal bleeding in the past. Last bleeding episode was last year.  Was also treated for clinical diverticulitis last year with antibiotics.  Family history of colon cancer in father at age 53.  Last colonoscopy in [facility] 2 years ago.  Had several colonoscopies in the past.  Past Medical History:  Diagnosis Date   Anemia    Anxiety    panic attacks   Arthritis    Asthma    B12 deficiency    Back pain    Bilateral swelling of feet and ankles    Complication of anesthesia    difficulty breathing for a few a minutes after a procedure   Constipation    Difficult intubation    pt unsure    Environmental and seasonal allergies    Family history of adverse reaction to anesthesia     my son hard a hard time waking up    GERD (gastroesophageal reflux disease)    Headache    Helicobacter pylori gastritis    Hernia, abdominal    History of kidney stones    Hyperlipidemia    Irregular heartbeat     Joint pain    Other fatigue    Pneumonia    Recurrent sinus infections    Shortness of breath    Shortness of breath on exertion    Type II diabetes mellitus (HCC)    Vitamin D  deficiency     Past Surgical History:  Procedure Laterality Date   APPENDECTOMY     BIOPSY ENDOMETRIAL     COLONOSCOPY W/ BIOPSIES AND POLYPECTOMY     ESOPHAGOGASTRODUODENOSCOPY (EGD) WITH PROPOFOL  N/A 11/26/2016   Procedure: ESOPHAGOGASTRODUODENOSCOPY (EGD) WITH PROPOFOL ;  Surgeon: Layla Elicia, MD;  Location: [facility];  Service: Gastroenterology;  Laterality: N/A;   HYSTEROSCOPY WITH D & C N/A 04/05/2021   Procedure: DILATATION AND CURETTAGE /HYSTEROSCOPY;  Surgeon: Darcel Pool, MD;  Location: [facility];  Service: Gynecology;  Laterality: N/A;   ROTATOR CUFF REPAIR      Prior to Admission medications   Medication Sig Start Date End Date Taking? Authorizing Provider  Bacillus Coagulans-Inulin (PROBIOTIC) 1-250 BILLION-MG CAPS Take 1 capsule by mouth daily. 06/01/23  Yes Silver Fell A, PA  Docusate Sodium (COLACE PO) Take by mouth.   Yes [provider]  erythromycin  ophthalmic ointment Place a 1/2 inch ribbon of ointment into the lower eyelid. 06/01/23  Yes Silver Fell A, PA  fluticasone  (FLONASE ) 50 MCG/ACT nasal spray Place 2 sprays into both nostrils daily. 11/07/18  Yes Jordan,  Dickey MATSU, MD  hydrOXYzine  (ATARAX /VISTARIL ) 25 MG tablet TAKE 1 TABLET (25 MG TOTAL) BY MOUTH 3 (THREE) TIMES DAILY AS NEEDED FOR ITCHING. 06/20/20  Yes Burchette, Wolm ORN, MD  Insulin  Pen Needle (B-D UF III MINI PEN NEEDLES) 31G X 5 MM MISC USE 1 NEEDLE DAILY FOR VICTOZA  INJECTIONS. DX CODE E11.65 02/26/20  Yes Burchette, Wolm ORN, MD  MAGNESIUM CITRATE PO Take by mouth.   Yes [provider]  meloxicam (MOBIC) 15 MG tablet Take 15 mg by mouth daily. 03/06/21  Yes [provider]  pantoprazole  (PROTONIX ) 40 MG tablet Take 40 mg by mouth daily. 02/25/21  Yes [provider]  Vitamin D ,  Ergocalciferol , (DRISDOL ) 1.25 MG (50000 UNIT) CAPS capsule Take 1 capsule (50,000 Units total) by mouth every 7 (seven) days. 12/18/21  Yes Berkeley Adelita PENNER, MD  albuterol  (VENTOLIN  HFA) [phone] Base) MCG/ACT inhaler TAKE 2 PUFFS BY MOUTH EVERY 6 HOURS AS NEEDED FOR WHEEZE OR SHORTNESS OF BREATH Patient taking differently: Inhale 2 puffs into the lungs every 6 (six) hours as needed for wheezing or shortness of breath. 03/29/20   Burchette, Wolm ORN, MD  cetirizine  (ZYRTEC  ALLERGY) 10 MG tablet Take 1 tablet (10 mg total) by mouth daily as needed for allergies (itch). 06/01/23   Silver Wonda LABOR, PA  nystatin  (MYCOSTATIN /NYSTOP ) powder Apply 1 application topically 3 (three) times daily as needed (irritation).    [provider]  Semaglutide , 1 MG/DOSE, (OZEMPIC , 1 MG/DOSE,) 4 MG/3ML SOPN Inject 1 mg into the skin once a week. 02/22/22     Semaglutide , 1 MG/DOSE, 4 MG/3ML SOPN Inject 1 mg as directed once a week. 12/18/21   Berkeley Adelita PENNER, MD  Semaglutide , 2 MG/DOSE, (OZEMPIC , 2 MG/DOSE,) 8 MG/3ML SOPN Inject 2 mg into the skin every 7 (seven) days. 03/11/23       Scheduled Meds: Continuous Infusions: PRN Meds:.  Allergies as of 05/20/2023 - Review Complete 12/18/2021  Allergen Reaction Noted   Adhesive [tape] Hives 11/23/2016   Cefuroxime Diarrhea, Hives, and Itching 07/18/2022   Fish allergy Shortness Of Breath and Itching 04/21/2014   Latex Hives 11/23/2016   Metformin  and related Itching 03/09/2020   Peanut oil     Sulfa antibiotics Hives 11/23/2016   Tirzepatide Itching 04/10/2022   Balsam Rash 10/25/2022   Iodinated contrast media Itching and Rash 02/28/2023    Family History  Problem Relation Age of Onset   Obesity Mother    Sudden death Mother    Heart disease Mother    Diabetes Mother    Heart attack Mother    Alcoholism Father    Cancer Father    Colon cancer Father    Cervical cancer Sister    Diabetes Sister    Heart attack Sister    Epilepsy Brother     Heart disease Other    Sudden death Other    Alcoholism Other    Obesity Other     Social History   Socioeconomic History   Marital status: Married    Spouse name: dwight   Number of children: Not on file   Years of education: Not on file   Highest education level: Not on file  Occupational History   Occupation: Patient representative    Employer: [employer]  Tobacco Use   Smoking status: Never   Smokeless tobacco: Never  Vaping Use   Vaping status: Never Used  Substance and Sexual Activity   Alcohol use: Not Currently  Comment: occ   Drug use: No   Sexual activity: Not Currently    Partners: Male    Birth control/protection: None  Other Topics Concern   Not on file  Social History Narrative   Not on file   Social Drivers of Health   Financial Resource Strain: Not on file  Food Insecurity: Low Risk  (02/28/2023)   Received from [facility]   Hunger Vital Sign    Worried About Running Out of Food in the Last Year: Never true    Ran Out of Food in the Last Year: Never true  Transportation Needs: Not on file (02/28/2023)  Physical Activity: Not on file  Stress: Not on file  Social Connections: Not on file  Intimate Partner Violence: Not on file    Review of Systems: All negative except as stated above in HPI.  Physical Exam: Vital signs: Vitals:   08/27/23 0701  BP: (!) 147/90  Pulse: 86  Resp: 18  Temp: (!) 97.2 F (36.2 C)  SpO2: 98%     General:   Morbidly obese, not in acute distress Lungs: NO Visible respiratory distress Heart:  Regular rate and rhythm; no murmurs, clicks, rubs,  or gallops. Abdomen: Obese abdomen difficult examination but otherwise no abdominal tenderness, abdomen is soft, bowel sound present, no peritoneal signs Rectal:  Deferred  GI:  Lab Results: No results for input(s): WBC, HGB, HCT, PLT in the last 72 hours. BMET No results for input(s): NA, K, CL, CO2, GLUCOSE, BUN,  CREATININE, CALCIUM in the last 72 hours. LFT No results for input(s): PROT, ALBUMIN, AST, ALT, ALKPHOS, BILITOT, BILIDIR, IBILI in the last 72 hours. PT/INR No results for input(s): LABPROT, INR in the last 72 hours.   Studies/Results: No results found.  Impression/Plan: -Esophageal dysphagia -History of H. pylori infection -Rectal bleeding -History of diverticulitis -Family history of colon cancer  Recommendations -------------------------- -Proceed with EGD with dilation and colonoscopy today.  Risks (bleeding, infection, bowel perforation that could require surgery, sedation-related changes in cardiopulmonary systems), benefits (identification and possible treatment of source of symptoms, exclusion of certain causes of symptoms), and alternatives (watchful waiting, radiographic imaging studies, empiric medical treatment)  were explained to patient/family in detail and patient wishes to proceed.     LOS: 0 days   Layla Lah  MD, FACP 08/27/2023, 7:17 AM  Contact #  [phone]

## 2023-08-27 NOTE — Op Note (Signed)
 [facility] Patient Name: Tanya Houston Procedure Date: 08/27/2023 MRN: [id number] Attending MD: Layla Lah , MD, [id number] Date of Birth: 29-Nov-1965 CSN: [id number] Age: 58 Admit Type: Outpatient Procedure:                Upper GI endoscopy Indications:              Dysphagia, Follow-up of Helicobacter pylori Providers:                Layla Lah, MD, Particia Fischer, RN, Corene Southgate, Technician Referring MD:             Layla Lah, MD Medicines:                Sedation Administered by an Anesthesia Professional Complications:            No immediate complications. Estimated Blood Loss:     Estimated blood loss was minimal. Procedure:                Pre-Anesthesia Assessment:                           - Prior to the procedure, a History and Physical                            was performed, and patient medications and                            allergies were reviewed. The patient's tolerance of                            previous anesthesia was also reviewed. The risks                            and benefits of the procedure and the sedation                            options and risks were discussed with the patient.                            All questions were answered, and informed consent                            was obtained. Prior Anticoagulants: The patient has                            taken no anticoagulant or antiplatelet agents. ASA                            Grade Assessment: III - A patient with severe                            systemic disease. After reviewing the risks and  benefits, the patient was deemed in satisfactory                            condition to undergo the procedure.                           After obtaining informed consent, the endoscope was                            passed under direct vision. Throughout the                            procedure, the patient's  blood pressure, pulse, and                            oxygen saturations were monitored continuously. The                            [id number]-[id number] ([id number]) Olympus endoscope was introduced                            through the mouth, and advanced to the second part                            of duodenum. The upper GI endoscopy was                            accomplished without difficulty. The patient                            tolerated the procedure well. Scope In: Scope Out: Findings:      The Z-line was regular and was found 38 cm from the incisors.      No endoscopic abnormality was evident in the esophagus to explain the       patient's complaint of dysphagia. It was decided, however, to proceed       with dilation in the distal esophagus. A TTS dilator was passed through       the scope. Dilation with a 15-16.5-18 mm balloon dilator was performed       to 18 mm. The dilation site was examined following endoscope reinsertion       and showed no bleeding, mucosal tear or perforation.      Scattered mild inflammation characterized by congestion (edema) and       erythema was found in the gastric body, in the gastric antrum and in the       prepyloric region of the stomach. Biopsies were taken with a cold       forceps for histology.      The cardia and gastric fundus were normal on retroflexion.      The duodenal bulb, first portion of the duodenum and second portion of       the duodenum were normal. Impression:               - Z-line regular, 38 cm from the incisors.                           -  No endoscopic esophageal abnormality to explain                            patient's dysphagia. Esophagus dilated. Dilated.                           - Chronic gastritis. Biopsied.                           - Normal duodenal bulb, first portion of the                            duodenum and second portion of the duodenum. Moderate Sedation:      Moderate (conscious) sedation was personally  administered by an       anesthesia professional. The following parameters were monitored: oxygen       saturation, heart rate, blood pressure, and response to care. Recommendation:           - Perform a colonoscopy today. Procedure Code(s):        --- Professional ---                           [phone], Esophagogastroduodenoscopy, flexible,                            transoral; with transendoscopic balloon dilation of                            esophagus (less than 30 mm diameter)                           [id number], 59, Esophagogastroduodenoscopy, flexible,                            transoral; with biopsy, single or multiple Diagnosis Code(s):        --- Professional ---                           R13.10, Dysphagia, unspecified                           K29.50, Unspecified chronic gastritis without                            bleeding                           B96.81, Helicobacter pylori [H. pylori] as the                            cause of diseases classified elsewhere CPT copyright 2022 [employer]. All rights reserved. The codes documented in this report are preliminary and upon coder review may  be revised to meet current compliance requirements. Layla Lah, MD Layla Lah, MD 08/27/2023 8:37:36 AM Number of Addenda: 0

## 2023-08-28 NOTE — Anesthesia Postprocedure Evaluation (Signed)
 Anesthesia Post Note  Patient: Tanya Houston  Procedure(s) Performed: COLONOSCOPY WITH PROPOFOL  ESOPHAGOGASTRODUODENOSCOPY (EGD) WITH PROPOFOL  BIOPSY Balloon dilation wire-guided     Patient location during evaluation: Endoscopy Anesthesia Type: MAC Level of consciousness: awake Pain management: pain level controlled Vital Signs Assessment: post-procedure vital signs reviewed and stable Respiratory status: spontaneous breathing, nonlabored ventilation and respiratory function stable Cardiovascular status: blood pressure returned to baseline and stable Postop Assessment: no apparent nausea or vomiting Anesthetic complications: no   No notable events documented.  Last Vitals:  Vitals:   08/27/23 0840 08/27/23 0850  BP: (!) 146/94 (!) 150/87  Pulse: 81 83  Resp: 17 15  Temp:    SpO2: 97% 92%    Last Pain:  Vitals:   08/27/23 0840  TempSrc:   PainSc: 3    Pain Goal:                   Bernardino [name] Cassia Fein

## 2023-08-29 ENCOUNTER — Encounter (HOSPITAL_COMMUNITY): Payer: Self-pay | Admitting: Gastroenterology

## 2023-08-29 LAB — SURGICAL PATHOLOGY

## 2023-09-21 ENCOUNTER — Other Ambulatory Visit (HOSPITAL_COMMUNITY): Payer: Self-pay

## 2023-09-25 ENCOUNTER — Other Ambulatory Visit (HOSPITAL_COMMUNITY): Payer: Self-pay

## 2023-09-25 MED ORDER — SEMAGLUTIDE (1 MG/DOSE) 4 MG/3ML ~~LOC~~ SOPN
1.0000 mg | PEN_INJECTOR | SUBCUTANEOUS | 8 refills | Status: DC
  Filled 2023-09-25: qty 3, 28d supply, fill #0
  Filled 2023-10-21: qty 3, 28d supply, fill #1
  Filled 2023-11-16: qty 3, 28d supply, fill #2
  Filled 2023-12-28: qty 3, 28d supply, fill #3

## 2023-09-26 ENCOUNTER — Other Ambulatory Visit (HOSPITAL_COMMUNITY): Payer: Self-pay

## 2023-10-21 ENCOUNTER — Other Ambulatory Visit (HOSPITAL_COMMUNITY): Payer: Self-pay

## 2023-12-28 ENCOUNTER — Other Ambulatory Visit (HOSPITAL_COMMUNITY): Payer: Self-pay

## 2024-01-02 ENCOUNTER — Other Ambulatory Visit (HOSPITAL_COMMUNITY): Payer: Self-pay

## 2024-01-02 MED ORDER — WEGOVY 1.7 MG/0.75ML ~~LOC~~ SOAJ
1.7000 mg | SUBCUTANEOUS | 3 refills | Status: DC
Start: 2024-01-02 — End: 2024-05-21
  Filled 2024-01-02 – 2024-01-03 (×2): qty 3, 28d supply, fill #0

## 2024-01-03 ENCOUNTER — Other Ambulatory Visit (HOSPITAL_COMMUNITY): Payer: Self-pay

## 2024-01-18 ENCOUNTER — Other Ambulatory Visit (HOSPITAL_COMMUNITY): Payer: Self-pay

## 2024-01-20 ENCOUNTER — Other Ambulatory Visit (HOSPITAL_COMMUNITY): Payer: Self-pay

## 2024-01-22 ENCOUNTER — Other Ambulatory Visit (HOSPITAL_COMMUNITY): Payer: Self-pay

## 2024-01-22 MED ORDER — SEMAGLUTIDE (2 MG/DOSE) 8 MG/3ML ~~LOC~~ SOPN
2.0000 mg | PEN_INJECTOR | SUBCUTANEOUS | 3 refills | Status: DC
  Filled 2024-01-22 – 2024-01-25 (×2): qty 3, 28d supply, fill #0
  Filled 2024-02-26: qty 3, 28d supply, fill #1
  Filled 2024-03-20: qty 3, 28d supply, fill #2
  Filled 2024-04-25: qty 3, 28d supply, fill #3

## 2024-01-23 ENCOUNTER — Other Ambulatory Visit (HOSPITAL_COMMUNITY): Payer: Self-pay

## 2024-01-25 ENCOUNTER — Other Ambulatory Visit (HOSPITAL_COMMUNITY): Payer: Self-pay

## 2024-01-30 ENCOUNTER — Other Ambulatory Visit (HOSPITAL_COMMUNITY): Payer: Self-pay

## 2024-02-26 ENCOUNTER — Other Ambulatory Visit (HOSPITAL_COMMUNITY): Payer: Self-pay

## 2024-03-16 ENCOUNTER — Encounter: Payer: Self-pay | Admitting: Obstetrics and Gynecology

## 2024-04-03 ENCOUNTER — Other Ambulatory Visit: Payer: Self-pay | Admitting: Obstetrics and Gynecology

## 2024-04-03 DIAGNOSIS — Z1231 Encounter for screening mammogram for malignant neoplasm of breast: Secondary | ICD-10-CM

## 2024-04-06 ENCOUNTER — Ambulatory Visit
Admission: RE | Admit: 2024-04-06 | Discharge: 2024-04-06 | Disposition: A | Discharge status: Home / Self Care | Source: Ambulatory Visit | Attending: Obstetrics and Gynecology | Admitting: Obstetrics and Gynecology

## 2024-04-06 DIAGNOSIS — Z1231 Encounter for screening mammogram for malignant neoplasm of breast: Secondary | ICD-10-CM

## 2024-04-09 ENCOUNTER — Ambulatory Visit (HOSPITAL_COMMUNITY)
Admission: RE | Admit: 2024-04-09 | Discharge: 2024-04-09 | Disposition: A | Discharge status: Home / Self Care | Source: Ambulatory Visit | Attending: Obstetrics and Gynecology | Admitting: Obstetrics and Gynecology

## 2024-04-09 DIAGNOSIS — D259 Leiomyoma of uterus, unspecified: Secondary | ICD-10-CM | POA: Diagnosis present

## 2024-04-25 ENCOUNTER — Other Ambulatory Visit (HOSPITAL_COMMUNITY): Payer: Self-pay

## 2024-05-27 ENCOUNTER — Other Ambulatory Visit (HOSPITAL_COMMUNITY): Payer: Self-pay

## 2024-05-28 ENCOUNTER — Other Ambulatory Visit (HOSPITAL_COMMUNITY): Payer: Self-pay

## 2024-05-28 MED ORDER — SEMAGLUTIDE (2 MG/DOSE) 8 MG/3ML ~~LOC~~ SOPN
2.0000 mg | PEN_INJECTOR | SUBCUTANEOUS | 3 refills | Status: DC
  Filled 2024-05-28 (×2): qty 3, 28d supply, fill #0
  Filled 2024-06-20: qty 3, 28d supply, fill #1
  Filled 2024-07-30: qty 3, 28d supply, fill #2
  Filled 2024-08-22: qty 3, 28d supply, fill #3

## 2024-06-26 ENCOUNTER — Other Ambulatory Visit (HOSPITAL_COMMUNITY): Payer: Self-pay

## 2024-07-30 ENCOUNTER — Other Ambulatory Visit (HOSPITAL_COMMUNITY): Payer: Self-pay

## 2024-07-31 ENCOUNTER — Other Ambulatory Visit (HOSPITAL_COMMUNITY): Payer: Self-pay

## 2024-08-24 ENCOUNTER — Other Ambulatory Visit (HOSPITAL_COMMUNITY): Payer: Self-pay

## 2024-09-02 ENCOUNTER — Other Ambulatory Visit (HOSPITAL_COMMUNITY): Payer: Self-pay

## 2024-09-20 ENCOUNTER — Other Ambulatory Visit (HOSPITAL_COMMUNITY): Payer: Self-pay

## 2024-09-22 ENCOUNTER — Other Ambulatory Visit: Payer: Self-pay

## 2024-09-22 ENCOUNTER — Other Ambulatory Visit (HOSPITAL_COMMUNITY): Payer: Self-pay

## 2024-09-22 MED ORDER — SEMAGLUTIDE (2 MG/DOSE) 8 MG/3ML ~~LOC~~ SOPN
2.0000 mg | PEN_INJECTOR | SUBCUTANEOUS | 3 refills | Status: DC
  Filled 2024-09-22: qty 3, 28d supply, fill #0

## 2024-09-23 ENCOUNTER — Other Ambulatory Visit (HOSPITAL_COMMUNITY): Payer: Self-pay

## 2024-09-23 MED ORDER — OZEMPIC (1 MG/DOSE) 4 MG/3ML ~~LOC~~ SOPN
1.0000 mg | PEN_INJECTOR | SUBCUTANEOUS | 6 refills | Status: AC
  Filled 2024-09-23: qty 3, 28d supply, fill #0

## 2024-09-24 ENCOUNTER — Other Ambulatory Visit (HOSPITAL_COMMUNITY): Payer: Self-pay
